# Patient Record
Sex: Male | Born: 1950 | Race: Asian | Hispanic: No | Marital: Married | Smoking: Former smoker
Health system: Southern US, Community
[De-identification: ages and names within clinical notes are randomized; demographics above are authoritative.]

## PROBLEM LIST (undated history)

## (undated) DIAGNOSIS — J40 Bronchitis, not specified as acute or chronic: Secondary | ICD-10-CM

## (undated) DIAGNOSIS — K219 Gastro-esophageal reflux disease without esophagitis: Secondary | ICD-10-CM

## (undated) DIAGNOSIS — I10 Essential (primary) hypertension: Secondary | ICD-10-CM

## (undated) DIAGNOSIS — H269 Unspecified cataract: Secondary | ICD-10-CM

## (undated) DIAGNOSIS — E785 Hyperlipidemia, unspecified: Secondary | ICD-10-CM

## (undated) DIAGNOSIS — J45909 Unspecified asthma, uncomplicated: Secondary | ICD-10-CM

## (undated) DIAGNOSIS — F419 Anxiety disorder, unspecified: Secondary | ICD-10-CM

## (undated) DIAGNOSIS — J449 Chronic obstructive pulmonary disease, unspecified: Secondary | ICD-10-CM

## (undated) DIAGNOSIS — E119 Type 2 diabetes mellitus without complications: Secondary | ICD-10-CM

## (undated) DIAGNOSIS — T7840XA Allergy, unspecified, initial encounter: Secondary | ICD-10-CM

## (undated) HISTORY — DX: Anxiety disorder, unspecified: F41.9

## (undated) HISTORY — PX: COLONOSCOPY: SHX174

## (undated) HISTORY — PX: POLYPECTOMY: SHX149

## (undated) HISTORY — DX: Unspecified cataract: H26.9

## (undated) HISTORY — PX: UPPER GASTROINTESTINAL ENDOSCOPY: SHX188

## (undated) HISTORY — DX: Bronchitis, not specified as acute or chronic: J40

## (undated) HISTORY — DX: Gastro-esophageal reflux disease without esophagitis: K21.9

## (undated) HISTORY — DX: Chronic obstructive pulmonary disease, unspecified: J44.9

## (undated) HISTORY — DX: Hyperlipidemia, unspecified: E78.5

## (undated) HISTORY — DX: Allergy, unspecified, initial encounter: T78.40XA

## (undated) HISTORY — DX: Unspecified asthma, uncomplicated: J45.909

---

## 2011-03-22 HISTORY — PX: CYST REMOVAL NECK: SHX6281

## 2012-06-11 ENCOUNTER — Ambulatory Visit
Admission: RE | Admit: 2012-06-11 | Discharge: 2012-06-11 | Disposition: A | Discharge status: Home / Self Care | Payer: No Typology Code available for payment source | Source: Ambulatory Visit | Attending: Infectious Diseases | Admitting: Infectious Diseases

## 2012-06-11 ENCOUNTER — Other Ambulatory Visit: Payer: Self-pay | Admitting: Infectious Diseases

## 2012-06-11 DIAGNOSIS — R7611 Nonspecific reaction to tuberculin skin test without active tuberculosis: Secondary | ICD-10-CM

## 2012-11-08 ENCOUNTER — Ambulatory Visit: Payer: Self-pay | Attending: Family Medicine

## 2012-11-23 ENCOUNTER — Ambulatory Visit: Payer: Self-pay

## 2012-11-23 ENCOUNTER — Ambulatory Visit: Payer: Self-pay | Attending: Internal Medicine | Admitting: Internal Medicine

## 2012-11-23 ENCOUNTER — Encounter: Payer: Self-pay | Admitting: Internal Medicine

## 2012-11-23 VITALS — BP 120/79 | HR 79 | Temp 97.7°F | Resp 15

## 2012-11-23 DIAGNOSIS — IMO0001 Reserved for inherently not codable concepts without codable children: Secondary | ICD-10-CM | POA: Insufficient documentation

## 2012-11-23 DIAGNOSIS — J449 Chronic obstructive pulmonary disease, unspecified: Secondary | ICD-10-CM

## 2012-11-23 DIAGNOSIS — M545 Low back pain: Secondary | ICD-10-CM | POA: Insufficient documentation

## 2012-11-23 DIAGNOSIS — R030 Elevated blood-pressure reading, without diagnosis of hypertension: Secondary | ICD-10-CM | POA: Insufficient documentation

## 2012-11-23 DIAGNOSIS — F17200 Nicotine dependence, unspecified, uncomplicated: Secondary | ICD-10-CM | POA: Insufficient documentation

## 2012-11-23 LAB — POCT GLYCOSYLATED HEMOGLOBIN (HGB A1C): Hemoglobin A1C: 5.8

## 2012-11-23 MED ORDER — ALBUTEROL SULFATE HFA 108 (90 BASE) MCG/ACT IN AERS: 2.0000 | INHALATION_SPRAY | Freq: Four times a day (QID) | RESPIRATORY_TRACT | Status: DC | PRN

## 2012-11-23 MED ORDER — IBUPROFEN 600 MG PO TABS: 600.0000 mg | ORAL_TABLET | Freq: Three times a day (TID) | ORAL | Status: DC | PRN

## 2012-11-23 NOTE — Progress Notes (Signed)
Patient here with interpreter Takes no medication Here to establish a doctor

## 2012-11-23 NOTE — Progress Notes (Signed)
Patient ID: David Barber, male   DOB: 09/01/1950, 62 y.o.   MRN: [id number]  CC: To establish care  HPI: Patient is a 62 years old [place] man here today to establish medical care. He has no significant past medical history. Her concern today is that of occasional shortness of breath, and low back pain. The low back pain has been there for almost 7 years, but is getting worse in the last 2 months. He remembered he for about 20 years ago when he hurt his back. No recent injury or fall, no history of numbness or weakness in the legs. Shortness of breath is gradually getting worse as well, associated with dry cough. Patient is a heavy smoker but said he is trying to reduce the quantity, he drinks alcohol occasionally. No history of heart disease, denies chest pain, no headache or blurry vision, no leg swelling, no abdominal pain no swelling.  No Known Allergies History reviewed. No pertinent past medical history. No current outpatient prescriptions on file prior to visit.   No current facility-administered medications on file prior to visit.   History reviewed. No pertinent family history. History   Social History  . Marital Status: Married    Spouse Name: N/A    Number of Children: N/A  . Years of Education: N/A   Occupational History  . Not on file.   Social History Main Topics  . Smoking status: Not on file  . Smokeless tobacco: Not on file  . Alcohol Use: Not on file  . Drug Use: Not on file  . Sexual Activity: Not on file   Other Topics Concern  . Not on file   Social History Narrative  . No narrative on file    Review of Systems: Constitutional: Negative for fever, chills, diaphoresis, activity change, appetite change and fatigue. HENT: Negative for ear pain, nosebleeds, congestion, facial swelling, rhinorrhea, neck pain, neck stiffness and ear discharge.  Eyes: Negative for pain, discharge, redness, itching and visual disturbance. Respiratory: Negative for cough,  choking, chest tightness, shortness of breath, wheezing and stridor.  Cardiovascular: Negative for chest pain, palpitations and leg swelling. Gastrointestinal: Negative for abdominal distention. Genitourinary: Negative for dysuria, urgency, frequency, hematuria, flank pain, decreased urine volume, difficulty urinating and dyspareunia.  Musculoskeletal: Negative for back pain, joint swelling, arthralgias and gait problem. Neurological: Negative for dizziness, tremors, seizures, syncope, facial asymmetry, speech difficulty, weakness, light-headedness, numbness and headaches.  Hematological: Negative for adenopathy. Does not bruise/bleed easily. Psychiatric/Behavioral: Negative for hallucinations, behavioral problems, confusion, dysphoric mood, decreased concentration and agitation.    Objective:   Filed Vitals:   11/23/12 1320  BP: 120/79  Pulse: 79  Temp: 97.7 F (36.5 C)  Resp: 15    Physical Exam: Constitutional: Patient appears well-developed and well-nourished. No distress. HENT: Normocephalic, atraumatic, External right and left ear normal. Oropharynx is clear and moist. Nicotine stained teeth Eyes: Conjunctivae and EOM are normal. PERRLA, no scleral icterus. Neck: Normal ROM. Neck supple. No JVD. No tracheal deviation. No thyromegaly. CVS: RRR, S1/S2 +, no murmurs, no gallops, no carotid bruit.  Pulmonary: Effort and breath sounds normal, no stridor, rhonchi, ++ wheezes Abdominal: Soft. BS +,  no distension, tenderness, rebound or guarding.  Musculoskeletal: Normal range of motion. No edema and no tenderness.  Lymphadenopathy: No lymphadenopathy noted, cervical, inguinal or axillary Neuro: Alert. Normal reflexes, muscle tone coordination. No cranial nerve deficit. Skin: Skin is warm and dry. No rash noted. Not diaphoretic. No erythema. No pallor. Psychiatric: Normal mood and  affect. Behavior, judgment, thought content normal.  No results found for this basename: WBC, HGB, HCT,  MCV, PLT   No results found for this basename: CREATININE, BUN, NA, K, CL, CO2    Lab Results  Component Value Date   HGBA1C 5.8 11/23/2012   Lipid Panel  No results found for this basename: chol, trig, hdl, cholhdl, vldl, ldlcalc       Assessment and plan:   Patient Active Problem List   Diagnosis Date Noted  . Low back pain 11/23/2012  . 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 Nicotine dependence 11/23/2012  . Prehypertension 11/23/2012   Labs: CBC D. CMP Lipid panel Complete urinalysis Hemoglobin A1c  X-ray lumbar spine  Tramadol 50 mg tablet by mouth every 6 hour when necessary pain Albuterol inhaler 2 puffs every 6 hour when necessary shortness of breath Patient was extensively counseled about smoking cessation Nutrition and exercise counseling done  Aretha Parrot was given clear instructions to go to ER or return to the clinic if symptoms don't improve, worsen or new problems develop.  Ileene Hutchinson [place] verbalized understanding.  Randale Carvalho [place] was told to call to get lab results if hasn't heard anything in the next week.    Interpreter was used to communicate directly with patient for the entire encounter including providing detailed patient instructions.        Jeanann Lewandowsky, MD [facility] [place], [place] [phone]   11/23/2012, 2:40 PM

## 2012-11-24 LAB — CBC WITH DIFFERENTIAL/PLATELET
Eosinophils Relative: 6 % — ABNORMAL HIGH (ref 0–5)
HCT: 46.5 % (ref 39.0–52.0)
Hemoglobin: 15.7 g/dL (ref 13.0–17.0)
Lymphocytes Relative: 28 % (ref 12–46)
MCV: 83.6 fL (ref 78.0–100.0)
Monocytes Absolute: 0.6 10*3/uL (ref 0.1–1.0)
Monocytes Relative: 7 % (ref 3–12)
Neutro Abs: 5.1 10*3/uL (ref 1.7–7.7)
RDW: 14.5 % (ref 11.5–15.5)
WBC: 8.6 10*3/uL (ref 4.0–10.5)

## 2012-11-24 LAB — URINALYSIS, COMPLETE
Bacteria, UA: NONE SEEN
Bilirubin Urine: NEGATIVE
Crystals: NONE SEEN
Hgb urine dipstick: NEGATIVE
Ketones, ur: NEGATIVE mg/dL
Nitrite: NEGATIVE
Protein, ur: NEGATIVE mg/dL
Urobilinogen, UA: 0.2 mg/dL (ref 0.0–1.0)

## 2012-11-24 LAB — CMP AND LIVER
AST: 24 U/L (ref 0–37)
Albumin: 3.9 g/dL (ref 3.5–5.2)
Alkaline Phosphatase: 96 U/L (ref 39–117)
BUN: 11 mg/dL (ref 6–23)
Glucose, Bld: 107 mg/dL — ABNORMAL HIGH (ref 70–99)
Indirect Bilirubin: 0.4 mg/dL (ref 0.0–0.9)
Potassium: 4.1 mEq/L (ref 3.5–5.3)
Sodium: 138 mEq/L (ref 135–145)
Total Bilirubin: 0.5 mg/dL (ref 0.3–1.2)
Total Protein: 7.3 g/dL (ref 6.0–8.3)

## 2012-11-24 LAB — LIPID PANEL
HDL: 35 mg/dL — ABNORMAL LOW (ref >39)
LDL Cholesterol: 129 mg/dL — ABNORMAL HIGH (ref 0–99)
Total CHOL/HDL Ratio: 5.9 Ratio
Triglycerides: 215 mg/dL — ABNORMAL HIGH (ref <150)
VLDL: 43 mg/dL — ABNORMAL HIGH (ref 0–40)

## 2012-11-26 ENCOUNTER — Ambulatory Visit (HOSPITAL_COMMUNITY)
Admission: RE | Admit: 2012-11-26 | Discharge: 2012-11-26 | Disposition: A | Discharge status: Home / Self Care | Payer: Self-pay | Source: Ambulatory Visit | Attending: Internal Medicine | Admitting: Internal Medicine

## 2012-11-26 DIAGNOSIS — IMO0001 Reserved for inherently not codable concepts without codable children: Secondary | ICD-10-CM

## 2012-11-26 DIAGNOSIS — W19XXXA Unspecified fall, initial encounter: Secondary | ICD-10-CM | POA: Insufficient documentation

## 2012-11-26 DIAGNOSIS — F17200 Nicotine dependence, unspecified, uncomplicated: Secondary | ICD-10-CM

## 2012-11-26 DIAGNOSIS — M5137 Other intervertebral disc degeneration, lumbosacral region: Secondary | ICD-10-CM | POA: Insufficient documentation

## 2012-11-26 DIAGNOSIS — R030 Elevated blood-pressure reading, without diagnosis of hypertension: Secondary | ICD-10-CM

## 2012-11-26 DIAGNOSIS — M545 Low back pain, unspecified: Secondary | ICD-10-CM | POA: Insufficient documentation

## 2012-11-26 DIAGNOSIS — M51379 Other intervertebral disc degeneration, lumbosacral region without mention of lumbar back pain or lower extremity pain: Secondary | ICD-10-CM | POA: Insufficient documentation

## 2012-11-29 ENCOUNTER — Telehealth: Payer: Self-pay

## 2012-11-29 NOTE — Telephone Encounter (Signed)
Used interpreter line Results given to daughter She will relay the message

## 2012-11-29 NOTE — Telephone Encounter (Signed)
Message copied by Lestine Mount on Thu Nov 29, 2012  1:34 PM ------      Message from: Jeanann Lewandowsky E      Created: Thu Nov 29, 2012  9:37 AM       Please call to inform patient that the x-ray of the lumbar spine shows minimal degenerative disc disease but no acute lumbar spine abnormality. ------

## 2012-12-21 ENCOUNTER — Encounter: Payer: Self-pay | Admitting: Family Medicine

## 2012-12-21 ENCOUNTER — Ambulatory Visit: Payer: Self-pay | Attending: Internal Medicine | Admitting: Family Medicine

## 2012-12-21 VITALS — BP 117/85 | HR 84 | Temp 98.3°F | Resp 16 | Ht 65.0 in | Wt 160.0 lb

## 2012-12-21 DIAGNOSIS — J449 Chronic obstructive pulmonary disease, unspecified: Secondary | ICD-10-CM

## 2012-12-21 DIAGNOSIS — E119 Type 2 diabetes mellitus without complications: Secondary | ICD-10-CM | POA: Insufficient documentation

## 2012-12-21 DIAGNOSIS — R7309 Other abnormal glucose: Secondary | ICD-10-CM

## 2012-12-21 DIAGNOSIS — M545 Low back pain, unspecified: Secondary | ICD-10-CM

## 2012-12-21 DIAGNOSIS — R7303 Prediabetes: Secondary | ICD-10-CM

## 2012-12-21 DIAGNOSIS — IMO0001 Reserved for inherently not codable concepts without codable children: Secondary | ICD-10-CM

## 2012-12-21 DIAGNOSIS — J4489 Other specified chronic obstructive pulmonary disease: Secondary | ICD-10-CM

## 2012-12-21 DIAGNOSIS — F17200 Nicotine dependence, unspecified, uncomplicated: Secondary | ICD-10-CM

## 2012-12-21 NOTE — Patient Instructions (Signed)
Diabetes, Type 2, Am I At Risk? Diabetes is a lasting (chronic) disease. In type 2 diabetes, the pancreas does not make enough insulin, and the body does not respond normally to the insulin that is made. This type of diabetes was also previously called adult onset diabetes. About 90% of all those who have diabetes have type 2. It usually occurs after the age of 34, but can occur at any age.  People develop type 2 diabetes because they do not use insulin properly. Eventually, the pancreas cannot make enough insulin for the body's needs. Over time, the amount of glucose (sugar) in the blood increases. RISK FACTORS  Overweight  the more weight you have, the more resistant your cells become to insulin.  Family history  you are more likely to get diabetes if a parent or sibling has diabetes.  Race certain races get diabetes more.  African Americans.  American Indians.  Asian Americans.  Hispanics.  Pacific Islander.  Inactive exercise helps control weight and helps your cells be more sensitive to insulin.  Gestational diabetes  some women develop diabetes while they are pregnant. This goes away when they deliver. However, they are 50-60% more likely to develop type 2 diabetes at a later time.  Having a baby over 9 pounds  a sign that you may have had gestational diabetes.  Age the risk of diabetes goes up as you get older, especially after age 18.  High blood pressure (hypertension). SYMPTOMS Many people have no signs or symptoms. Symptoms can be so mild that you might not even notice them. Some of these signs are:  Increased thirst.  Increased hunger.  Tiredness (fatigue).  Increased urination, especially at night.  Weight loss.  Blurred vision.  Sores that do not heal. WHO SHOULD BE TESTED?  Anyone 25 years or older, especially if overweight, should consider getting tested.  If you are younger than 56, overweight, and have one or more of the risk factors, you should  consider getting tested. DIAGNOSIS  Fasting blood glucose (FBS). Usually, 2 are done.  FBS 101-125 mg/dl is considered pre-diabetes.  FBS 126 mg/dl or greater is considered diabetes.  2 hour Oral Glucose Tolerance Test (OGTT). This test is preformed by first having you not eat or drink for several hours. You are then given something sweet to drink and your blood glucose is measured fasting, at one hour and 2 hours. This test tells how well you are able to handle sugars or carbohydrates.  Fasting: 60-100 mg/dl.  1 hour: less than 200 mg/dl.  2 hours: less than 140 mg/dl.  A1c A1c is a blood glucose test that gives and average of your blood glucose over 3 months. It is the accepted method to use to diagnose diabetes.  A1c 5.7-6.4% is considered pre-diabetes.  A1c 6.5% or greater is considered diabetes. WHAT DOES IT MEAN TO HAVE PRE-DIABETES? Pre-diabetes means you are at risk for getting type 2 diabetes. Your blood glucose is higher than normal, but not yet high enough to diagnose diabetes. The good news is, if you have pre-diabetes you can reduce the risk of getting diabetes and even return to normal blood glucose levels. With modest weight loss and moderate physical activity, you can delay or prevent type 2 diabetes.  PREVENTION You cannot do anything about race, age or family history, but you can lower your chances of getting diabetes. You can:   Exercise regularly and be active.  Reduce fat and calorie intake.  Make wise food  choices as much as you can.  Reduce your intake of salt and alcohol.  Maintain a reasonable weight.  Keep blood pressure in an acceptable range. Take medication if needed.  Not smoke.  Maintain an acceptable cholesterol level (HDL, LDL, Triglycerides). Take medication if needed. DOING MY PART: GETTING STARTED Making big changes in your life is hard, especially if you are faced with more than one change. You can make it easier by taking these  steps:  Make a plan to change behavior.  Decide exactly what you will do and when you will do it.  Plan what you need to get ready.  Think about what might prevent you from reaching your goals.  Find family and friends who will support and encourage you.  Decide how you will reward yourself when you do what you have planned.  Your doctor, dietitian, or counselor can help you make a plan. HERE ARE SOME OF THE AREAS YOU MAY WISH TO CHANGE TO REDUCE YOUR RISK OF DIABETES. If you are overweight or obese, choose sensible ways to get in shape. Even small amounts of weight loss, like 5-10 pounds, can help reduce the effects of insulin resistance and help blood glucose control. Diet  Avoid crash diets. Instead, eat less of the foods you usually have. Limit the amount of fat you eat.  Increase your physical activity. Aim for at least 30 minutes of exercise most days of the week.  Set a reasonable weight-loss goal, such as losing 1 pound a week. Aim for a long-term goal of losing 5-7% of your total body weight.  Make wise food choices most of the time.  What you eat has a big impact on your health. By making wise food choices, you can help control your body weight, blood pressure, and cholesterol.  Take a hard look at the serving sizes of the foods you eat. Reduce serving sizes of meat, desserts, and foods high in fat. Increase your intake of fruits and vegetables.  Limit your fat intake to about 25% of your total calories. For example, if your food choices add up to about 2,000 calories a day, try to eat no more than 56 grams of fat. Your caregiver or a dietitian can help you figure out how much fat to have. You can check food labels for fat content too.  You may also want to reduce the number of calories you have each day.  Keep a food log. Write down what you eat, how much you eat, and anything else that helps keep you on track.  When you meet your goal, reward yourself with a nonfood  item or activity. Exercise  Be physically active every day.  Keep and exercise log. Write down what exercise you did, for how long, and anything else that keeps you on track.  Regular exercise (like brisk walking) tackles several risk factors at once. It helps you lose weight, it keeps your cholesterol and blood pressure under control, and it helps your body use insulin. People who are physically active for 30 minutes a day, 5 days a week, reduced their risk of type 2 diabetes. If you are not very active, you should start slowly at first. Talk with your caregiver first about what kinds of exercise would be safe for you. Make a plan to increase your activity level with the goal of being active for at least 30 minutes a day, most days of the week.  Choose activities you enjoy. Here are some ways to  work extra activity into your daily routine:  Take the stairs rather than an elevator or escalator.  Park at the far end of the lot and walk.  Get off the bus a few stops early and walk the rest of the way.  Walk or bicycle instead of drive whenever you can. Medications Some people need medication to help control their blood pressure or cholesterol levels. If you do, take your medicines as directed. Ask your caregiver whether there are any medicines you can take to prevent type 2 diabetes. Document Released: 03/10/2003 Document Revised: 05/30/2011 Document Reviewed: 12/03/2008 [facility] Patient Information 2014 [place], [place]. Smoking Cessation Quitting smoking is important to your health and has many advantages. However, it is not always easy to quit since nicotine is a very addictive drug. Often times, people try 3 times or more before being able to quit. This document explains the best ways for you to prepare to quit smoking. Quitting takes hard work and a lot of effort, but you can do it. ADVANTAGES OF QUITTING SMOKING  You will live longer, feel better, and live better.  Your body will feel  the impact of quitting smoking almost immediately.  Within 20 minutes, blood pressure decreases. Your pulse returns to its normal level.  After 8 hours, carbon monoxide levels in the blood return to normal. Your oxygen level increases.  After 24 hours, the chance of having a heart attack starts to decrease. Your breath, hair, and body stop smelling like smoke.  After 48 hours, damaged nerve endings begin to recover. Your sense of taste and smell improve.  After 72 hours, the body is virtually free of nicotine. Your bronchial tubes relax and breathing becomes easier.  After 2 to 12 weeks, lungs can hold more air. Exercise becomes easier and circulation improves.  The risk of having a heart attack, stroke, cancer, or lung disease is greatly reduced.  After 1 year, the risk of coronary heart disease is cut in half.  After 5 years, the risk of stroke falls to the same as a nonsmoker.  After 10 years, the risk of lung cancer is cut in half and the risk of other cancers decreases significantly.  After 15 years, the risk of coronary heart disease drops, usually to the level of a nonsmoker.  If you are pregnant, quitting smoking will improve your chances of having a healthy baby.  The people you live with, especially any children, will be healthier.  You will have extra money to spend on things other than cigarettes. QUESTIONS TO THINK ABOUT BEFORE ATTEMPTING TO QUIT You may want to talk about your answers with your caregiver.  Why do you want to quit?  If you tried to quit in the past, what helped and what did not?  What will be the most difficult situations for you after you quit? How will you plan to handle them?  Who can help you through the tough times? Your family? Friends? A caregiver?  What pleasures do you get from smoking? What ways can you still get pleasure if you quit? Here are some questions to ask your caregiver:  How can you help me to be successful at  quitting?  What medicine do you think would be best for me and how should I take it?  What should I do if I need more help?  What is smoking withdrawal like? How can I get information on withdrawal? GET READY  Set a quit date.  Change your environment by getting  rid of all cigarettes, ashtrays, matches, and lighters in your home, car, or work. Do not let people smoke in your home.  Review your past attempts to quit. Think about what worked and what did not. GET SUPPORT AND ENCOURAGEMENT You have a better chance of being successful if you have help. You can get support in many ways.  Tell your family, friends, and co-workers that you are going to quit and need their support. Ask them not to smoke around you.  Get individual, group, or telephone counseling and support. Programs are available at [employer] and health centers. Call your local health department for information about programs in your area.  Spiritual beliefs and practices may help some smokers quit.  Download a "quit meter" on your computer to keep track of quit statistics, such as how long you have gone without smoking, cigarettes not smoked, and money saved.  Get a self-help book about quitting smoking and staying off of tobacco. LEARN NEW SKILLS AND BEHAVIORS  Distract yourself from urges to smoke. Talk to someone, go for a walk, or occupy your time with a task.  Change your normal routine. Take a different route to work. Drink tea instead of coffee. Eat breakfast in a different place.  Reduce your stress. Take a hot bath, exercise, or read a book.  Plan something enjoyable to do every day. Reward yourself for not smoking.  Explore interactive web-based programs that specialize in helping you quit. GET MEDICINE AND USE IT CORRECTLY Medicines can help you stop smoking and decrease the urge to smoke. Combining medicine with the above behavioral methods and support can greatly increase your chances of  successfully quitting smoking.  Nicotine replacement therapy helps deliver nicotine to your body without the negative effects and risks of smoking. Nicotine replacement therapy includes nicotine gum, lozenges, inhalers, nasal sprays, and skin patches. Some may be available over-the-counter and others require a prescription.  Antidepressant medicine helps people abstain from smoking, but how this works is unknown. This medicine is available by prescription.  Nicotinic receptor partial agonist medicine simulates the effect of nicotine in your brain. This medicine is available by prescription. Ask your caregiver for advice about which medicines to use and how to use them based on your health history. Your caregiver will tell you what side effects to look out for if you choose to be on a medicine or therapy. Carefully read the information on the package. Do not use any other product containing nicotine while using a nicotine replacement product.  RELAPSE OR DIFFICULT SITUATIONS Most relapses occur within the first 3 months after quitting. Do not be discouraged if you start smoking again. Remember, most people try several times before finally quitting. You may have symptoms of withdrawal because your body is used to nicotine. You may crave cigarettes, be irritable, feel very hungry, cough often, get headaches, or have difficulty concentrating. The withdrawal symptoms are only temporary. They are strongest when you first quit, but they will go away within 10 14 days. To reduce the chances of relapse, try to:  Avoid drinking alcohol. Drinking lowers your chances of successfully quitting.  Reduce the amount of caffeine you consume. Once you quit smoking, the amount of caffeine in your body increases and can give you symptoms, such as a rapid heartbeat, sweating, and anxiety.  Avoid smokers because they can make you want to smoke.  Do not let weight gain distract you. Many smokers will gain weight when they  quit, usually less than  10 pounds. Eat a healthy diet and stay active. You can always lose the weight gained after you quit.  Find ways to improve your mood other than smoking. FOR MORE INFORMATION  www.smokefree.gov  Document Released: 03/01/2001 Document Revised: 09/06/2011 Document Reviewed: 06/16/2011 [facility] Patient Information 2014 [place], [place]. Smoking Cessation, Tips for Success YOU CAN QUIT SMOKING If you are ready to quit smoking, congratulations! You have chosen to help yourself be healthier. Cigarettes bring nicotine, tar, carbon monoxide, and other irritants into your body. Your lungs, heart, and blood vessels will be able to work better without these poisons. There are many different ways to quit smoking. Nicotine gum, nicotine patches, a nicotine inhaler, or nicotine nasal spray can help with physical craving. Hypnosis, support groups, and medicines help break the habit of smoking. Here are some tips to help you quit for good.  Throw away all cigarettes.  Clean and remove all ashtrays from your home, work, and car.  On a card, write down your reasons for quitting. Carry the card with you and read it when you get the urge to smoke.  Cleanse your body of nicotine. Drink enough water and fluids to keep your urine clear or pale yellow. Do this after quitting to flush the nicotine from your body.  Learn to predict your moods. Do not let a bad situation be your excuse to have a cigarette. Some situations in your life might tempt you into wanting a cigarette.  Never have "just one" cigarette. It leads to wanting another and another. Remind yourself of your decision to quit.  Change habits associated with smoking. If you smoked while driving or when feeling stressed, try other activities to replace smoking. Stand up when drinking your coffee. Brush your teeth after eating. Sit in a different chair when you read the paper. Avoid alcohol while trying to quit, and try to drink fewer  caffeinated beverages. Alcohol and caffeine may urge you to smoke.  Avoid foods and drinks that can trigger a desire to smoke, such as sugary or spicy foods and alcohol.  Ask people who smoke not to smoke around you.  Have something planned to do right after eating or having a cup of coffee. Take a walk or exercise to perk you up. This will help to keep you from overeating.  Try a relaxation exercise to calm you down and decrease your stress. Remember, you may be tense and nervous for the first 2 weeks after you quit, but this will pass.  Find new activities to keep your hands busy. Play with a pen, coin, or rubber band. Doodle or draw things on paper.  Brush your teeth right after eating. This will help cut down on the craving for the taste of tobacco after meals. You can try mouthwash, too.  Use oral substitutes, such as lemon drops, carrots, a cinnamon stick, or chewing gum, in place of cigarettes. Keep them handy so they are available when you have the urge to smoke.  When you have the urge to smoke, try deep breathing.  Designate your home as a nonsmoking area.  If you are a heavy smoker, ask your caregiver about a prescription for nicotine chewing gum. It can ease your withdrawal from nicotine.  Reward yourself. Set aside the cigarette money you save and buy yourself something nice.  Look for support from others. Join a support group or smoking cessation program. Ask someone at home or at work to help you with your plan to quit smoking.  Always ask yourself, "Do I need this cigarette or is this just a reflex?" Tell yourself, "Today, I choose not to smoke," or "I do not want to smoke." You are reminding yourself of your decision to quit, even if you do smoke a cigarette. HOW WILL I FEEL WHEN I QUIT SMOKING?  The benefits of not smoking start within days of quitting.  You may have symptoms of withdrawal because your body is used to nicotine (the addictive substance in cigarettes).  You may crave cigarettes, be irritable, feel very hungry, cough often, get headaches, or have difficulty concentrating.  The withdrawal symptoms are only temporary. They are strongest when you first quit but will go away within 10 to 14 days.  When withdrawal symptoms occur, stay in control. Think about your reasons for quitting. Remind yourself that these are signs that your body is healing and getting used to being without cigarettes.  Remember that withdrawal symptoms are easier to treat than the major diseases that smoking can cause.  Even after the withdrawal is over, expect periodic urges to smoke. However, these cravings are generally short-lived and will go away whether you smoke or not. Do not smoke!  If you relapse and smoke again, do not lose hope. Most smokers quit 3 times before they are successful.  If you relapse, do not give up! Plan ahead and think about what you will do the next time you get the urge to smoke. LIFE AS A NONSMOKER: MAKE IT FOR A MONTH, MAKE IT FOR LIFE Day 1: Hang this page where you will see it every day. Day 2: Get rid of all ashtrays, matches, and lighters. Day 3: Drink water. Breathe deeply between sips. Day 4: Avoid places with smoke-filled air, such as bars, clubs, or the smoking section of restaurants. Day 5: Keep track of how much money you save by not smoking. Day 6: Avoid boredom. Keep a good book with you or go to the movies. Day 7: Reward yourself! One week without smoking! Day 8: Make a dental appointment to get your teeth cleaned. Day 9: Decide how you will turn down a cigarette before it is offered to you. Day 10: Review your reasons for quitting. Day 11: Distract yourself. Stay active to keep your mind off smoking and to relieve tension. Take a walk, exercise, read a book, do a crossword puzzle, or try a new hobby. Day 12: Exercise. Get off the bus before your stop or use stairs instead of escalators. Day 13: Call on friends for support and  encouragement. Day 14: Reward yourself! Two weeks without smoking! Day 15: Practice deep breathing exercises. Day 16: Bet a friend that you can stay a nonsmoker. Day 17: Ask to sit in nonsmoking sections of restaurants. Day 18: Hang up "No Smoking" signs. Day 19: Think of yourself as a nonsmoker. Day 20: Each morning, tell yourself you will not smoke. Day 21: Reward yourself! Three weeks without smoking! Day 22: Think of smoking in negative ways. Remember how it stains your teeth, gives you bad breath, and leaves you short of breath. Day 23: Eat a nutritious breakfast. Day 24:Do not relive your days as a smoker. Day 25: Hold a pencil in your hand when talking on the telephone. Day 26: Tell all your friends you do not smoke. Day 27: Think about how much better food tastes. Day 28: Remember, one cigarette is one too many. Day 29: Take up a hobby that will keep your hands busy. Day 30: Congratulations!  One month without smoking! Give yourself a big reward. Your caregiver can direct you to community resources or hospitals for support, which may include:  Group support.  Education.  Hypnosis.  Subliminal therapy. Document Released: 12/04/2003 Document Revised: 05/30/2011 Document Reviewed: 12/22/2008 [facility] Patient Information 2014 [place], [place].

## 2012-12-21 NOTE — Progress Notes (Signed)
PT HERE POST F/U COPD,BACK PAIN PT NOW C/O LEFT NECK DISCOMFORT,NONRADIATING X 1 WEEK DENIES NUMBNESS SPEAKS ONLY NEPALI/TRANSLATOR PRESENT

## 2012-12-21 NOTE — Progress Notes (Signed)
Patient ID: David Barber, male   DOB: 03-Jun-1950, 62 y.o.   MRN: [id number]  CC: followup  Interpreter used to communicate directly with patient for entire encounter including providing detailed patient instructions  HPI: Pt says that his low back pain is better.  He has not stopped smoking.  He says that he is trying really hard to stop smoking.  He is also reporting that he is unsure if he had a flu vaccine this season.  No SOB or chest pain reported.   No Known Allergies History reviewed. No pertinent past medical history. Current Outpatient Prescriptions on File Prior to Visit  Medication Sig Dispense Refill  . albuterol (PROVENTIL HFA;VENTOLIN HFA) 108 (90 BASE) MCG/ACT inhaler Inhale 2 puffs into the lungs every 6 (six) hours as needed for wheezing.  1 Inhaler  2  . ibuprofen (ADVIL,MOTRIN) 600 MG tablet Take 1 tablet (600 mg total) by mouth every 8 (eight) hours as needed for pain.  30 tablet  0   No current facility-administered medications on file prior to visit.   History reviewed. No pertinent family history. History   Social History  . Marital Status: Married    Spouse Name: N/A    Number of Children: N/A  . Years of Education: N/A   Occupational History  . Not on file.   Social History Main Topics  . Smoking status: Current Every Day Smoker  . Smokeless tobacco: Not on file  . Alcohol Use: Not on file  . Drug Use: Not on file  . Sexual Activity: Not on file   Other Topics Concern  . Not on file   Social History Narrative  . No narrative on file    Review of Systems  Constitutional: Negative for fever, chills, diaphoresis, activity change, appetite change and fatigue.  HENT: Negative for ear pain, nosebleeds, congestion, facial swelling, rhinorrhea, neck pain, neck stiffness and ear discharge.   Eyes: Negative for pain, discharge, redness, itching and visual disturbance.  Respiratory: Negative for cough, choking, chest tightness, shortness of breath,  wheezing and stridor.   Cardiovascular: Negative for chest pain, palpitations and leg swelling.  Gastrointestinal: Negative for abdominal distention.  Genitourinary: Negative for dysuria, urgency, frequency, hematuria, flank pain, decreased urine volume, difficulty urinating and dyspareunia.  Musculoskeletal: Negative for back pain, joint swelling, arthralgias and gait problem.  Neurological: Negative for dizziness, tremors, seizures, syncope, facial asymmetry, speech difficulty, weakness, light-headedness, numbness and headaches.  Hematological: Negative for adenopathy. Does not bruise/bleed easily.  Psychiatric/Behavioral: Negative for hallucinations, behavioral problems, confusion, dysphoric mood, decreased concentration and agitation.    Objective:   Filed Vitals:   12/21/12 1015  BP: 117/85  Pulse: 84  Temp: 98.3 F (36.8 C)  Resp: 16    Physical Exam  Constitutional: Appears well-developed and well-nourished. No distress.  HENT: Normocephalic. External right and left ear normal. Oropharynx is clear and moist.  Eyes: Conjunctivae and EOM are normal. PERRLA, no scleral icterus.  Neck: Normal ROM. Neck supple. No JVD. No tracheal deviation. No thyromegaly.  CVS: RRR, S1/S2 +, no murmurs, no gallops, no carotid bruit.  Pulmonary: Effort and breath sounds normal, no stridor, rhonchi, wheezes, rales.  Abdominal: Soft. BS +,  no distension, tenderness, rebound or guarding.  Musculoskeletal: Normal range of motion. No edema and no tenderness.  Lymphadenopathy: No lymphadenopathy noted, cervical, inguinal. Neuro: Alert. Normal reflexes, muscle tone coordination. No cranial nerve deficit. Skin: Skin is warm and dry. No rash noted. Not diaphoretic. No erythema. No pallor.  Psychiatric: Normal mood and affect. Behavior, judgment, thought content normal.   Lab Results  Component Value Date   WBC 8.6 11/23/2012   HGB 15.7 11/23/2012   HCT 46.5 11/23/2012   MCV 83.6 11/23/2012   PLT 234  11/23/2012   Lab Results  Component Value Date   CREATININE 0.70 11/23/2012   BUN 11 11/23/2012   NA 138 11/23/2012   K 4.1 11/23/2012   CL 103 11/23/2012   CO2 27 11/23/2012    Lab Results  Component Value Date   HGBA1C 5.8 11/23/2012   Lipid Panel     Component Value Date/Time   CHOL 207* 11/23/2012 1355   TRIG 215* 11/23/2012 1355   HDL 35* 11/23/2012 1355   CHOLHDL 5.9 11/23/2012 1355   VLDL 43* 11/23/2012 1355   LDLCALC 129* 11/23/2012 1355     Assessment and plan:   Patient Active Problem List   Diagnosis Date Noted  . Low back pain 11/23/2012  . 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 Nicotine dependence 11/23/2012  . Prehypertension 11/23/2012   The patient was counseled on the dangers of tobacco use, and was advised to quit.  Reviewed strategies to maximize success, including removing cigarettes and smoking materials from environment, stress management and substitution of other forms of reinforcement. Counseled on prediabetes diet and exercise Pt declined flu vaccine today  RTC in 4 months  The patient was given clear instructions to go to ER or return to medical center if symptoms don't improve, worsen or new problems develop.  The patient verbalized understanding.  The patient was told to call to get any lab results if not heard anything in the next week.    Rodney Langton, MD, CDE, FAAFP [facility] [place], [place]

## 2013-03-26 ENCOUNTER — Ambulatory Visit: Payer: Self-pay | Admitting: Internal Medicine

## 2013-04-15 ENCOUNTER — Ambulatory Visit: Payer: No Typology Code available for payment source | Attending: Internal Medicine

## 2013-05-30 ENCOUNTER — Ambulatory Visit: Payer: No Typology Code available for payment source | Attending: Internal Medicine | Admitting: Family Medicine

## 2013-05-30 ENCOUNTER — Encounter: Payer: Self-pay | Admitting: Family Medicine

## 2013-05-30 VITALS — BP 107/72 | HR 81 | Temp 98.3°F | Resp 14 | Ht 65.0 in | Wt 162.0 lb

## 2013-05-30 DIAGNOSIS — M25519 Pain in unspecified shoulder: Secondary | ICD-10-CM | POA: Insufficient documentation

## 2013-05-30 DIAGNOSIS — M25511 Pain in right shoulder: Secondary | ICD-10-CM

## 2013-05-30 MED ORDER — MELOXICAM 7.5 MG PO TABS: ORAL_TABLET | ORAL | Status: DC

## 2013-05-30 NOTE — Patient Instructions (Signed)
Shoulder Exercises - all twice daily, 5- 10 reps as tolerated EXERCISES  RANGE OF MOTION (ROM) AND STRETCHING EXERCISES These exercises may help you when beginning to rehabilitate your injury. Your symptoms may resolve with or without further involvement from your physician, physical therapist or athletic trainer. While completing these exercises, remember:   Restoring tissue flexibility helps normal motion to return to the joints. This allows healthier, less painful movement and activity.  An effective stretch should be held for at least 30 seconds.  A stretch should never be painful. You should only feel a gentle lengthening or release in the stretched tissue. ROM - Pendulum  Bend at the waist so that your right / left arm falls away from your body. Support yourself with your opposite hand on a solid surface, such as a table or a countertop.  Your right / left arm should be perpendicular to the ground. If it is not perpendicular, you need to lean over farther. Relax the muscles in your right / left arm and shoulder as much as possible.  Gently sway your hips and trunk so they move your right / left arm without any use of your right / left shoulder muscles.  Progress your movements so that your right / left arm moves side to side, then forward and backward, and finally, both clockwise and counterclockwise.  Complete __________ repetitions in each direction. Many people use this exercise to relieve discomfort in their shoulder as well as to gain range of motion. Repeat __________ times. Complete this exercise __________ times per day. STRETCH  Flexion, Standing  Stand with good posture. With an underhand grip on your right / left hand and an overhand grip on the opposite hand, grasp a broomstick or cane so that your hands are a little more than shoulder-width apart.  Keeping your right / left elbow straight and shoulder muscles relaxed, push the stick with your opposite hand to raise your  right / left arm in front of your body and then overhead. Raise your arm until you feel a stretch in your right / left shoulder, but before you have increased shoulder pain.  Try to avoid shrugging your right / left shoulder as your arm rises by keeping your shoulder blade tucked down and toward your mid-back spine. Hold __________ seconds.  Slowly return to the starting position. Repeat __________ times. Complete this exercise __________ times per day. STRETCH - Internal Rotation  Place your right / left hand behind your back, palm-up.  Throw a towel or belt over your opposite shoulder. Grasp the towel/belt with your right / left hand.  While keeping an upright posture, gently pull up on the towel/belt until you feel a stretch in the front of your right / left shoulder.  Avoid shrugging your right / left shoulder as your arm rises by keeping your shoulder blade tucked down and toward your mid-back spine.  Hold __________. Release the stretch by lowering your opposite hand. Repeat __________ times. Complete this exercise __________ times per day. STRETCH - External Rotation and Abduction  Stagger your stance through a doorframe. It does not matter which foot is forward.  As instructed by your physician, physical therapist or athletic trainer, place your hands:  And forearms above your head and on the door frame.  And forearms at head-height and on the door frame.  At elbow-height and on the door frame.  Keeping your head and chest upright and your stomach muscles tight to prevent over-extending your low-back, slowly shift  your weight onto your front foot until you feel a stretch across your chest and/or in the front of your shoulders.  Hold __________ seconds. Shift your weight to your back foot to release the stretch. Repeat __________ times. Complete this stretch __________ times per day.  STRENGTHENING EXERCISES  These exercises may help you when beginning to rehabilitate your  injury. They may resolve your symptoms with or without further involvement from your physician, physical therapist or athletic trainer. While completing these exercises, remember:   Muscles can gain both the endurance and the strength needed for everyday activities through controlled exercises.  Complete these exercises as instructed by your physician, physical therapist or athletic trainer. Progress the resistance and repetitions only as guided.  You may experience muscle soreness or fatigue, but the pain or discomfort you are trying to eliminate should never worsen during these exercises. If this pain does worsen, stop and make certain you are following the directions exactly. If the pain is still present after adjustments, discontinue the exercise until you can discuss the trouble with your clinician.  If advised by your physician, during your recovery, avoid activity or exercises which involve actions that place your right / left hand or elbow above your head or behind your back or head. These positions stress the tissues which are trying to heal. STRENGTH - Scapular Depression and Adduction  With good posture, sit on a firm chair. Supported your arms in front of you with pillows, arm rests or a table top. Have your elbows in line with the sides of your body.  Gently draw your shoulder blades down and toward your mid-back spine. Gradually increase the tension without tensing the muscles along the top of your shoulders and the back of your neck.  Hold for __________ seconds. Slowly release the tension and relax your muscles completely before completing the next repetition.  After you have practiced this exercise, remove the arm support and complete it in standing as well as sitting. Repeat __________ times. Complete this exercise __________ times per day.  STRENGTH - External Rotators  Secure a rubber exercise band/tubing to a fixed object so that it is at the same height as your right / left  elbow when you are standing or sitting on a firm surface.  Stand or sit so that the secured exercise band/tubing is at your side that is not injured.  Bend your elbow 90 degrees. Place a folded towel or small pillow under your right / left arm so that your elbow is a few inches away from your side.  Keeping the tension on the exercise band/tubing, pull it away from your body, as if pivoting on your elbow. Be sure to keep your body steady so that the movement is only coming from your shoulder rotating.  Hold __________ seconds. Release the tension in a controlled manner as you return to the starting position. Repeat __________ times. Complete this exercise __________ times per day.  STRENGTH - Supraspinatus  Stand or sit with good posture. Grasp a __________ weight or an exercise band/tubing so that your hand is "thumbs-up," like when you shake hands.  Slowly lift your right / left hand from your thigh into the air, traveling about 30 degrees from straight out at your side. Lift your hand to shoulder height or as far as you can without increasing any shoulder pain. Initially, many people do not lift their hands above shoulder height.  Avoid shrugging your right / left shoulder as your arm rises by  keeping your shoulder blade tucked down and toward your mid-back spine.  Hold for __________ seconds. Control the descent of your hand as you slowly return to your starting position. Repeat __________ times. Complete this exercise __________ times per day.  STRENGTH - Shoulder Extensors  Secure a rubber exercise band/tubing so that it is at the height of your shoulders when you are either standing or sitting on a firm arm-less chair.  With a thumbs-up grip, grasp an end of the band/tubing in each hand. Straighten your elbows and lift your hands straight in front of you at shoulder height. Step back away from the secured end of band/tubing until it becomes tense.  Squeezing your shoulder blades  together, pull your hands down to the sides of your thighs. Do not allow your hands to go behind you.  Hold for __________ seconds. Slowly ease the tension on the band/tubing as you reverse the directions and return to the starting position. Repeat __________ times. Complete this exercise __________ times per day.  STRENGTH - Scapular Retractors  Secure a rubber exercise band/tubing so that it is at the height of your shoulders when you are either standing or sitting on a firm arm-less chair.  With a palm-down grip, grasp an end of the band/tubing in each hand. Straighten your elbows and lift your hands straight in front of you at shoulder height. Step back away from the secured end of band/tubing until it becomes tense.  Squeezing your shoulder blades together, draw your elbows back as you bend them. Keep your upper arm lifted away from your body throughout the exercise.  Hold __________ seconds. Slowly ease the tension on the band/tubing as you reverse the directions and return to the starting position. Repeat __________ times. Complete this exercise __________ times per day. STRENGTH  Scapular Depressors  Find a sturdy chair without wheels, such as a from a dining room table.  Keeping your feet on the floor, lift your bottom from the seat and lock your elbows.  Keeping your elbows straight, allow gravity to pull your body weight down. Your shoulders will rise toward your ears.  Raise your body against gravity by drawing your shoulder blades down your back, shortening the distance between your shoulders and ears. Although your feet should always maintain contact with the floor, your feet should progressively support less body weight as you get stronger.  Hold __________ seconds. In a controlled and slow manner, lower your body weight to begin the next repetition. Repeat __________ times. Complete this exercise __________ times per day.  Document Released: 01/19/2005 Document Revised:  05/30/2011 Document Reviewed: 06/19/2008 [facility] Patient Information 2014 [place], [place].

## 2013-05-30 NOTE — Progress Notes (Signed)
   Subjective:    Patient ID: David Barber, male    DOB: 1951/01/05, 63 y.o.   MRN: [id number]  HPI Pt here with right shoulder pain that has been getting worse over the past few mos. He has been using his arm less and feels he has decreased rom and decreased strength due to not using. There was no trauma or inciting event. He has had other issues with joints in the past.    Review of Systems A 12 point review of systems is negative except as per hpi.       Objective:   Physical Exam  Musculoskeletal:       Right shoulder: He exhibits decreased range of motion, tenderness, crepitus and pain. He exhibits no bony tenderness, no swelling, no effusion, no deformity, no laceration, no spasm, normal pulse and normal strength.          Assessment & Plan:  David Barber was seen today for follow-up.  Diagnoses and associated orders for this visit:  Right shoulder pain - DG Shoulder Right; Future - meloxicam (MOBIC) 7.5 MG tablet; 1-2 tabs by mouth daily Suspect OA

## 2013-05-30 NOTE — Progress Notes (Signed)
For three months pt has been having right shoulder pain that is extreme at night time.

## 2013-06-03 ENCOUNTER — Ambulatory Visit (HOSPITAL_COMMUNITY)
Admission: RE | Admit: 2013-06-03 | Discharge: 2013-06-03 | Disposition: A | Discharge status: Home / Self Care | Payer: No Typology Code available for payment source | Source: Ambulatory Visit | Attending: Family Medicine | Admitting: Family Medicine

## 2013-06-03 DIAGNOSIS — M25519 Pain in unspecified shoulder: Secondary | ICD-10-CM | POA: Insufficient documentation

## 2013-06-03 DIAGNOSIS — M25511 Pain in right shoulder: Secondary | ICD-10-CM

## 2013-06-06 ENCOUNTER — Telehealth: Payer: Self-pay | Admitting: Emergency Medicine

## 2013-06-06 NOTE — Telephone Encounter (Signed)
Message copied by Ricci Barker on Thu Jun 06, 2013  3:01 PM ------      Message from: Doran Heater      Created: Wed Jun 05, 2013  2:42 PM       Please let pt know xr wnl thanks AW ------

## 2013-06-06 NOTE — Telephone Encounter (Signed)
Left message via pacific interpretor for Nepali language

## 2013-07-04 ENCOUNTER — Ambulatory Visit: Payer: No Typology Code available for payment source | Attending: Internal Medicine | Admitting: Internal Medicine

## 2013-07-04 ENCOUNTER — Encounter: Payer: Self-pay | Admitting: Internal Medicine

## 2013-07-04 VITALS — BP 118/83 | HR 84 | Temp 98.1°F | Resp 18 | Ht 65.0 in | Wt 169.0 lb

## 2013-07-04 DIAGNOSIS — M25519 Pain in unspecified shoulder: Secondary | ICD-10-CM | POA: Insufficient documentation

## 2013-07-04 DIAGNOSIS — Z79899 Other long term (current) drug therapy: Secondary | ICD-10-CM | POA: Insufficient documentation

## 2013-07-04 DIAGNOSIS — Z791 Long term (current) use of non-steroidal anti-inflammatories (NSAID): Secondary | ICD-10-CM | POA: Insufficient documentation

## 2013-07-04 DIAGNOSIS — M25511 Pain in right shoulder: Secondary | ICD-10-CM

## 2013-07-04 DIAGNOSIS — E785 Hyperlipidemia, unspecified: Secondary | ICD-10-CM | POA: Insufficient documentation

## 2013-07-04 MED ORDER — DICLOFENAC SODIUM 75 MG PO TBEC: 75.0000 mg | DELAYED_RELEASE_TABLET | Freq: Two times a day (BID) | ORAL | Status: DC

## 2013-07-04 MED ORDER — CYCLOBENZAPRINE HCL 10 MG PO TABS: 10.0000 mg | ORAL_TABLET | Freq: Every day | ORAL | Status: DC

## 2013-07-04 NOTE — Patient Instructions (Signed)
Fat and Cholesterol Control Diet  Fat and cholesterol levels in your blood and organs are influenced by your diet. High levels of fat and cholesterol may lead to diseases of the heart, small and large blood vessels, gallbladder, liver, and pancreas.  CONTROLLING FAT AND CHOLESTEROL WITH DIET  Although exercise and lifestyle factors are important, your diet is key. That is because certain foods are known to raise cholesterol and others to lower it. The goal is to balance foods for their effect on cholesterol and more importantly, to replace saturated and trans fat with other types of fat, such as monounsaturated fat, polyunsaturated fat, and omega-3 fatty acids.  On average, a person should consume no more than 15 to 17 g of saturated fat daily. Saturated and trans fats are considered "bad" fats, and they will raise LDL cholesterol. Saturated fats are primarily found in animal products such as meats, butter, and cream. However, that does not mean you need to give up all your favorite foods. Today, there are good tasting, low-fat, low-cholesterol substitutes for most of the things you like to eat. Choose low-fat or nonfat alternatives. Choose round or loin cuts of red meat. These types of cuts are lowest in fat and cholesterol. Chicken (without the skin), fish, veal, and ground turkey breast are great choices. Eliminate fatty meats, such as hot dogs and salami. Even shellfish have little or no saturated fat. Have a 3 oz (85 g) portion when you eat lean meat, poultry, or fish.  Trans fats are also called "partially hydrogenated oils." They are oils that have been scientifically manipulated so that they are solid at room temperature resulting in a longer shelf life and improved taste and texture of foods in which they are added. Trans fats are found in stick margarine, some tub margarines, cookies, crackers, and baked goods.   When baking and cooking, oils are a great substitute for butter. The monounsaturated oils are  especially beneficial since it is believed they lower LDL and raise HDL. The oils you should avoid entirely are saturated tropical oils, such as coconut and palm.   Remember to eat a lot from food groups that are naturally free of saturated and trans fat, including fish, fruit, vegetables, beans, grains (barley, rice, couscous, bulgur wheat), and pasta (without cream sauces).   IDENTIFYING FOODS THAT LOWER FAT AND CHOLESTEROL   Soluble fiber may lower your cholesterol. This type of fiber is found in fruits such as apples, vegetables such as broccoli, potatoes, and carrots, legumes such as beans, peas, and lentils, and grains such as barley. Foods fortified with plant sterols (phytosterol) may also lower cholesterol. You should eat at least 2 g per day of these foods for a cholesterol lowering effect.   Read package labels to identify low-saturated fats, trans fat free, and low-fat foods at the supermarket. Select cheeses that have only 2 to 3 g saturated fat per ounce. Use a heart-healthy tub margarine that is free of trans fats or partially hydrogenated oil. When buying baked goods (cookies, crackers), avoid partially hydrogenated oils. Breads and muffins should be made from whole grains (whole-wheat or whole oat flour, instead of "flour" or "enriched flour"). Buy non-creamy canned soups with reduced salt and no added fats.   FOOD PREPARATION TECHNIQUES   Never deep-fry. If you must fry, either stir-fry, which uses very little fat, or use non-stick cooking sprays. When possible, broil, bake, or roast meats, and steam vegetables. Instead of putting butter or margarine on vegetables, use lemon   and herbs, applesauce, and cinnamon (for squash and sweet potatoes). Use nonfat yogurt, salsa, and low-fat dressings for salads.   LOW-SATURATED FAT / LOW-FAT FOOD SUBSTITUTES  Meats / Saturated Fat (g)  · Avoid: Steak, marbled (3 oz/85 g) / 11 g  · Choose: Steak, lean (3 oz/85 g) / 4 g  · Avoid: Hamburger (3 oz/85 g) / 7  g  · Choose: Hamburger, lean (3 oz/85 g) / 5 g  · Avoid: Ham (3 oz/85 g) / 6 g  · Choose: Ham, lean cut (3 oz/85 g) / 2.4 g  · Avoid: Chicken, with skin, dark meat (3 oz/85 g) / 4 g  · Choose: Chicken, skin removed, dark meat (3 oz/85 g) / 2 g  · Avoid: Chicken, with skin, light meat (3 oz/85 g) / 2.5 g  · Choose: Chicken, skin removed, light meat (3 oz/85 g) / 1 g  Dairy / Saturated Fat (g)  · Avoid: Whole milk (1 cup) / 5 g  · Choose: Low-fat milk, 2% (1 cup) / 3 g  · Choose: Low-fat milk, 1% (1 cup) / 1.5 g  · Choose: Skim milk (1 cup) / 0.3 g  · Avoid: Hard cheese (1 oz/28 g) / 6 g  · Choose: Skim milk cheese (1 oz/28 g) / 2 to 3 g  · Avoid: Cottage cheese, 4% fat (1 cup) / 6.5 g  · Choose: Low-fat cottage cheese, 1% fat (1 cup) / 1.5 g  · Avoid: Ice cream (1 cup) / 9 g  · Choose: Sherbet (1 cup) / 2.5 g  · Choose: Nonfat frozen yogurt (1 cup) / 0.3 g  · Choose: Frozen fruit bar / trace  · Avoid: Whipped cream (1 tbs) / 3.5 g  · Choose: Nondairy whipped topping (1 tbs) / 1 g  Condiments / Saturated Fat (g)  · Avoid: Mayonnaise (1 tbs) / 2 g  · Choose: Low-fat mayonnaise (1 tbs) / 1 g  · Avoid: Butter (1 tbs) / 7 g  · Choose: Extra light margarine (1 tbs) / 1 g  · Avoid: Coconut oil (1 tbs) / 11.8 g  · Choose: Olive oil (1 tbs) / 1.8 g  · Choose: Corn oil (1 tbs) / 1.7 g  · Choose: Safflower oil (1 tbs) / 1.2 g  · Choose: Sunflower oil (1 tbs) / 1.4 g  · Choose: Soybean oil (1 tbs) / 2.4 g  · Choose: Canola oil (1 tbs) / 1 g  Document Released: 03/07/2005 Document Revised: 07/02/2012 Document Reviewed: 08/26/2010  [employer]® Patient Information ©2014 [employer].

## 2013-07-04 NOTE — Progress Notes (Signed)
MRN: [id number] Name: David Barber  Sex: male Age: 64 y.o. DOB: 06-21-1950  Allergies: Review of patient's allergies indicates no known allergies.  Chief Complaint  Patient presents with  . Follow-up  . Shoulder Pain    HPI: Patient is 63 y.o. male who comes today for followup her, last month he was seen by Dr. Sherral Hammers did complain of right shoulder pain denies any fall or trauma, the pain is 6/10 more worse at night especially when he sleeps on the right side of her, currently the pain is 6/10 denies any numbness weakness denies any chest and shortness of breath, patient was prescribed meloxicam as per patient he did not help him with the symptoms., previous blood work reviewed noticed hyperlipidemia.  Past Medical History  Diagnosis Date  . Bronchitis     History reviewed. No pertinent past surgical history.    Medication List       This list is accurate as of: 07/04/13 10:40 AM.  Always use your most recent med list.               albuterol 108 (90 BASE) MCG/ACT inhaler  Commonly known as:  PROVENTIL HFA;VENTOLIN HFA  Inhale 2 puffs into the lungs every 6 (six) hours as needed for wheezing.     cyclobenzaprine 10 MG tablet  Commonly known as:  FLEXERIL  Take 1 tablet (10 mg total) by mouth at bedtime.     diclofenac 75 MG EC tablet  Commonly known as:  VOLTAREN  Take 1 tablet (75 mg total) by mouth 2 (two) times daily.     ibuprofen 600 MG tablet  Commonly known as:  ADVIL,MOTRIN  Take 1 tablet (600 mg total) by mouth every 8 (eight) hours as needed for pain.        Meds ordered this encounter  Medications  . diclofenac (VOLTAREN) 75 MG EC tablet    Sig: Take 1 tablet (75 mg total) by mouth 2 (two) times daily.    Dispense:  60 tablet    Refill:  3  . cyclobenzaprine (FLEXERIL) 10 MG tablet    Sig: Take 1 tablet (10 mg total) by mouth at bedtime.    Dispense:  30 tablet    Refill:  3     There is no immunization history on file for this  patient.  History reviewed. No pertinent family history.  History  Substance Use Topics  . Smoking status: Current Every Day Smoker  . Smokeless tobacco: Not on file  . Alcohol Use: Not on file    Review of Systems   As noted in HPI  Filed Vitals:   07/04/13 1010  BP: 118/83  Pulse: 84  Temp: 98.1 F (36.7 C)  Resp: 18    Physical Exam  Physical Exam  Constitutional: No distress.  Eyes: EOM are normal. Pupils are equal, round, and reactive to light.  Cardiovascular: Normal rate and regular rhythm.   Pulmonary/Chest: Breath sounds normal. No respiratory distress. He has no wheezes. He has no rales.  Musculoskeletal:  Right shoulder minimal tenderness anteriorly , Good ROM 2+ radial pulse, hand grip OK DTR 2+    CBC    Component Value Date/Time   WBC 8.6 11/23/2012 1355   RBC 5.56 11/23/2012 1355   HGB 15.7 11/23/2012 1355   HCT 46.5 11/23/2012 1355   PLT 234 11/23/2012 1355   MCV 83.6 11/23/2012 1355   LYMPHSABS 2.4 11/23/2012 1355   MONOABS 0.6 11/23/2012 1355  EOSABS 0.5 11/23/2012 1355   BASOSABS 0.1 11/23/2012 1355    CMP     Component Value Date/Time   NA 138 11/23/2012 1355   K 4.1 11/23/2012 1355   CL 103 11/23/2012 1355   CO2 27 11/23/2012 1355   GLUCOSE 107* 11/23/2012 1355   BUN 11 11/23/2012 1355   CREATININE 0.70 11/23/2012 1355   CALCIUM 9.3 11/23/2012 1355   PROT 7.3 11/23/2012 1355   ALBUMIN 3.9 11/23/2012 1355   AST 24 11/23/2012 1355   ALT 22 11/23/2012 1355   ALKPHOS 96 11/23/2012 1355   BILITOT 0.5 11/23/2012 1355    Lab Results  Component Value Date/Time   CHOL 207* 11/23/2012  1:55 PM    No components found with this basename: hga1c    Lab Results  Component Value Date/Time   AST 24 11/23/2012  1:55 PM    Assessment and Plan  Right shoulder pain  Shoulder x-rays negative, advise patient to apply warm compress prescribed diclofenac as well as Flexeril each bedtime  Other and unspecified hyperlipidemia Patient for low fat diet.  Return for shoulder pain,  hyperipidemia.  Lorayne Marek, MD

## 2013-07-04 NOTE — Progress Notes (Signed)
Pt here to f/u right shoulder pain since last visit 1 mnth ago States he didn't receive xray results, which are completely negative Meloxicam prescribed causing constipation Spanish interpretor present

## 2013-10-07 ENCOUNTER — Ambulatory Visit: Payer: No Typology Code available for payment source | Attending: Internal Medicine

## 2013-12-04 ENCOUNTER — Ambulatory Visit: Payer: Self-pay

## 2014-06-16 ENCOUNTER — Other Ambulatory Visit: Payer: Self-pay

## 2014-06-16 ENCOUNTER — Ambulatory Visit (HOSPITAL_BASED_OUTPATIENT_CLINIC_OR_DEPARTMENT_OTHER): Payer: Medicaid Other | Admitting: Internal Medicine

## 2014-06-16 ENCOUNTER — Encounter: Payer: Self-pay | Admitting: Internal Medicine

## 2014-06-16 ENCOUNTER — Ambulatory Visit
Admission: RE | Admit: 2014-06-16 | Discharge: 2014-06-16 | Disposition: A | Discharge status: Home / Self Care | Payer: Medicaid Other | Source: Ambulatory Visit | Attending: Cardiology | Admitting: Cardiology

## 2014-06-16 VITALS — BP 138/91 | HR 111 | Temp 98.0°F | Resp 15 | Wt 174.4 lb

## 2014-06-16 DIAGNOSIS — R079 Chest pain, unspecified: Secondary | ICD-10-CM | POA: Diagnosis present

## 2014-06-16 DIAGNOSIS — M79641 Pain in right hand: Secondary | ICD-10-CM | POA: Diagnosis not present

## 2014-06-16 DIAGNOSIS — R000 Tachycardia, unspecified: Secondary | ICD-10-CM | POA: Insufficient documentation

## 2014-06-16 DIAGNOSIS — J449 Chronic obstructive pulmonary disease, unspecified: Secondary | ICD-10-CM | POA: Diagnosis not present

## 2014-06-16 DIAGNOSIS — Z139 Encounter for screening, unspecified: Secondary | ICD-10-CM

## 2014-06-16 DIAGNOSIS — Z8639 Personal history of other endocrine, nutritional and metabolic disease: Secondary | ICD-10-CM

## 2014-06-16 DIAGNOSIS — J45909 Unspecified asthma, uncomplicated: Secondary | ICD-10-CM | POA: Diagnosis not present

## 2014-06-16 DIAGNOSIS — Z8709 Personal history of other diseases of the respiratory system: Secondary | ICD-10-CM | POA: Diagnosis not present

## 2014-06-16 DIAGNOSIS — R9431 Abnormal electrocardiogram [ECG] [EKG]: Secondary | ICD-10-CM

## 2014-06-16 DIAGNOSIS — F17200 Nicotine dependence, unspecified, uncomplicated: Secondary | ICD-10-CM | POA: Insufficient documentation

## 2014-06-16 DIAGNOSIS — R0609 Other forms of dyspnea: Secondary | ICD-10-CM

## 2014-06-16 LAB — CBC WITH DIFFERENTIAL/PLATELET
BASOS ABS: 0.1 10*3/uL (ref 0.0–0.1)
Basophils Relative: 1 % (ref 0–1)
Eosinophils Absolute: 0.6 10*3/uL (ref 0.0–0.7)
Eosinophils Relative: 7 % — ABNORMAL HIGH (ref 0–5)
HCT: 47.2 % (ref 39.0–52.0)
Hemoglobin: 15.7 g/dL (ref 13.0–17.0)
LYMPHS PCT: 27 % (ref 12–46)
Lymphs Abs: 2.3 10*3/uL (ref 0.7–4.0)
MCH: 28.1 pg (ref 26.0–34.0)
MCHC: 33.3 g/dL (ref 30.0–36.0)
MCV: 84.6 fL (ref 78.0–100.0)
MPV: 9.9 fL (ref 8.6–12.4)
Monocytes Absolute: 0.6 10*3/uL (ref 0.1–1.0)
Monocytes Relative: 7 % (ref 3–12)
NEUTROS ABS: 5 10*3/uL (ref 1.7–7.7)
Neutrophils Relative %: 58 % (ref 43–77)
PLATELETS: 266 10*3/uL (ref 150–400)
RBC: 5.58 MIL/uL (ref 4.22–5.81)
RDW: 14.1 % (ref 11.5–15.5)
WBC: 8.6 10*3/uL (ref 4.0–10.5)

## 2014-06-16 LAB — HEMOGLOBIN A1C
Hgb A1c MFr Bld: 6.3 % — ABNORMAL HIGH (ref <5.7)
MEAN PLASMA GLUCOSE: 134 mg/dL — AB (ref <117)

## 2014-06-16 MED ORDER — ALBUTEROL SULFATE HFA 108 (90 BASE) MCG/ACT IN AERS: 2.0000 | INHALATION_SPRAY | Freq: Four times a day (QID) | RESPIRATORY_TRACT | Status: DC | PRN

## 2014-06-16 NOTE — Progress Notes (Signed)
MRN: [id number] Name: David Barber  Sex: male Age: 64 y.o. DOB: 08/26/1950  Allergies: Review of patient's allergies indicates no known allergies.  Chief Complaint  Patient presents with  . Hand Pain    HPI: Patient is 64 y.o. male who history of asthma/COPD, hyperlipidemia comes today reported to have on and off chest pain which she describes as burning at lower part of sternum which is nonexertional, nonradiating, not associated with headache or dizziness or palpitation, currently the pain is 1/10 patient has not taken any medication to help him with the symptoms, he does report exertional shortness of breath though he has history of COPD and has ran out of his albuterol inhaler currently denies any shortness of breath, today he is tachycardic, he does report the  history of thyroid disease, today's EKG shows sinus tachycardia and some nonspecific ST-T changes in leads 2 and aVF, no previous EKG to compare. He also reported some right-sided hand pain which is now improved. Patient denies any family history of heart disease, denies any orthopnea PND or leg swelling.   Past Medical History  Diagnosis Date  . Bronchitis     History reviewed. No pertinent past surgical history.    Medication List       This list is accurate as of: 06/16/14  1:09 PM.  Always use your most recent med list.               albuterol 108 (90 BASE) MCG/ACT inhaler  Commonly known as:  PROVENTIL HFA;VENTOLIN HFA  Inhale 2 puffs into the lungs every 6 (six) hours as needed for wheezing or shortness of breath.     cyclobenzaprine 10 MG tablet  Commonly known as:  FLEXERIL  Take 1 tablet (10 mg total) by mouth at bedtime.     diclofenac 75 MG EC tablet  Commonly known as:  VOLTAREN  Take 1 tablet (75 mg total) by mouth 2 (two) times daily.     ibuprofen 600 MG tablet  Commonly known as:  ADVIL,MOTRIN  Take 1 tablet (600 mg total) by mouth every 8 (eight) hours as needed for pain.         Meds ordered this encounter  Medications  . albuterol (PROVENTIL HFA;VENTOLIN HFA) 108 (90 BASE) MCG/ACT inhaler    Sig: Inhale 2 puffs into the lungs every 6 (six) hours as needed for wheezing or shortness of breath.    Dispense:  1 Inhaler    Refill:  2     There is no immunization history on file for this patient.  History reviewed. No pertinent family history.  History  Substance Use Topics  . Smoking status: Current Every Day Smoker  . Smokeless tobacco: Not on file     Comment: chewing tobacco  . Alcohol Use: No    Review of Systems   As noted in HPI  Filed Vitals:   06/16/14 1130  BP: 138/91  Pulse: 111  Temp: 98 F (36.7 C)  Resp: 15    Physical Exam  Physical Exam  Constitutional: No distress.  Eyes: EOM are normal. Pupils are equal, round, and reactive to light.  Cardiovascular: Regular rhythm.  Exam reveals no friction rub.   No murmur heard. tachycardic  Pulmonary/Chest: Breath sounds normal. No respiratory distress. He has no wheezes. He has no rales.  Abdominal: Soft. There is no tenderness. There is no rebound.  Musculoskeletal: He exhibits no edema.    CBC    Component Value Date/Time  WBC 8.6 11/23/2012 1355   RBC 5.56 11/23/2012 1355   HGB 15.7 11/23/2012 1355   HCT 46.5 11/23/2012 1355   PLT 234 11/23/2012 1355   MCV 83.6 11/23/2012 1355   LYMPHSABS 2.4 11/23/2012 1355   MONOABS 0.6 11/23/2012 1355   EOSABS 0.5 11/23/2012 1355   BASOSABS 0.1 11/23/2012 1355    CMP     Component Value Date/Time   NA 138 11/23/2012 1355   K 4.1 11/23/2012 1355   CL 103 11/23/2012 1355   CO2 27 11/23/2012 1355   GLUCOSE 107* 11/23/2012 1355   BUN 11 11/23/2012 1355   CREATININE 0.70 11/23/2012 1355   CALCIUM 9.3 11/23/2012 1355   PROT 7.3 11/23/2012 1355   ALBUMIN 3.9 11/23/2012 1355   AST 24 11/23/2012 1355   ALT 22 11/23/2012 1355   ALKPHOS 96 11/23/2012 1355   BILITOT 0.5 11/23/2012 1355    Lab Results  Component Value  Date/Time   CHOL 207* 11/23/2012 01:55 PM    No components found for: HGA1C  Lab Results  Component Value Date/Time   AST 24 11/23/2012 01:55 PM    Assessment and Plan  Chest pain, unspecified chest pain type - Plan: on and off chest pain for the last 4 months,EKG 12-Lead shows nonspecific ST-T changes in lead 2 and aVF,no previous EKG to compare, I have ordered Troponin I  History of COPD - Plan: albuterol (PROVENTIL HFA;VENTOLIN HFA) 108 (90 BASE) MCG/ACT inhaler  Tachycardia - Plan: CBC with Differential/Platelet, TSH  History of thyroid disease - Plan: TSH, T4, free, Thyroid stimulating immunoglobulin, T3, free   Screening - Plan:ordered baseline blood work. CBC with Differential/Platelet, COMPLETE METABOLIC PANEL WITH GFR, Vit D  25 hydroxy (rtn osteoporosis monitoring), Hemoglobin A1c  Nonspecific abnormal electrocardiogram (ECG) (EKG)/DOE (dyspnea on exertion) Patient will be scheduled to see the cardiologist.  The patient was given clear instructions to go to ER or return to medical center if symptoms don't improve, worsen or new problems develop. The patient verbalized understanding.    Return in about 6 weeks (around 07/28/2014), or also schedule apt with cardiology.   This note has been created with Surveyor, quantity. Any transcriptional errors are unintentional.    Lorayne Marek, MD

## 2014-06-16 NOTE — Progress Notes (Signed)
Patient here with interpreter Complains of chest pain for the past three to four months Having some SOB Complains of pain to his right hand

## 2014-06-17 LAB — COMPLETE METABOLIC PANEL WITH GFR
ALK PHOS: 126 U/L — AB (ref 39–117)
ALT: 32 U/L (ref 0–53)
AST: 37 U/L (ref 0–37)
Albumin: 4.2 g/dL (ref 3.5–5.2)
BILIRUBIN TOTAL: 0.4 mg/dL (ref 0.2–1.2)
BUN: 10 mg/dL (ref 6–23)
CO2: 22 mEq/L (ref 19–32)
Calcium: 9.5 mg/dL (ref 8.4–10.5)
Chloride: 101 mEq/L (ref 96–112)
Creat: 0.84 mg/dL (ref 0.50–1.35)
GFR, Est African American: >89 mL/min
GLUCOSE: 102 mg/dL — AB (ref 70–99)
Potassium: 4.4 mEq/L (ref 3.5–5.3)
Sodium: 142 mEq/L (ref 135–145)
TOTAL PROTEIN: 7.8 g/dL (ref 6.0–8.3)

## 2014-06-17 LAB — VITAMIN D 25 HYDROXY (VIT D DEFICIENCY, FRACTURES): VIT D 25 HYDROXY: 13 ng/mL — AB (ref 30–100)

## 2014-06-17 LAB — TSH: TSH: 0.579 u[IU]/mL (ref 0.350–4.500)

## 2014-06-17 LAB — TROPONIN I: Troponin I: <0.01 ng/mL (ref <0.06)

## 2014-06-17 LAB — T3, FREE: T3, Free: 3.2 pg/mL (ref 2.3–4.2)

## 2014-06-17 LAB — T4, FREE: FREE T4: 1.05 ng/dL (ref 0.80–1.80)

## 2014-06-18 ENCOUNTER — Encounter: Payer: Self-pay | Admitting: Cardiology

## 2014-06-18 ENCOUNTER — Ambulatory Visit: Payer: Medicaid Other | Attending: Cardiology | Admitting: Cardiology

## 2014-06-18 VITALS — BP 134/95 | HR 93 | Temp 98.3°F | Resp 18 | Ht 65.0 in | Wt 173.0 lb

## 2014-06-18 DIAGNOSIS — R079 Chest pain, unspecified: Secondary | ICD-10-CM | POA: Insufficient documentation

## 2014-06-18 DIAGNOSIS — F1099 Alcohol use, unspecified with unspecified alcohol-induced disorder: Secondary | ICD-10-CM | POA: Diagnosis not present

## 2014-06-18 DIAGNOSIS — K210 Gastro-esophageal reflux disease with esophagitis: Secondary | ICD-10-CM | POA: Diagnosis not present

## 2014-06-18 DIAGNOSIS — K219 Gastro-esophageal reflux disease without esophagitis: Secondary | ICD-10-CM

## 2014-06-18 DIAGNOSIS — Z720 Tobacco use: Secondary | ICD-10-CM | POA: Diagnosis not present

## 2014-06-18 MED ORDER — PANTOPRAZOLE SODIUM 40 MG PO TBEC: 40.0000 mg | DELAYED_RELEASE_TABLET | Freq: Every day | ORAL | Status: DC

## 2014-06-18 NOTE — Progress Notes (Signed)
Patient complaining of chest pain x 5-7 months. Pain is sternal, burning in nature.  Pain 3/10. Patient also complains of shortness of breath with ambulation Interpretor present.

## 2014-06-18 NOTE — Assessment & Plan Note (Signed)
This is clearly esophagitis and reflux. Exacerbated by chewing tobacco and alcohol. I've prescribed PPI. Made it clear would not help for about a week to 10 days. He will continue on a daily thereafter as well. Encouraged to stop chewing tobacco and overuse of alcohol. No cardiac workup necessary.

## 2014-06-18 NOTE — Progress Notes (Signed)
Mr Fini is a 64 year old [place] male referred today for chest pain by Dr.Advani.   His present much constant in the center of his chest. It is described as a burning and 8. Is worse after he eats. He does consume alcohol and he chews tobacco.  He denies any problems swallowing. Denies any melena or blood in his stool.  History obtained by interpreter.  Exam no acute distress. Well-developed well-nourished. Dentition satisfactory. Neck shows no JVD carotid strokes are equal bilaterally without bruits. There is no thyroid enlargement. Lungs are clear to auscultation. Heart exam reveals a nondisplaced PMI normal S1-S2 no gallop. Abdominal exam is soft good bowel sounds. Extremities reveal no edema. Pulses are present.  EKG reviewed and shows normal sinus rhythm with minimal nonspecific ST segment changes. Troponin was negative in the clinic last visit.

## 2014-06-19 LAB — THYROID STIMULATING IMMUNOGLOBULIN: TSI: 32 %{baseline} (ref <140)

## 2014-09-08 ENCOUNTER — Encounter: Payer: Self-pay | Admitting: Internal Medicine

## 2014-09-08 ENCOUNTER — Ambulatory Visit: Payer: Medicaid Other | Attending: Internal Medicine | Admitting: Internal Medicine

## 2014-09-08 VITALS — BP 135/87 | HR 99 | Temp 97.9°F | Resp 16 | Wt 173.0 lb

## 2014-09-08 DIAGNOSIS — Z833 Family history of diabetes mellitus: Secondary | ICD-10-CM | POA: Insufficient documentation

## 2014-09-08 DIAGNOSIS — E559 Vitamin D deficiency, unspecified: Secondary | ICD-10-CM | POA: Insufficient documentation

## 2014-09-08 DIAGNOSIS — G8929 Other chronic pain: Secondary | ICD-10-CM | POA: Diagnosis not present

## 2014-09-08 DIAGNOSIS — Z791 Long term (current) use of non-steroidal anti-inflammatories (NSAID): Secondary | ICD-10-CM | POA: Diagnosis not present

## 2014-09-08 DIAGNOSIS — R21 Rash and other nonspecific skin eruption: Secondary | ICD-10-CM | POA: Insufficient documentation

## 2014-09-08 DIAGNOSIS — R7303 Prediabetes: Secondary | ICD-10-CM

## 2014-09-08 DIAGNOSIS — M545 Low back pain, unspecified: Secondary | ICD-10-CM

## 2014-09-08 DIAGNOSIS — Z79899 Other long term (current) drug therapy: Secondary | ICD-10-CM | POA: Insufficient documentation

## 2014-09-08 DIAGNOSIS — F17200 Nicotine dependence, unspecified, uncomplicated: Secondary | ICD-10-CM | POA: Insufficient documentation

## 2014-09-08 DIAGNOSIS — R7309 Other abnormal glucose: Secondary | ICD-10-CM | POA: Insufficient documentation

## 2014-09-08 MED ORDER — METHYLPREDNISOLONE 4 MG PO TBPK: ORAL_TABLET | ORAL | Status: DC

## 2014-09-08 MED ORDER — NAPROXEN 500 MG PO TABS: 500.0000 mg | ORAL_TABLET | Freq: Two times a day (BID) | ORAL | Status: DC

## 2014-09-08 MED ORDER — VITAMIN D (ERGOCALCIFEROL) 1.25 MG (50000 UNIT) PO CAPS: 50000.0000 [IU] | ORAL_CAPSULE | ORAL | Status: DC

## 2014-09-08 NOTE — Progress Notes (Signed)
MRN: [id number] Name: David Barber  Sex: male Age: 64 y.o. DOB: August 24, 1950  Allergies: Review of patient's allergies indicates no known allergies.  Chief Complaint  Patient presents with  . Back Pain    HPI: Patient is 64 y.o. male who has history of lower back pain, comes today for followup complaining of the pain which is 3/10 nonradiating, has been bothering her for the last 2 weeks, denies any incontinence denies any change in bowel habits, patient has not tried any over-the-counter medication, is also complaining of a rash on his lower abdomen which is itchy, he denies any change in soap, detergent, he has not tried any new food or medications, denies any fever chills chest pain shortness of breath, previous blood work reviewed with the patient noticed hemoglobin A1c of 6.3%, patient has prediabetes,patient does report family history of diabetes as well, also noticed vitamin D deficiency.  Past Medical History  Diagnosis Date  . Bronchitis     History reviewed. No pertinent past surgical history.    Medication List       This list is accurate as of: 09/08/14 12:05 PM.  Always use your most recent med list.               albuterol 108 (90 BASE) MCG/ACT inhaler  Commonly known as:  PROVENTIL HFA;VENTOLIN HFA  Inhale 2 puffs into the lungs every 6 (six) hours as needed for wheezing or shortness of breath.     methylPREDNISolone 4 MG Tbpk tablet  Commonly known as:  MEDROL DOSEPAK  follow package directions     naproxen 500 MG tablet  Commonly known as:  NAPROSYN  Take 1 tablet (500 mg total) by mouth 2 (two) times daily with a meal.     pantoprazole 40 MG tablet  Commonly known as:  PROTONIX  Take 1 tablet (40 mg total) by mouth daily.     Vitamin D (Ergocalciferol) 50000 UNITS Caps capsule  Commonly known as:  DRISDOL  Take 1 capsule (50,000 Units total) by mouth every 7 (seven) days.        Meds ordered this encounter  Medications  .  methylPREDNISolone (MEDROL DOSEPAK) 4 MG TBPK tablet    Sig: follow package directions    Dispense:  21 tablet    Refill:  0  . Vitamin D, Ergocalciferol, (DRISDOL) 50000 UNITS CAPS capsule    Sig: Take 1 capsule (50,000 Units total) by mouth every 7 (seven) days.    Dispense:  12 capsule    Refill:  0  . naproxen (NAPROSYN) 500 MG tablet    Sig: Take 1 tablet (500 mg total) by mouth 2 (two) times daily with a meal.    Dispense:  30 tablet    Refill:  1     There is no immunization history on file for this patient.  History reviewed. No pertinent family history.  History  Substance Use Topics  . Smoking status: Current Every Day Smoker  . Smokeless tobacco: Not on file     Comment: chewing tobacco  . Alcohol Use: No    Review of Systems   As noted in HPI  Filed Vitals:   09/08/14 1143  BP: 135/87  Pulse: 99  Temp: 97.9 F (36.6 C)  Resp: 16    Physical Exam  Physical Exam  Constitutional: No distress.  Eyes: EOM are normal. Pupils are equal, round, and reactive to light.  Cardiovascular: Normal rate and regular rhythm.   Pulmonary/Chest:  Breath sounds normal. No respiratory distress. He has no wheezes. He has no rales.  Abdominal: Soft. There is no tenderness. There is no rebound.  Musculoskeletal:  Minimal lower lumbar spinal tenderness, SLR negative, equal strength bilateral lower extremities, DTR 2+  Skin:  Macular rash noted on left side of abdomen    CBC    Component Value Date/Time   WBC 8.6 06/16/2014 1214   RBC 5.58 06/16/2014 1214   HGB 15.7 06/16/2014 1214   HCT 47.2 06/16/2014 1214   PLT 266 06/16/2014 1214   MCV 84.6 06/16/2014 1214   LYMPHSABS 2.3 06/16/2014 1214   MONOABS 0.6 06/16/2014 1214   EOSABS 0.6 06/16/2014 1214   BASOSABS 0.1 06/16/2014 1214    CMP     Component Value Date/Time   NA 142 06/16/2014 1214   K 4.4 06/16/2014 1214   CL 101 06/16/2014 1214   CO2 22 06/16/2014 1214   GLUCOSE 102* 06/16/2014 1214   BUN 10  06/16/2014 1214   CREATININE 0.84 06/16/2014 1214   CALCIUM 9.5 06/16/2014 1214   PROT 7.8 06/16/2014 1214   ALBUMIN 4.2 06/16/2014 1214   AST 37 06/16/2014 1214   ALT 32 06/16/2014 1214   ALKPHOS 126* 06/16/2014 1214   BILITOT 0.4 06/16/2014 1214   GFRNONAA >89 06/16/2014 1214   GFRAA >89 06/16/2014 1214    Lab Results  Component Value Date/Time   CHOL 207* 11/23/2012 01:55 PM    Lab Results  Component Value Date/Time   HGBA1C 6.3* 06/16/2014 12:14 PM   HGBA1C 5.8 11/23/2012 02:18 PM    Lab Results  Component Value Date/Time   AST 37 06/16/2014 12:14 PM    Assessment and Plan  Chronic lower back pain - Plan: methylPREDNISolone (MEDROL DOSEPAK) 4 MG TBPK tablet, naproxen (NAPROSYN) 500 MG tablet, as needed  Prediabetes I have advised patient for diabetes meal planning, repeat A1c in 3 months  Vitamin D deficiency - Plan: started patient on Vitamin D, Ergocalciferol, (DRISDOL) 50000 UNITS CAPS capsule  Rash and nonspecific skin eruption - Plan: methylPREDNISolone (MEDROL DOSEPAK) 4 MG TBPK tablet   Return in about 3 months (around 12/09/2014), or if symptoms worsen or fail to improve, for prediabetes.   This note has been created with Surveyor, quantity. Any transcriptional errors are unintentional.    Lorayne Marek, MD

## 2014-09-08 NOTE — Progress Notes (Signed)
Patient complains of back pain that started two weeks ago Patient denies any injury Patient also complains of a rash on his stomach that also started about two  Weeks ago

## 2014-11-17 ENCOUNTER — Other Ambulatory Visit: Payer: Self-pay | Admitting: Family Medicine

## 2014-11-17 ENCOUNTER — Encounter: Payer: Self-pay | Admitting: Family Medicine

## 2014-11-17 ENCOUNTER — Ambulatory Visit: Payer: Medicaid Other | Attending: Family Medicine | Admitting: Family Medicine

## 2014-11-17 VITALS — BP 117/81 | HR 109 | Temp 97.7°F | Resp 16 | Ht 65.0 in | Wt 174.0 lb

## 2014-11-17 DIAGNOSIS — Z720 Tobacco use: Secondary | ICD-10-CM | POA: Diagnosis not present

## 2014-11-17 DIAGNOSIS — Z8709 Personal history of other diseases of the respiratory system: Secondary | ICD-10-CM | POA: Diagnosis not present

## 2014-11-17 DIAGNOSIS — K219 Gastro-esophageal reflux disease without esophagitis: Secondary | ICD-10-CM

## 2014-11-17 DIAGNOSIS — J449 Chronic obstructive pulmonary disease, unspecified: Secondary | ICD-10-CM | POA: Diagnosis not present

## 2014-11-17 MED ORDER — ALBUTEROL SULFATE HFA 108 (90 BASE) MCG/ACT IN AERS: 2.0000 | INHALATION_SPRAY | Freq: Four times a day (QID) | RESPIRATORY_TRACT | Status: DC | PRN

## 2014-11-17 MED ORDER — OMEPRAZOLE 20 MG PO CPDR: 20.0000 mg | DELAYED_RELEASE_CAPSULE | Freq: Every day | ORAL | Status: DC

## 2014-11-17 NOTE — Assessment & Plan Note (Signed)
COPD: Albuterol ordered before exercise  Good job with quitting smoking

## 2014-11-17 NOTE — Progress Notes (Signed)
Establish Care with PCP C/C forgetfulness, loss of hearing. Chest pain- burning pain x3 month ago  Pain scale #6 Tobacco user- Chew tobacco     Used Education administrator

## 2014-11-17 NOTE — Progress Notes (Signed)
   Subjective:    Patient ID: David Barber, male    DOB: 1950/05/22, 64 y.o.   MRN: [id number] CC: Chest pain  HPI 64 yo [place] male presents for f/u   1. Chest pain:  X 1 year. Exacerbated for ETOH. No food triggers. Burning, substernal, non-radiating CP. No stomach pain. No nausea, emesis or weight loss. Patient does have bitter taste in mouth in AM.   2. SOB: SOB with exercise. Current nicotine chewer. Former smoker x 50 years. Quit last year. intermittent cough.    Social History  Substance Use Topics  . Smoking status: Former Smoker    Types: Cigarettes    Quit date: 10/19/2013  . Smokeless tobacco: Current User     Comment: chewing tobacco  . Alcohol Use: No    Review of Systems  Constitutional: Negative for fever, chills, fatigue and unexpected weight change.  Eyes: Negative for visual disturbance.  Respiratory: Positive for cough and shortness of breath.   Cardiovascular: Positive for chest pain. Negative for palpitations and leg swelling.  Gastrointestinal: Negative for nausea, vomiting, abdominal pain, diarrhea, constipation and blood in stool.  Endocrine: Negative for polydipsia, polyphagia and polyuria.  Musculoskeletal: Negative for myalgias, back pain, arthralgias, gait problem and neck pain.  Skin: Negative for rash.  Allergic/Immunologic: Negative for immunocompromised state.  Hematological: Negative for adenopathy. Does not bruise/bleed easily.  Psychiatric/Behavioral: Negative for suicidal ideas, sleep disturbance and dysphoric mood. The patient is not nervous/anxious.        Objective:   Physical Exam  Constitutional: He appears well-developed and well-nourished. No distress.  HENT:  Mouth/Throat: Abnormal dentition. No oropharyngeal exudate.  Neck: Normal range of motion. Neck supple.  Cardiovascular: Normal rate, regular rhythm, normal heart sounds and intact distal pulses.   Pulmonary/Chest: Effort normal and breath sounds normal.  Abdominal:  Soft. There is no tenderness.  Musculoskeletal: He exhibits no edema.  Neurological: He is alert.  Skin: Skin is warm and dry. No rash noted. No erythema.  Psychiatric: He has a normal mood and affect.  BP 117/81 mmHg  Pulse 109  Temp(Src) 97.7 F (36.5 C)  Resp 16  Ht 5\' 5"  (1.651 m)  Wt 174 lb (78.926 kg)  BMI 28.96 kg/m2  SpO2 93% Wt Readings from Last 3 Encounters:  11/17/14 174 lb (78.926 kg)  09/08/14 173 lb (78.472 kg)  06/18/14 173 lb (78.472 kg)           Assessment & Plan:

## 2014-11-17 NOTE — Assessment & Plan Note (Signed)
Chewing tobacco Work on quitting Transition for chewing tobacco to nicotine gum

## 2014-11-17 NOTE — Assessment & Plan Note (Signed)
GERD: Taking omeprazole 20 mg nightly before supper

## 2014-11-17 NOTE — Patient Instructions (Addendum)
Mr. Bunte,  Thank you for coming in today. It was a pleasure meeting you. I look forward to being your primary doctor.   1. COPD: Albuterol ordered before exercise  Good job with quitting smoking  2. GERD: Taking omeprazole 20 mg nightly before supper  3. Chewing tobacco Work on quitting Transition for chewing tobacco to nicotine gum   F/u September for flu clinic F/u with me in 3 months for GERD

## 2015-02-11 ENCOUNTER — Encounter: Payer: Self-pay | Admitting: Family Medicine

## 2015-02-11 ENCOUNTER — Ambulatory Visit: Payer: Medicaid Other | Attending: Family Medicine | Admitting: Family Medicine

## 2015-02-11 VITALS — BP 129/76 | HR 95 | Temp 97.8°F | Resp 16 | Ht 65.0 in | Wt 175.0 lb

## 2015-02-11 DIAGNOSIS — Z87891 Personal history of nicotine dependence: Secondary | ICD-10-CM | POA: Insufficient documentation

## 2015-02-11 DIAGNOSIS — J449 Chronic obstructive pulmonary disease, unspecified: Secondary | ICD-10-CM | POA: Diagnosis not present

## 2015-02-11 DIAGNOSIS — R7303 Prediabetes: Secondary | ICD-10-CM | POA: Insufficient documentation

## 2015-02-11 DIAGNOSIS — M545 Low back pain: Secondary | ICD-10-CM

## 2015-02-11 DIAGNOSIS — E559 Vitamin D deficiency, unspecified: Secondary | ICD-10-CM

## 2015-02-11 DIAGNOSIS — Z79899 Other long term (current) drug therapy: Secondary | ICD-10-CM | POA: Diagnosis not present

## 2015-02-11 DIAGNOSIS — R42 Dizziness and giddiness: Secondary | ICD-10-CM | POA: Insufficient documentation

## 2015-02-11 DIAGNOSIS — H9209 Otalgia, unspecified ear: Secondary | ICD-10-CM | POA: Insufficient documentation

## 2015-02-11 DIAGNOSIS — H9193 Unspecified hearing loss, bilateral: Secondary | ICD-10-CM

## 2015-02-11 DIAGNOSIS — G8929 Other chronic pain: Secondary | ICD-10-CM | POA: Insufficient documentation

## 2015-02-11 DIAGNOSIS — Z0000 Encounter for general adult medical examination without abnormal findings: Secondary | ICD-10-CM

## 2015-02-11 DIAGNOSIS — Z720 Tobacco use: Secondary | ICD-10-CM

## 2015-02-11 LAB — CBC
HCT: 49.4 % (ref 39.0–52.0)
HEMOGLOBIN: 16.3 g/dL (ref 13.0–17.0)
MCH: 28.2 pg (ref 26.0–34.0)
MCHC: 33 g/dL (ref 30.0–36.0)
MCV: 85.6 fL (ref 78.0–100.0)
MPV: 9.8 fL (ref 8.6–12.4)
Platelets: 262 10*3/uL (ref 150–400)
RBC: 5.77 MIL/uL (ref 4.22–5.81)
RDW: 14.6 % (ref 11.5–15.5)
WBC: 8.8 10*3/uL (ref 4.0–10.5)

## 2015-02-11 LAB — POCT GLYCOSYLATED HEMOGLOBIN (HGB A1C): Hemoglobin A1C: 6.4

## 2015-02-11 MED ORDER — ALBUTEROL SULFATE HFA 108 (90 BASE) MCG/ACT IN AERS: 2.0000 | INHALATION_SPRAY | Freq: Four times a day (QID) | RESPIRATORY_TRACT | Status: DC | PRN

## 2015-02-11 MED ORDER — TIOTROPIUM BROMIDE MONOHYDRATE 18 MCG IN CAPS: 18.0000 ug | ORAL_CAPSULE | Freq: Every day | RESPIRATORY_TRACT | Status: DC

## 2015-02-11 NOTE — Patient Instructions (Addendum)
David Barber was seen today for ear pain.  Diagnoses and all orders for this visit:  COPD, mild ([place]) -     albuterol (PROVENTIL HFA;VENTOLIN HFA) 108 (90 BASE) MCG/ACT inhaler; Inhale 2 puffs into the lungs every 6 (six) hours as needed for wheezing or shortness of breath. -     tiotropium (SPIRIVA HANDIHALER) 18 MCG inhalation capsule; Place 1 capsule (18 mcg total) into inhaler and inhale daily.  Decreased hearing of both ears -     Ambulatory referral to Audiology  Dizzy -     CBC -     Vitamin D, 25-hydroxy -     HgB A1c  Chewing tobacco use

## 2015-02-11 NOTE — Assessment & Plan Note (Signed)
Referral to audiology.

## 2015-02-11 NOTE — Assessment & Plan Note (Signed)
Cessation addressed with patient He plans to quit

## 2015-02-11 NOTE — Progress Notes (Signed)
Dizziness HA Ear pain x 1 year  Referral for ENT Pain scale # 3 Tobacco user -chew No suicide thought in the past two week    Used Video  Interpreter [place] [phone]

## 2015-02-11 NOTE — Progress Notes (Signed)
Patient ID: Jonathin Boulton, male   DOB: 1950-05-13, 64 y.o.   MRN: [id number]   Subjective:  Patient ID: [name], male    DOB: 07-16-1950  Age: 64 y.o. MRN: [id number] [place] interpreter used  CC: Ear Pain   HPI Refugio Manns [place] presents for   1. Ear pain: x one year. With headache.   Social History  Substance Use Topics  . Smoking status: Former Smoker    Types: Cigarettes    Quit date: 10/19/2013  . Smokeless tobacco: Current User     Comment: chewing tobacco  . Alcohol Use: No    Outpatient Prescriptions Prior to Visit  Medication Sig Dispense Refill  . albuterol (PROVENTIL HFA;VENTOLIN HFA) 108 (90 BASE) MCG/ACT inhaler Inhale 2 puffs into the lungs every 6 (six) hours as needed for wheezing or shortness of breath. (Patient not taking: Reported on 02/11/2015) 1 Inhaler 2  . omeprazole (PRILOSEC) 20 MG capsule Take 1 capsule (20 mg total) by mouth daily before supper. (Patient not taking: Reported on 02/11/2015) 30 capsule 2   No facility-administered medications prior to visit.    ROS Review of Systems  Constitutional: Negative for fever, chills, fatigue and unexpected weight change.  HENT: Positive for ear pain.   Eyes: Negative for visual disturbance.  Respiratory: Positive for shortness of breath. Negative for cough.   Cardiovascular: Negative for chest pain, palpitations and leg swelling.  Gastrointestinal: Negative for nausea, vomiting, abdominal pain, diarrhea, constipation and blood in stool.  Endocrine: Negative for polydipsia, polyphagia and polyuria.  Musculoskeletal: Negative for myalgias, back pain, arthralgias, gait problem and neck pain.  Skin: Negative for rash.  Allergic/Immunologic: Negative for immunocompromised state.  Neurological: Positive for dizziness and headaches.  Hematological: Negative for adenopathy. Does not bruise/bleed easily.  Psychiatric/Behavioral: Negative for suicidal ideas, sleep disturbance and dysphoric mood. The  patient is not nervous/anxious.     Objective:  BP 129/76 mmHg  Pulse 95  Temp(Src) 97.8 F (36.6 C) (Oral)  Resp 16  Ht 5\' 5"  (1.651 m)  Wt 175 lb (79.379 kg)  BMI 29.12 kg/m2  SpO2 96% Orthostatic VS for the past 24 hrs:  BP- Lying Pulse- Lying BP- Sitting Pulse- Sitting BP- Standing at 0 minutes Pulse- Standing at 0 minutes  02/11/15 1215 123/82 mmHg 81 123/85 mmHg 83 118/85 mmHg 93    BP/Weight 02/11/2015 11/17/2014 [id number]  Systolic BP Q000111Q [id number] [id number]  Diastolic BP 76 81 87  Wt. (Lbs) 175 174 173  BMI 29.12 28.96 28.79   Physical Exam  Constitutional: He appears well-developed and well-nourished. No distress.  HENT:  Head: Normocephalic and atraumatic.  Neck: Normal range of motion. Neck supple. Carotid bruit is not present.  Cardiovascular: Normal rate, regular rhythm, normal heart sounds and intact distal pulses.   Pulmonary/Chest: Effort normal. He has wheezes.  Musculoskeletal: He exhibits no edema.  Neurological: He is alert.  Skin: Skin is warm and dry. No rash noted. No erythema.  Psychiatric: He has a normal mood and affect.   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 Wheezing on exam. Endorses SOB at times  P: Albuterol for prn use spiriva for daily use       Relevant Medications   albuterol (PROVENTIL HFA;VENTOLIN HFA) 108 (  90 BASE) MCG/ACT inhaler   tiotropium (SPIRIVA HANDIHALER) 18 MCG inhalation capsule   Decreased hearing of both ears (Chronic)    Referral to audiology       Relevant Orders   Ambulatory referral to Audiology   Dizzy    A: dizzy with standing for 6 months. Exam reassuring. Orthostatic VS negative. P: CBC Vit D  A1c as patient is pre-diabetic       Relevant Orders   CBC   Vitamin D, 25-hydroxy   HgB A1c    Other Visit Diagnoses     Healthcare maintenance        Relevant Orders    Ambulatory referral to Gastroenterology       No orders of the defined types were placed in this encounter.    Follow-up: No Follow-up on file.   Boykin Nearing MD

## 2015-02-11 NOTE — Assessment & Plan Note (Signed)
A: dizzy with standing for 6 months. Exam reassuring. Orthostatic VS negative. P: CBC Vit D  A1c as patient is pre-diabetic

## 2015-02-11 NOTE — Assessment & Plan Note (Signed)
A: he has quit smoking. Wheezing on exam. Endorses SOB at times  P: Albuterol for prn use spiriva for daily use

## 2015-02-12 LAB — VITAMIN D 25 HYDROXY (VIT D DEFICIENCY, FRACTURES): VIT D 25 HYDROXY: 19 ng/mL — AB (ref 30–100)

## 2015-02-16 MED ORDER — VITAMIN D (ERGOCALCIFEROL) 1.25 MG (50000 UNIT) PO CAPS: 50000.0000 [IU] | ORAL_CAPSULE | ORAL | Status: DC

## 2015-02-16 NOTE — Addendum Note (Signed)
Addended by: Boykin Nearing on: 02/16/2015 09:23 AM   Modules accepted: Orders

## 2015-02-17 ENCOUNTER — Encounter: Payer: Self-pay | Admitting: Internal Medicine

## 2015-02-18 ENCOUNTER — Telehealth: Payer: Self-pay | Admitting: *Deleted

## 2015-02-18 NOTE — Telephone Encounter (Signed)
-----   Message from Boykin Nearing, MD sent at 02/16/2015  9:22 AM EST ----- Vit D deficiency, vit D ordered  CBC normal

## 2015-02-18 NOTE — Telephone Encounter (Signed)
Used [place] interpreter [place] # [phone] Date of birth verified by pt  Normal CBC results given Vit D deficiency  Pt verbalized understanding

## 2015-04-10 ENCOUNTER — Ambulatory Visit (AMBULATORY_SURGERY_CENTER): Payer: Self-pay | Admitting: *Deleted

## 2015-04-10 VITALS — Ht 65.0 in | Wt 177.4 lb

## 2015-04-10 DIAGNOSIS — Z1211 Encounter for screening for malignant neoplasm of colon: Secondary | ICD-10-CM

## 2015-04-10 MED ORDER — NA SULFATE-K SULFATE-MG SULF 17.5-3.13-1.6 GM/177ML PO SOLN: ORAL | Status: DC

## 2015-04-10 NOTE — Progress Notes (Signed)
No allergies to eggs or soy. No prior anesthesia.  No oxygen use  No diet drug use  Interpreter present for PV

## 2015-04-20 ENCOUNTER — Ambulatory Visit (AMBULATORY_SURGERY_CENTER): Payer: Medicaid Other | Admitting: Internal Medicine

## 2015-04-20 ENCOUNTER — Encounter: Payer: Self-pay | Admitting: Internal Medicine

## 2015-04-20 VITALS — BP 109/65 | HR 84 | Temp 96.5°F | Resp 15 | Ht 65.0 in | Wt 177.0 lb

## 2015-04-20 DIAGNOSIS — D122 Benign neoplasm of ascending colon: Secondary | ICD-10-CM | POA: Diagnosis not present

## 2015-04-20 DIAGNOSIS — Z1211 Encounter for screening for malignant neoplasm of colon: Secondary | ICD-10-CM

## 2015-04-20 MED ORDER — SODIUM CHLORIDE 0.9 % IV SOLN: 500.0000 mL | INTRAVENOUS | Status: DC

## 2015-04-20 NOTE — Progress Notes (Signed)
Called to room to assist during endoscopic procedure.  Patient ID and intended procedure confirmed with present staff. Received instructions for my participation in the procedure from the performing physician.  

## 2015-04-20 NOTE — Op Note (Signed)
[facility]  [address] [place], [address]   COLONOSCOPY PROCEDURE REPORT  PATIENT: David Barber, David Barber  MR#: [id number] BIRTHDATE: 05-Mar-1951 , 42  yrs. old GENDER: male ENDOSCOPIST: Eustace Quail, MD REFERRED [id number] Funches, MD PROCEDURE DATE:  04/20/2015 PROCEDURE:   Colonoscopy, screening and Colonoscopy with snare polypectomy x 2 First Screening Colonoscopy - Avg.  risk and is 50 yrs.  old or older Yes.  Prior Negative Screening - Now for repeat screening. N/A  History of Adenoma - Now for follow-up colonoscopy & has been > or = to 3 yrs.  N/A  Polyps removed today? Yes ASA CLASS:   Class II INDICATIONS:Screening for colonic neoplasia and Colorectal Neoplasm Risk Assessment for this procedure is average risk. MEDICATIONS: Monitored anesthesia care and Propofol 220 mg IV  DESCRIPTION OF PROCEDURE:   After the risks benefits and alternatives of the procedure were thoroughly explained, informed consent was obtained.  The digital rectal exam revealed no abnormalities of the rectum.   The LB [id number]  endoscope was introduced through the anus and advanced to the cecum, which was identified by both the appendix and ileocecal valve. No adverse events experienced.   The quality of the prep was excellent. (Suprep was used)  The instrument was then slowly withdrawn as the colon was fully examined. Estimated blood loss is zero unless otherwise noted in this procedure report.     COLON FINDINGS: Two polyps measuring 3 mm in size were found in the ascending colon.  A polypectomy was performed with a cold snare. The resection was complete, the polyp tissue was completely retrieved and sent to histology.   The examination was otherwise normal.  Retroflexed views revealed internal hemorrhoids. The time to cecum = 2.3 Withdrawal time = 10.8   The scope was withdrawn and the procedure completed. COMPLICATIONS: There were no immediate  complications.  ENDOSCOPIC IMPRESSION: 1.   Two polyps were found in the ascending colon; polypectomy was performed with a cold snare 2.   The examination was otherwise normal  RECOMMENDATIONS: 1. Repeat colonoscopy in 5 years if polyp adenomatous; otherwise 10 years  eSigned:  Eustace Quail, MD 04/20/2015 12:00 PM   cc: The Patient and Boykin Nearing, MD

## 2015-04-20 NOTE — Patient Instructions (Signed)
YOU HAD AN ENDOSCOPIC PROCEDURE TODAY AT THE [facility]:   Refer to the procedure report that was given to you for any specific questions about what was found during the examination.  If the procedure report does not answer your questions, please call your gastroenterologist to clarify.  If you requested that your care partner not be given the details of your procedure findings, then the procedure report has been included in a sealed envelope for you to review at your convenience later.  YOU SHOULD EXPECT: Some feelings of bloating in the abdomen. Passage of more gas than usual.  Walking can help get rid of the air that was put into your GI tract during the procedure and reduce the bloating. If you had a lower endoscopy (such as a colonoscopy or flexible sigmoidoscopy) you may notice spotting of blood in your stool or on the toilet paper. If you underwent a bowel prep for your procedure, you may not have a normal bowel movement for a few days.  Please Note:  You might notice some irritation and congestion in your nose or some drainage.  This is from the oxygen used during your procedure.  There is no need for concern and it should clear up in a day or so.  SYMPTOMS TO REPORT IMMEDIATELY:   Following lower endoscopy (colonoscopy or flexible sigmoidoscopy):  Excessive amounts of blood in the stool  Significant tenderness or worsening of abdominal pains  Swelling of the abdomen that is new, acute  Fever of 100F or higher   For urgent or emergent issues, a gastroenterologist can be reached at any hour by calling [phone].   DIET: Your first meal following the procedure should be a small meal and then it is ok to progress to your normal diet. Heavy or fried foods are harder to digest and may make you feel nauseous or bloated.  Likewise, meals heavy in dairy and vegetables can increase bloating.  Drink plenty of fluids but you should avoid alcoholic beverages for 24  hours.  ACTIVITY:  You should plan to take it easy for the rest of today and you should NOT DRIVE or use heavy machinery until tomorrow (because of the sedation medicines used during the test).    FOLLOW UP: Our staff will call the number listed on your records the next business day following your procedure to check on you and address any questions or concerns that you may have regarding the information given to you following your procedure. If we do not reach you, we will leave a message.  However, if you are feeling well and you are not experiencing any problems, there is no need to return our call.  We will assume that you have returned to your regular daily activities without incident.  If any biopsies were taken you will be contacted by phone or by letter within the next 1-3 weeks.  Please call us at [phone] if you have not heard about the biopsies in 3 weeks.    SIGNATURES/CONFIDENTIALITY: You and/or your care partner have signed paperwork which will be entered into your electronic medical record.  These signatures attest to the fact that that the information above on your After Visit Summary has been reviewed and is understood.  Full responsibility of the confidentiality of this discharge information lies with you and/or your care-partner.  Read all of the handouts given to you by your recovery room nurse. 

## 2015-04-20 NOTE — Progress Notes (Signed)
Report to PACU, RN, vss, BBS= Clear.  

## 2015-04-21 ENCOUNTER — Telehealth: Payer: Self-pay | Admitting: *Deleted

## 2015-04-21 NOTE — Telephone Encounter (Signed)
No answer, message left for the patient. 

## 2015-04-23 ENCOUNTER — Encounter: Payer: Self-pay | Admitting: Internal Medicine

## 2015-05-15 ENCOUNTER — Ambulatory Visit: Payer: Medicaid Other | Attending: Family Medicine | Admitting: Family Medicine

## 2015-05-15 ENCOUNTER — Other Ambulatory Visit: Payer: Self-pay | Admitting: Family Medicine

## 2015-05-15 ENCOUNTER — Encounter: Payer: Self-pay | Admitting: Family Medicine

## 2015-05-15 VITALS — BP 117/79 | HR 98 | Temp 98.4°F | Resp 16 | Ht 65.0 in | Wt 177.0 lb

## 2015-05-15 DIAGNOSIS — M545 Low back pain, unspecified: Secondary | ICD-10-CM

## 2015-05-15 DIAGNOSIS — Z23 Encounter for immunization: Secondary | ICD-10-CM

## 2015-05-15 DIAGNOSIS — E559 Vitamin D deficiency, unspecified: Secondary | ICD-10-CM | POA: Diagnosis not present

## 2015-05-15 DIAGNOSIS — Z1159 Encounter for screening for other viral diseases: Secondary | ICD-10-CM | POA: Diagnosis not present

## 2015-05-15 DIAGNOSIS — R7303 Prediabetes: Secondary | ICD-10-CM

## 2015-05-15 DIAGNOSIS — J449 Chronic obstructive pulmonary disease, unspecified: Secondary | ICD-10-CM

## 2015-05-15 DIAGNOSIS — G8929 Other chronic pain: Secondary | ICD-10-CM

## 2015-05-15 LAB — POCT GLYCOSYLATED HEMOGLOBIN (HGB A1C): Hemoglobin A1C: 6.4

## 2015-05-15 LAB — HEPATITIS C ANTIBODY: HCV Ab: NEGATIVE

## 2015-05-15 MED ORDER — FLUTICASONE-SALMETEROL 100-50 MCG/DOSE IN AEPB: 1.0000 | INHALATION_SPRAY | Freq: Two times a day (BID) | RESPIRATORY_TRACT | Status: DC

## 2015-05-15 MED ORDER — TIOTROPIUM BROMIDE MONOHYDRATE 18 MCG IN CAPS: 18.0000 ug | ORAL_CAPSULE | Freq: Every day | RESPIRATORY_TRACT | Status: DC

## 2015-05-15 MED ORDER — ALBUTEROL SULFATE HFA 108 (90 BASE) MCG/ACT IN AERS: 2.0000 | INHALATION_SPRAY | Freq: Four times a day (QID) | RESPIRATORY_TRACT | Status: DC | PRN

## 2015-05-15 NOTE — Progress Notes (Signed)
Used [employer] [phone] Weakness on legs x 15 days  SHOB inhaler not helping  No pain today today  No tobacco user  No suicidal thought in the past two weeks

## 2015-05-15 NOTE — Assessment & Plan Note (Signed)
chronic low back pain with weakness in thighs Normal exam No pain PT

## 2015-05-15 NOTE — Patient Instructions (Addendum)
David Barber was seen today for extremity weakness.  Diagnoses and all orders for this visit:  COPD, mild ([place]) -     Discontinue: Fluticasone-Salmeterol (ADVAIR) 100-50 MCG/DOSE AEPB; Inhale 1 puff into the lungs 2 (two) times daily. -     DG Chest 2 View; Future -     albuterol (PROVENTIL HFA;VENTOLIN HFA) 108 (90 Base) MCG/ACT inhaler; Inhale 2 puffs into the lungs every 6 (six) hours as needed for wheezing or shortness of breath. -     tiotropium (SPIRIVA HANDIHALER) 18 MCG inhalation capsule; Place 1 capsule (18 mcg total) into inhaler and inhale daily.  Chronic low back pain -     Ambulatory referral to Physical Therapy  Vitamin D deficiency -     Vitamin D, 25-hydroxy  Need for hepatitis C screening test -     Hepatitis C antibody, reflex  Prediabetes -     HgB A1c  Other orders -     Tdap vaccine greater than or equal to 7yo IM   F/u in 2 months for shortness of breath/COPD  Dr. Adrian Blackwater

## 2015-05-15 NOTE — Progress Notes (Signed)
Subjective:  Patient ID: David Barber, male    DOB: 20-Jun-1950  Age: 65 y.o. MRN: [id number]  CC: Extremity Weakness   HPI David Barber presents for    1. Leg weakness: x 15 years. In thighs. They feel tired. No back pain. No calf pain. No skin changes. No rash. No fever.   2. SOB: SOB with exertion. No SOB with rest. He is not taking albuterol or Spiriva. Has cough. No CP, fever or chills. Non smoker.   Social History  Substance Use Topics  . Smoking status: Former Smoker    Types: Cigarettes    Quit date: 10/19/2013  . Smokeless tobacco: Current User     Comment: chewing tobacco  . Alcohol Use: 1.8 oz/week    3 Cans of beer per week    Outpatient Prescriptions Prior to Visit  Medication Sig Dispense Refill  . albuterol (PROVENTIL HFA;VENTOLIN HFA) 108 (90 BASE) MCG/ACT inhaler Inhale 2 puffs into the lungs every 6 (six) hours as needed for wheezing or shortness of breath. 1 Inhaler 2  . tiotropium (SPIRIVA HANDIHALER) 18 MCG inhalation capsule Place 1 capsule (18 mcg total) into inhaler and inhale daily. 30 capsule 12  . Vitamin D, Ergocalciferol, (DRISDOL) 50000 UNITS CAPS capsule Take 1 capsule (50,000 Units total) by mouth every 7 (seven) days. For 8 weeks (Patient not taking: Reported on 05/15/2015) 4 capsule 1   No facility-administered medications prior to visit.    ROS Review of Systems  Constitutional: Negative for fever, chills, fatigue and unexpected weight change.  HENT: Negative for ear pain.   Eyes: Negative for visual disturbance.  Respiratory: Positive for cough and shortness of breath (with walking or overeating ).   Cardiovascular: Negative for chest pain, palpitations and leg swelling.  Gastrointestinal: Negative for nausea, vomiting, abdominal pain, diarrhea, constipation and blood in stool.  Endocrine: Negative for polydipsia, polyphagia and polyuria.  Musculoskeletal: Positive for myalgias (in thighs ) and back pain. Negative for  arthralgias, gait problem and neck pain.  Skin: Negative for rash.  Allergic/Immunologic: Negative for immunocompromised state.  Neurological: Positive for headaches. Negative for dizziness and facial asymmetry.  Hematological: Negative for adenopathy. Does not bruise/bleed easily.  Psychiatric/Behavioral: Negative for suicidal ideas, sleep disturbance and dysphoric mood. The patient is not nervous/anxious.     Objective:  BP 117/79 mmHg  Pulse 98  Temp(Src) 98.4 F (36.9 C) (Oral)  Resp 16  Ht 5\' 5"  (1.651 m)  Wt 177 lb (80.287 kg)  BMI 29.45 kg/m2  SpO2 96%  BP/Weight 05/15/2015 04/20/2015 [id number]  Systolic BP 123XX123 0000000 -  Diastolic BP 79 65 -  Wt. (Lbs) 177 177 177.4  BMI 29.45 29.45 29.52   Physical Exam  Constitutional: He appears well-developed and well-nourished. No distress.  HENT:  Head: Normocephalic and atraumatic.  Neck: Normal range of motion. Neck supple. Carotid bruit is not present.  Cardiovascular: Normal rate, regular rhythm, normal heart sounds and intact distal pulses.   Pulmonary/Chest: Effort normal. He has wheezes.  Musculoskeletal: He exhibits no edema.  Back Exam: Back: Normal Curvature, no deformities or CVA tenderness  Paraspinal Tenderness: absent   LE Strength 5/5  LE Sensation: in tact  LE Reflexes 2+ and symmetric  Straight leg raise: neg  Neurological: He is alert.  Skin: Skin is warm and dry. No rash noted. No erythema.  Psychiatric: He has a normal mood and affect.   Lab Results  Component Value Date   HGBA1C 6.4 05/15/2015  Assessment & Plan:   Dima was seen today for extremity weakness.  Diagnoses and all orders for this visit:  COPD, mild ([place]) -     Discontinue: Fluticasone-Salmeterol (ADVAIR) 100-50 MCG/DOSE AEPB; Inhale 1 puff into the lungs 2 (two) times daily. -     DG Chest 2 View; Future -     albuterol (PROVENTIL HFA;VENTOLIN HFA) 108 (90 Base) MCG/ACT inhaler; Inhale 2 puffs into the lungs every 6 (six) hours as  needed for wheezing or shortness of breath. -     tiotropium (SPIRIVA HANDIHALER) 18 MCG inhalation capsule; Place 1 capsule (18 mcg total) into inhaler and inhale daily.  Chronic low back pain -     Ambulatory referral to Physical Therapy  Vitamin D deficiency -     Vitamin D, 25-hydroxy  Need for hepatitis C screening test -     Hepatitis C antibody, reflex  Prediabetes -     HgB A1c  Other orders -     Tdap vaccine greater than or equal to 7yo IM   Follow-up: No Follow-up on file.   Boykin Nearing MD

## 2015-05-15 NOTE — Assessment & Plan Note (Signed)
Mild COPD with med non compliance Normal exam Refilled meds CXR

## 2015-05-15 NOTE — Assessment & Plan Note (Signed)
Stable Lab Results  Component Value Date   HGBA1C 6.4 05/15/2015

## 2015-05-16 LAB — VITAMIN D 25 HYDROXY (VIT D DEFICIENCY, FRACTURES): Vit D, 25-Hydroxy: 15 ng/mL — ABNORMAL LOW (ref 30–100)

## 2015-05-18 MED ORDER — VITAMIN D (ERGOCALCIFEROL) 1.25 MG (50000 UNIT) PO CAPS: 50000.0000 [IU] | ORAL_CAPSULE | ORAL | Status: DC

## 2015-05-18 NOTE — Addendum Note (Signed)
Addended by: Boykin Nearing on: 05/18/2015 01:31 PM   Modules accepted: Orders

## 2015-05-20 ENCOUNTER — Telehealth: Payer: Self-pay | Admitting: *Deleted

## 2015-05-20 NOTE — Telephone Encounter (Signed)
-----   Message from Boykin Nearing, MD sent at 05/18/2015  1:06 PM EST ----- Screening hep C negative

## 2015-05-20 NOTE — Telephone Encounter (Signed)
Used [place] interpreter [place] [phone] Left message with male to return call

## 2015-05-27 NOTE — Telephone Encounter (Signed)
Used [employer] [phone] Left message with male to return call

## 2015-05-27 NOTE — Telephone Encounter (Signed)
-----   Message from Boykin Nearing, MD sent at 05/18/2015  1:30 PM EST ----- Vit D low Vit D ordered

## 2015-07-21 ENCOUNTER — Ambulatory Visit: Payer: Medicaid Other | Attending: Family Medicine | Admitting: Family Medicine

## 2015-07-21 ENCOUNTER — Encounter: Payer: Self-pay | Admitting: Family Medicine

## 2015-07-21 VITALS — BP 122/81 | HR 104 | Temp 97.9°F | Resp 18 | Ht 65.0 in | Wt 189.0 lb

## 2015-07-21 DIAGNOSIS — E559 Vitamin D deficiency, unspecified: Secondary | ICD-10-CM | POA: Insufficient documentation

## 2015-07-21 DIAGNOSIS — Z7982 Long term (current) use of aspirin: Secondary | ICD-10-CM | POA: Insufficient documentation

## 2015-07-21 DIAGNOSIS — E785 Hyperlipidemia, unspecified: Secondary | ICD-10-CM | POA: Insufficient documentation

## 2015-07-21 DIAGNOSIS — Z79899 Other long term (current) drug therapy: Secondary | ICD-10-CM | POA: Insufficient documentation

## 2015-07-21 DIAGNOSIS — H9193 Unspecified hearing loss, bilateral: Secondary | ICD-10-CM | POA: Diagnosis not present

## 2015-07-21 DIAGNOSIS — R7303 Prediabetes: Secondary | ICD-10-CM | POA: Diagnosis not present

## 2015-07-21 DIAGNOSIS — Z7984 Long term (current) use of oral hypoglycemic drugs: Secondary | ICD-10-CM | POA: Insufficient documentation

## 2015-07-21 DIAGNOSIS — G8929 Other chronic pain: Secondary | ICD-10-CM | POA: Diagnosis not present

## 2015-07-21 DIAGNOSIS — J449 Chronic obstructive pulmonary disease, unspecified: Secondary | ICD-10-CM | POA: Diagnosis not present

## 2015-07-21 DIAGNOSIS — Z87891 Personal history of nicotine dependence: Secondary | ICD-10-CM | POA: Diagnosis not present

## 2015-07-21 DIAGNOSIS — M545 Low back pain: Secondary | ICD-10-CM | POA: Insufficient documentation

## 2015-07-21 DIAGNOSIS — E1169 Type 2 diabetes mellitus with other specified complication: Secondary | ICD-10-CM

## 2015-07-21 DIAGNOSIS — E119 Type 2 diabetes mellitus without complications: Secondary | ICD-10-CM | POA: Insufficient documentation

## 2015-07-21 LAB — POCT GLYCOSYLATED HEMOGLOBIN (HGB A1C): Hemoglobin A1C: 6.5

## 2015-07-21 LAB — GLUCOSE, POCT (MANUAL RESULT ENTRY): POC GLUCOSE: 98 mg/dL (ref 70–99)

## 2015-07-21 MED ORDER — ALBUTEROL SULFATE HFA 108 (90 BASE) MCG/ACT IN AERS: 2.0000 | INHALATION_SPRAY | Freq: Four times a day (QID) | RESPIRATORY_TRACT | Status: DC | PRN

## 2015-07-21 MED ORDER — VITAMIN D (ERGOCALCIFEROL) 1.25 MG (50000 UNIT) PO CAPS: 50000.0000 [IU] | ORAL_CAPSULE | ORAL | Status: DC

## 2015-07-21 MED ORDER — TIOTROPIUM BROMIDE MONOHYDRATE 18 MCG IN CAPS: 18.0000 ug | ORAL_CAPSULE | Freq: Every day | RESPIRATORY_TRACT | Status: DC

## 2015-07-21 MED ORDER — METFORMIN HCL ER 500 MG PO TB24: 500.0000 mg | ORAL_TABLET | Freq: Every day | ORAL | Status: DC

## 2015-07-21 MED ORDER — ASPIRIN EC 81 MG PO TBEC: 81.0000 mg | DELAYED_RELEASE_TABLET | Freq: Every day | ORAL | Status: DC

## 2015-07-21 NOTE — Progress Notes (Signed)
Subjective:  Patient ID: David Barber, male    DOB: 02-23-1951  Age: 65 y.o. MRN: [id number] [place] interpreter used  CC: Leg Pain   HPI Alfonso Arroyos presents for   1. Right thigh weakness: for 15 days. No injury. Has fatigue when he walks. Improved with rest. Nocalf pain. He has hx of chronic low back pain but denies worsening of pain.  Has known prediabetes. Has gained weight. Eating moderate to high carb.    2. Hearing loss: both ears for past 18 months. No long term loud noise exposure. No ear pain or ringing in ears.   Social History  Substance Use Topics  . Smoking status: Former Smoker    Types: Cigarettes    Quit date: 10/19/2013  . Smokeless tobacco: Current User     Comment: chewing tobacco  . Alcohol Use: 1.8 oz/week    3 Cans of beer per week    Outpatient Prescriptions Prior to Visit  Medication Sig Dispense Refill  . albuterol (PROVENTIL HFA;VENTOLIN HFA) 108 (90 Base) MCG/ACT inhaler Inhale 2 puffs into the lungs every 6 (six) hours as needed for wheezing or shortness of breath. 1 Inhaler 2  . tiotropium (SPIRIVA HANDIHALER) 18 MCG inhalation capsule Place 1 capsule (18 mcg total) into inhaler and inhale daily. 30 capsule 12  . Vitamin D, Ergocalciferol, (DRISDOL) 50000 units CAPS capsule Take 1 capsule (50,000 Units total) by mouth every 7 (seven) days. For 12 weeks 4 capsule 2   No facility-administered medications prior to visit.    ROS Review of Systems  Constitutional: Negative for fever, chills, fatigue and unexpected weight change.  HENT: Negative for ear pain.   Eyes: Negative for visual disturbance.  Respiratory: Positive for cough and shortness of breath (with walking or overeating ).   Cardiovascular: Negative for chest pain, palpitations and leg swelling.  Gastrointestinal: Negative for nausea, vomiting, abdominal pain, diarrhea, constipation and blood in stool.  Endocrine: Negative for polydipsia, polyphagia and polyuria.    Musculoskeletal: Positive for myalgias (in thighs ) and back pain. Negative for arthralgias, gait problem and neck pain.  Skin: Negative for rash.  Allergic/Immunologic: Negative for immunocompromised state.  Neurological: Positive for headaches. Negative for dizziness and facial asymmetry.  Hematological: Negative for adenopathy. Does not bruise/bleed easily.  Psychiatric/Behavioral: Negative for suicidal ideas, sleep disturbance and dysphoric mood. The patient is not nervous/anxious.     Objective:  BP 122/81 mmHg  Pulse 104  Temp(Src) 97.9 F (36.6 C) (Oral)  Resp 18  Ht 5\' 5"  (1.651 m)  Wt 189 lb (85.73 kg)  BMI 31.45 kg/m2  SpO2 96%  BP/Weight 07/21/2015 05/15/2015 [id number]  Systolic BP 123XX123 123XX123 0000000  Diastolic BP 81 79 65  Wt. (Lbs) 189 177 177  BMI 31.45 29.45 29.45    Physical Exam  Constitutional: He appears well-developed and well-nourished. No distress.  HENT:  Head: Normocephalic and atraumatic.  Right Ear: Tympanic membrane, external ear and ear canal normal.  Left Ear: Tympanic membrane, external ear and ear canal normal.  Neck: Normal range of motion. Neck supple.  Cardiovascular: Normal rate, regular rhythm, normal heart sounds and intact distal pulses.   Pulses:      Femoral pulses are 2+ on the right side. Pulmonary/Chest: Effort normal and breath sounds normal.  Musculoskeletal: He exhibits no edema.  Neurological: He is alert.  Skin: Skin is warm and dry. No rash noted. No erythema.  Psychiatric: He has a normal mood and affect.   Lab  Results  Component Value Date   HGBA1C 6.4 05/15/2015   Lab Results  Component Value Date   HGBA1C 6.5 07/21/2015    Assessment & Plan:   There are no diagnoses linked to this encounter. Charleson was seen today for leg pain.  Diagnoses and all orders for this visit:  COPD, mild ([place]) -     albuterol (PROVENTIL HFA;VENTOLIN HFA) 108 (90 Base) MCG/ACT inhaler; Inhale 2 puffs into the lungs every 6 (six) hours as  needed for wheezing or shortness of breath. -     tiotropium (SPIRIVA HANDIHALER) 18 MCG inhalation capsule; Place 1 capsule (18 mcg total) into inhaler and inhale daily.  Hearing loss, bilateral -     Ambulatory referral to Audiology  Prediabetes -     aspirin EC 81 MG tablet; Take 1 tablet (81 mg total) by mouth daily. -     HgB A1c -     Glucose (CBG) -     metFORMIN (GLUCOPHAGE XR) 500 MG 24 hr tablet; Take 1 tablet (500 mg total) by mouth daily with breakfast. -     BASIC METABOLIC PANEL WITH GFR  Hyperlipidemia -     Lipid Panel  Vitamin D deficiency -     Discontinue: Vitamin D, Ergocalciferol, (DRISDOL) 50000 units CAPS capsule; Take 1 capsule (50,000 Units total) by mouth every 7 (seven) days. For 8 weeks -     Vitamin D, 25-hydroxy    Meds ordered this encounter  Medications  . albuterol (PROVENTIL HFA;VENTOLIN HFA) 108 (90 Base) MCG/ACT inhaler    Sig: Inhale 2 puffs into the lungs every 6 (six) hours as needed for wheezing or shortness of breath.    Dispense:  1 Inhaler    Refill:  2  . tiotropium (SPIRIVA HANDIHALER) 18 MCG inhalation capsule    Sig: Place 1 capsule (18 mcg total) into inhaler and inhale daily.    Dispense:  30 capsule    Refill:  12  . aspirin EC 81 MG tablet    Sig: Take 1 tablet (81 mg total) by mouth daily.    Dispense:  30 tablet    Refill:  5  . Vitamin D, Ergocalciferol, (DRISDOL) 50000 units CAPS capsule    Sig: Take 1 capsule (50,000 Units total) by mouth every 7 (seven) days. For 8 weeks    Dispense:  8 capsule    Refill:  0  . metFORMIN (GLUCOPHAGE XR) 500 MG 24 hr tablet    Sig: Take 1 tablet (500 mg total) by mouth daily with breakfast.    Dispense:  30 tablet    Refill:  3    Follow-up: No Follow-up on file.   Boykin Nearing MD

## 2015-07-21 NOTE — Progress Notes (Signed)
Rt leg pain "Pain moving up" No pain as he sitting  Pain elevated when walking or moving  Unable to hear  Chew tobacco  No suicidal thought in the past two weeks   Used [employer] [phone]

## 2015-07-21 NOTE — Patient Instructions (Addendum)
David Barber was seen today for leg pain.  Diagnoses and all orders for this visit:  COPD, mild ([place]) -     albuterol (PROVENTIL HFA;VENTOLIN HFA) 108 (90 Base) MCG/ACT inhaler; Inhale 2 puffs into the lungs every 6 (six) hours as needed for wheezing or shortness of breath. -     tiotropium (SPIRIVA HANDIHALER) 18 MCG inhalation capsule; Place 1 capsule (18 mcg total) into inhaler and inhale daily.  Hearing loss, bilateral -     Ambulatory referral to Audiology  Prediabetes -     aspirin EC 81 MG tablet; Take 1 tablet (81 mg total) by mouth daily. -     HgB A1c -     Glucose (CBG) -     metFORMIN (GLUCOPHAGE XR) 500 MG 24 hr tablet; Take 1 tablet (500 mg total) by mouth daily with breakfast. -     BASIC METABOLIC PANEL WITH GFR  Hyperlipidemia -     Lipid Panel  Vitamin D deficiency -     Discontinue: Vitamin D, Ergocalciferol, (DRISDOL) 50000 units CAPS capsule; Take 1 capsule (50,000 Units total) by mouth every 7 (seven) days. For 8 weeks -     Vitamin D, 25-hydroxy   F/u in 4  weeks for diabetes   Dr. Adrian Blackwater

## 2015-07-22 LAB — BASIC METABOLIC PANEL WITH GFR
BUN: 11 mg/dL (ref 7–25)
CHLORIDE: 102 mmol/L (ref 98–110)
CO2: 27 mmol/L (ref 20–31)
Calcium: 9.4 mg/dL (ref 8.6–10.3)
Creat: 0.79 mg/dL (ref 0.70–1.25)
GFR, Est African American: >89 mL/min (ref >=60)
Glucose, Bld: 111 mg/dL — ABNORMAL HIGH (ref 65–99)
POTASSIUM: 4.2 mmol/L (ref 3.5–5.3)
SODIUM: 140 mmol/L (ref 135–146)

## 2015-07-22 LAB — LIPID PANEL
CHOL/HDL RATIO: 6.5 ratio — AB (ref <=5.0)
Cholesterol: 209 mg/dL — ABNORMAL HIGH (ref 125–200)
HDL: 32 mg/dL — ABNORMAL LOW (ref >=40)
LDL CALC: 116 mg/dL (ref <130)
TRIGLYCERIDES: 303 mg/dL — AB (ref <150)
VLDL: 61 mg/dL — AB (ref <30)

## 2015-07-22 LAB — VITAMIN D 25 HYDROXY (VIT D DEFICIENCY, FRACTURES): VIT D 25 HYDROXY: 12 ng/mL — AB (ref 30–100)

## 2015-07-23 NOTE — Assessment & Plan Note (Signed)
R thigh muscle pain due to diabetes  Start metformin  Low carb diet Increase exercise

## 2015-07-29 DIAGNOSIS — E785 Hyperlipidemia, unspecified: Secondary | ICD-10-CM

## 2015-07-29 DIAGNOSIS — H9190 Unspecified hearing loss, unspecified ear: Secondary | ICD-10-CM | POA: Insufficient documentation

## 2015-07-29 DIAGNOSIS — E1169 Type 2 diabetes mellitus with other specified complication: Secondary | ICD-10-CM | POA: Insufficient documentation

## 2015-07-29 MED ORDER — VITAMIN D (ERGOCALCIFEROL) 1.25 MG (50000 UNIT) PO CAPS: 50000.0000 [IU] | ORAL_CAPSULE | ORAL | Status: DC

## 2015-07-29 MED ORDER — ATORVASTATIN CALCIUM 40 MG PO TABS: 40.0000 mg | ORAL_TABLET | Freq: Every day | ORAL | Status: DC

## 2015-07-29 NOTE — Addendum Note (Signed)
Addended by: Boykin Nearing on: 07/29/2015 08:46 AM   Modules accepted: Orders

## 2015-08-03 ENCOUNTER — Telehealth: Payer: Self-pay | Admitting: *Deleted

## 2015-08-03 NOTE — Telephone Encounter (Signed)
-----   Message from Boykin Nearing, MD sent at 07/29/2015  8:44 AM EDT ----- Vit D deficiency, sent in vit D High cholesterol, sent in Lipitor 40 mg daily

## 2015-08-03 NOTE — Telephone Encounter (Signed)
Used [place] interpreter Unable to use interpreter, [place] interpreter line busy at this time Will try later

## 2015-10-02 ENCOUNTER — Ambulatory Visit: Payer: Medicaid Other | Attending: Audiology | Admitting: Audiology

## 2015-10-02 DIAGNOSIS — R94128 Abnormal results of other function studies of ear and other special senses: Secondary | ICD-10-CM | POA: Diagnosis present

## 2015-10-02 DIAGNOSIS — H93213 Auditory recruitment, bilateral: Secondary | ICD-10-CM

## 2015-10-02 DIAGNOSIS — H903 Sensorineural hearing loss, bilateral: Secondary | ICD-10-CM | POA: Insufficient documentation

## 2015-10-02 DIAGNOSIS — Z01118 Encounter for examination of ears and hearing with other abnormal findings: Secondary | ICD-10-CM | POA: Diagnosis present

## 2015-10-02 NOTE — Procedures (Signed)
  [facility]  [place], [place] [address]  [phone]   Audiological Evaluation  Patient Name: Phill Steck [facility]  Status: Outpatient   DOB: Oct 10, 1950    Diagnosis: Failed hearing screen MRN: [id number] Date:  10/02/2015     Referent: Dr. Riki Sheer  History: [name] was seen for an audiological evaluation. He was accompanied by a [place] interpreter.  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  However, 1- 1 1/2 years ago he noticed a addition hearing loss and now "can't hear anyone talking behind him" and he "has difficulty hearing people talking in front of him too". He also reports tinnitus that sounds like a "bird chirping". Sometimes "massaging the ear improves the tinnitus". He also reports feeling off-balance and having a spinning sensation at time".    Evaluation: Conventional pure tone audiometry from '250Hz'$  - '8000Hz'$  with using insert earphones. Hearing thresholds are symmetrical ranging from 40-50 dBHL from '250Hz'$  - '500Hz'$  and 55-65 dBHL from '1000Hz'$  - '8000Hz'$  bilaterally. The hearing loss is sensorineural bilaterally. Reliability is good.  Speech detection thresholds are 45 dBHL in each ear using monitored live voice.  Tympanometry (middle ear function) shows normal middle ear volume and pressure with shallow compliance bilaterally (Type As). Ipsilateral acoustic reflexes are absent bilaterally at '1000Hz'$ .   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 This amount of hearing loss will impair communication at ordinary loudness levels.  A hearing aid evaluation is strongly recommended.  Please refer to one of the places listed under recommendation #2 or contact Robin Searing [phone]) to locate a provider of the EDS funded "free hearing aid" for a current list.    RECOMMENDATIONS: 1.  Consider  referral to ENT (such as [facility]) since [place] reports additional hearing loss in past 1-2 years. 2.  Closely monitor hearing with a repeat hearing evaluation in 6-12 months - earlier if there is a change in hearing. 3.  A hearing aid evaluation.  Please note that  [employer] in [facility] may help with obtaining one hearing aid or one captioned telephone if hearing loss and financial qualifications are met.  Please contact Robin Searing at [phone] .  List of Providers for EDS Hearing Aid Distribution [facility] September 19, 2011 - September 17, 2013. The following hearing aid companies have contracted with [facility] to fit hearing aids for eligible applicants. Representatives of these companies are not [employer] [facility] [employer] employees. Inclusion on this list means the company agrees to the terms of contract pricing and inclusion is not an endorsement of their services over those who are not contracted with the division. An applicant may choose any provider listed from your region to assist in obtaining hearing aids through this service. If problems arise during this process, please contact the [place] that provided the application.    [place]  [facility] [place]  [phone]  Neoma Laming L. Heide Spark, Au.D., CCC-A Doctor of Audiology 10/02/2015  cc: Philis Fendt, MD  (per patient request)

## 2016-01-13 ENCOUNTER — Other Ambulatory Visit (HOSPITAL_BASED_OUTPATIENT_CLINIC_OR_DEPARTMENT_OTHER): Payer: Self-pay

## 2016-01-13 DIAGNOSIS — R5383 Other fatigue: Secondary | ICD-10-CM

## 2016-01-13 DIAGNOSIS — R0683 Snoring: Secondary | ICD-10-CM

## 2016-03-04 ENCOUNTER — Ambulatory Visit (HOSPITAL_BASED_OUTPATIENT_CLINIC_OR_DEPARTMENT_OTHER): Payer: Medicaid Other | Attending: Internal Medicine | Admitting: Internal Medicine

## 2016-03-04 VITALS — Ht 64.0 in | Wt 170.0 lb

## 2016-03-04 DIAGNOSIS — E119 Type 2 diabetes mellitus without complications: Secondary | ICD-10-CM

## 2016-03-04 DIAGNOSIS — G4733 Obstructive sleep apnea (adult) (pediatric): Secondary | ICD-10-CM | POA: Diagnosis not present

## 2016-03-04 DIAGNOSIS — R5383 Other fatigue: Secondary | ICD-10-CM | POA: Diagnosis not present

## 2016-03-04 DIAGNOSIS — G4736 Sleep related hypoventilation in conditions classified elsewhere: Secondary | ICD-10-CM

## 2016-03-04 DIAGNOSIS — R0683 Snoring: Secondary | ICD-10-CM

## 2016-03-19 NOTE — Procedures (Signed)
   Patient Name: David Barber, Diggins Date: 03/04/2016 Gender: Male D.O.B: 1951-02-26 Age (years): 65 Referring Provider: Nolene Ebbs Height (inches): 64 Interpreting Physician: Baird Lyons MD, ABSM Weight (lbs): 170 RPSGT: Baxter Flattery BMI: 29 MRN: [id number] Neck Size: 17.00 CLINICAL INFORMATION Sleep Study Type: NPSG  Indication for sleep study: Fatigue, OSA, Snoring, Witnessed Apneas  Epworth Sleepiness Score: 13  SLEEP STUDY TECHNIQUE As per the AASM Manual for the Scoring of Sleep and Associated Events v2.3 (April 2016) with a hypopnea requiring 4% desaturations.  The channels recorded and monitored were frontal, central and occipital EEG, electrooculogram (EOG), submentalis EMG (chin), nasal and oral airflow, thoracic and abdominal wall motion, anterior tibialis EMG, snore microphone, electrocardiogram, and pulse oximetry.  MEDICATIONS Medications self-administered by patient taken the night of the study : none reported  SLEEP ARCHITECTURE The study was initiated at 10:07:14 PM and ended at 4:33:42 AM.  Sleep onset time was 13.4 minutes and the sleep efficiency was 69.0%. The total sleep time was 266.5 minutes.  Stage REM latency was 94.5 minutes.  The patient spent 14.07% of the night in stage N1 sleep, 69.42% in stage N2 sleep, 0.00% in stage N3 and 16.51% in REM.  Alpha intrusion was absent.  Supine sleep was 38.84%.  RESPIRATORY PARAMETERS The overall apnea/hypopnea index (AHI) was 6.1 per hour. There were 10 total apneas, including 10 obstructive, 0 central and 0 mixed apneas. There were 17 hypopneas and 3 RERAs.  The AHI during Stage REM sleep was 30.0 per hour.  AHI while supine was 15.1 per hour.  The mean oxygen saturation was 90.03%. The minimum SpO2 during sleep was 70.00%.  Moderate snoring was noted during this study.  CARDIAC DATA The 2 lead EKG demonstrated sinus rhythm. The mean heart rate was 72.92 beats per minute. Other EKG findings  include: None.  LEG MOVEMENT DATA The total PLMS were 0 with a resulting PLMS index of 0.00. Associated arousal with leg movement index was 0.0 .  IMPRESSIONS - Mild obstructive sleep apnea occurred during this study (AHI = 6.1/h). Most events were during REM. - No significant central sleep apnea occurred during this study (CAI = 0.0/h). - Oxygen desaturation was noted during this study (Min O2 = 70.00%). Mean room air saturation 90%. - The patient snored with Moderate snoring volume. - No cardiac abnormalities were noted during this study. - Clinically significant periodic limb movements did not occur during sleep. No significant associated arousals. - Patient described as restless, with little sleep after 3:00 AM.  DIAGNOSIS - Obstructive Sleep Apnea (327.23 [G47.33 ICD-10]) - Nocturnal Hypoxemia (327.26 [G47.36 ICD-10])  RECOMMENDATIONS - Very mild obstructive sleep apnea. Return to provider to discuss treatment options. - Difficulty maintaining sleep after 3:00 AM. Consider if insomnia should be addressed as a problem. - Avoid alcohol, sedatives and other CNS depressants that may worsen sleep apnea and disrupt normal sleep architecture. - Sleep hygiene should be reviewed to assess factors that may improve sleep quality. - Weight management and regular exercise should be initiated or continued if appropriate.  [Electronically signed] 03/19/2016 11:47 AM  Baird Lyons MD, ABSM Diplomate, American Board of Sleep Medicine   NPI: [id number]  [place], American Board of Sleep Medicine  ELECTRONICALLY SIGNED ON:  03/19/2016, 11:47 AM [place] PH: [phone]   FX: [phone] [place]

## 2016-10-06 ENCOUNTER — Other Ambulatory Visit: Payer: Self-pay

## 2016-10-06 ENCOUNTER — Ambulatory Visit (HOSPITAL_COMMUNITY)
Admission: RE | Admit: 2016-10-06 | Discharge: 2016-10-06 | Disposition: A | Discharge status: Home / Self Care | Payer: Medicaid Other | Source: Ambulatory Visit | Attending: Family Medicine | Admitting: Family Medicine

## 2016-10-06 ENCOUNTER — Encounter: Payer: Self-pay | Admitting: Family Medicine

## 2016-10-06 ENCOUNTER — Ambulatory Visit: Payer: Medicaid Other | Attending: Family Medicine | Admitting: Family Medicine

## 2016-10-06 VITALS — BP 122/89 | HR 92 | Temp 97.8°F | Resp 16 | Wt 183.4 lb

## 2016-10-06 DIAGNOSIS — I208 Other forms of angina pectoris: Secondary | ICD-10-CM | POA: Insufficient documentation

## 2016-10-06 DIAGNOSIS — E119 Type 2 diabetes mellitus without complications: Secondary | ICD-10-CM | POA: Diagnosis not present

## 2016-10-06 DIAGNOSIS — K219 Gastro-esophageal reflux disease without esophagitis: Secondary | ICD-10-CM | POA: Insufficient documentation

## 2016-10-06 DIAGNOSIS — J449 Chronic obstructive pulmonary disease, unspecified: Secondary | ICD-10-CM | POA: Diagnosis not present

## 2016-10-06 DIAGNOSIS — Z7982 Long term (current) use of aspirin: Secondary | ICD-10-CM | POA: Insufficient documentation

## 2016-10-06 DIAGNOSIS — R9431 Abnormal electrocardiogram [ECG] [EKG]: Secondary | ICD-10-CM | POA: Diagnosis not present

## 2016-10-06 DIAGNOSIS — R079 Chest pain, unspecified: Secondary | ICD-10-CM | POA: Diagnosis not present

## 2016-10-06 DIAGNOSIS — E785 Hyperlipidemia, unspecified: Secondary | ICD-10-CM | POA: Insufficient documentation

## 2016-10-06 DIAGNOSIS — M79651 Pain in right thigh: Secondary | ICD-10-CM | POA: Diagnosis not present

## 2016-10-06 DIAGNOSIS — Z79899 Other long term (current) drug therapy: Secondary | ICD-10-CM | POA: Insufficient documentation

## 2016-10-06 DIAGNOSIS — Z7984 Long term (current) use of oral hypoglycemic drugs: Secondary | ICD-10-CM | POA: Diagnosis not present

## 2016-10-06 DIAGNOSIS — Z720 Tobacco use: Secondary | ICD-10-CM | POA: Insufficient documentation

## 2016-10-06 DIAGNOSIS — E1169 Type 2 diabetes mellitus with other specified complication: Secondary | ICD-10-CM

## 2016-10-06 LAB — POCT GLYCOSYLATED HEMOGLOBIN (HGB A1C): HEMOGLOBIN A1C: 6.9

## 2016-10-06 LAB — GLUCOSE, POCT (MANUAL RESULT ENTRY): POC GLUCOSE: 104 mg/dL — AB (ref 70–99)

## 2016-10-06 MED ORDER — ALBUTEROL SULFATE HFA 108 (90 BASE) MCG/ACT IN AERS: 2.0000 | INHALATION_SPRAY | Freq: Four times a day (QID) | RESPIRATORY_TRACT | 2 refills | Status: DC | PRN

## 2016-10-06 MED ORDER — METFORMIN HCL ER 500 MG PO TB24: 500.0000 mg | ORAL_TABLET | Freq: Every day | ORAL | 3 refills | Status: DC

## 2016-10-06 MED ORDER — TIOTROPIUM BROMIDE MONOHYDRATE 18 MCG IN CAPS: 18.0000 ug | ORAL_CAPSULE | Freq: Every day | RESPIRATORY_TRACT | 12 refills | Status: DC

## 2016-10-06 MED ORDER — ATORVASTATIN CALCIUM 40 MG PO TABS: 40.0000 mg | ORAL_TABLET | Freq: Every day | ORAL | 3 refills | Status: DC

## 2016-10-06 MED ORDER — ASPIRIN EC 81 MG PO TBEC: 81.0000 mg | DELAYED_RELEASE_TABLET | Freq: Every day | ORAL | 5 refills | Status: DC

## 2016-10-06 MED ORDER — METHOCARBAMOL 500 MG PO TABS: 500.0000 mg | ORAL_TABLET | Freq: Three times a day (TID) | ORAL | 1 refills | Status: DC | PRN

## 2016-10-06 NOTE — Progress Notes (Signed)
Not taking medications in 4 months Chest pain  Cough Denies dizziness or diaphoretic episodes.  Dizzy episodes 4 mos ago.  Right leg pain

## 2016-10-06 NOTE — Progress Notes (Signed)
Subjective:  Patient ID: David Barber, male    DOB: Feb 16, 1951  Age: 66 y.o. MRN: [id number]  CC: To establish care  HPI David Barber is a 66 year old male with a history of COPD, type 2 diabetes mellitus (A1c 6.9), hyperlipidemia who presents today to establish care with me. He was previously followed by Dr. Adrian Blackwater.  He ran out of his inhalers but denies any shortness of breath or wheezing and has not had any recent COPD exacerbation. He also has not been taking his atorvastatin as prescribed but denies myalgias when he was taking it.  He complains of parasternal chest pain unrelated to activity rated at 6-7 for the last 2 weeks. He denies radiation of pain and denies nausea, diaphoresis. He does not smoke but chews tobacco. EKG performed in the clinic revealed normal sinus rhythm, nonspecific ST changes in anterior leads similar to EKG from 2 years prior.  He also has intermittent right thigh pain with no prior history of trauma, of insidious onset. He states pain has improved and is now at a 3/10.  Past Medical History:  Diagnosis Date  . Bronchitis   . COPD (chronic obstructive pulmonary disease) ([place])   . GERD (gastroesophageal reflux disease)     Past Surgical History:  Procedure Laterality Date  . CYST REMOVAL NECK  2013   benign    No Known Allergies   Outpatient Medications Prior to Visit  Medication Sig Dispense Refill  . albuterol (PROVENTIL HFA;VENTOLIN HFA) 108 (90 Base) MCG/ACT inhaler Inhale 2 puffs into the lungs every 6 (six) hours as needed for wheezing or shortness of breath. (Patient not taking: Reported on 10/06/2016) 1 Inhaler 2  . aspirin EC 81 MG tablet Take 1 tablet (81 mg total) by mouth daily. (Patient not taking: Reported on 10/06/2016) 30 tablet 5  . atorvastatin (LIPITOR) 40 MG tablet Take 1 tablet (40 mg total) by mouth daily. (Patient not taking: Reported on 10/06/2016) 90 tablet 3  . metFORMIN (GLUCOPHAGE XR) 500 MG 24 hr tablet Take  1 tablet (500 mg total) by mouth daily with breakfast. (Patient not taking: Reported on 10/06/2016) 30 tablet 3  . tiotropium (SPIRIVA HANDIHALER) 18 MCG inhalation capsule Place 1 capsule (18 mcg total) into inhaler and inhale daily. (Patient not taking: Reported on 10/06/2016) 30 capsule 12  . Vitamin D, Ergocalciferol, (DRISDOL) 50000 units CAPS capsule Take 1 capsule (50,000 Units total) by mouth every 7 (seven) days. For 8 weeks (Patient not taking: Reported on 10/06/2016) 8 capsule 0   No facility-administered medications prior to visit.     ROS Review of Systems  Constitutional: Negative for activity change and appetite change.  HENT: Negative for sinus pressure and sore throat.   Eyes: Negative for visual disturbance.  Respiratory: Negative for cough, chest tightness and shortness of breath.   Cardiovascular: Positive for chest pain. Negative for leg swelling.  Gastrointestinal: Negative for abdominal distention, abdominal pain, constipation and diarrhea.  Endocrine: Negative.   Genitourinary: Negative for dysuria.  Musculoskeletal:       See hpi  Skin: Negative for rash.  Allergic/Immunologic: Negative.   Neurological: Negative for weakness, light-headedness and numbness.  Psychiatric/Behavioral: Negative for dysphoric mood and suicidal ideas.    Objective:  BP 122/89 (BP Location: Left Arm, Patient Position: Sitting, Cuff Size: Large)   Pulse 92   Temp 97.8 F (36.6 C) (Oral)   Resp 16   Wt 183 lb 6.4 oz (83.2 kg)   SpO2 96%  BMI 31.48 kg/m   BP/Weight 10/06/2016 96/78/9381 0/03/7508  Systolic BP 258 - 527  Diastolic BP 89 - 81  Wt. (Lbs) 183.4 170 189  BMI 31.48 29.18 31.45      Physical Exam  Constitutional: He is oriented to person, place, and time. He appears well-developed and well-nourished.  Neck: No JVD present.  Cardiovascular: Normal rate, normal heart sounds and intact distal pulses.   No murmur heard. Pulmonary/Chest: Effort normal and breath sounds  normal. He has no wheezes. He has no rales. He exhibits no tenderness.  Abdominal: Soft. Bowel sounds are normal. He exhibits no distension and no mass. There is no tenderness.  Musculoskeletal: Normal range of motion. He exhibits no edema or tenderness.  Neurological: He is alert and oriented to person, place, and time.  Skin: Skin is warm and dry.  Psychiatric: He has a normal mood and affect.     Lab Results  Component Value Date   HGBA1C 6.9 10/06/2016    CMP Latest Ref Rng & Units 07/21/2015 06/16/2014 11/23/2012  Glucose 65 - 99 mg/dL 111(H) 102(H) 107(H)  BUN 7 - 25 mg/dL _0 Creatinine 0.70 - 1.25 mg/dL 0.79 0.84 0.70  Sodium 135 - 146 mmol/L 140 142 138  Potassium 3.5 - 5.3 mmol/L 4.2 4.4 4.1  Chloride 98 - 110 mmol/L 102 101 103  CO2 20 - 31 mmol/L _1 Calcium 8.6 - 10.3 mg/dL 9.4 9.5 9.3  Total Protein 6.0 - 8.3 g/dL - 7.8 7.3  Total Bilirubin 0.2 - 1.2 mg/dL - 0.4 0.5  Alkaline Phos 39 - 117 U/L - 126(H) 96  AST 0 - 37 U/L - 37 24  ALT 0 - 53 U/L - 32 22    Lipid Panel     Component Value Date/Time   CHOL 209 (H) 07/21/2015 1625   TRIG 303 (H) 07/21/2015 1625   HDL 32 (L) 07/21/2015 1625   CHOLHDL 6.5 (H) 07/21/2015 1625   VLDL 61 (H) 07/21/2015 1625   LDLCALC 116 07/21/2015 1625    Assessment & Plan:   1. Controlled type 2 diabetes mellitus without complication, without long-term current use of insulin (HCC) Controlled with A1c of 6.9 Continue diabetic diet Lifestyle modifications - CMP14+EGFR; Future - Lipid panel; Future - metFORMIN (GLUCOPHAGE XR) 500 MG 24 hr tablet; Take 1 tablet (500 mg total) by mouth daily with breakfast.  Dispense: 30 tablet; Refill: 3 - aspirin EC 81 MG tablet; Take 1 tablet (81 mg total) by mouth daily.  Dispense: 30 tablet; Refill: 5  2. Stable angina (HCC) EKG revealed nonspecific T-wave changes in anterior lead similar to EKG from 2 years prior Due to underlying history of diabetes mellitus will refer to  cardiology for further workup. - Ambulatory referral to Cardiology  3. Hyperlipidemia associated with type 2 diabetes mellitus (HCC) Low cholesterol diet - atorvastatin (LIPITOR) 40 MG tablet; Take 1 tablet (40 mg total) by mouth daily.  Dispense: 90 tablet; Refill: 3  4. COPD, mild (HCC) No acute exacerbation - albuterol (PROVENTIL HFA;VENTOLIN HFA) 108 (90 Base) MCG/ACT inhaler; Inhale 2 puffs into the lungs every 6 (six) hours as needed for wheezing or shortness of breath.  Dispense: 1 Inhaler; Refill: 2 - tiotropium (SPIRIVA HANDIHALER) 18 MCG inhalation capsule; Place 1 capsule (18 mcg total) into inhaler and inhale daily.  Dispense: 30 capsule; Refill: 12  5. Pain of right thigh Likely muscle spasm Placed on Robaxin   Meds ordered this encounter  Medications  . methocarbamol (ROBAXIN) 500 MG tablet    Sig: Take 1 tablet (500 mg total) by mouth every 8 (eight) hours as needed for muscle spasms.    Dispense:  90 tablet    Refill:  1  . metFORMIN (GLUCOPHAGE XR) 500 MG 24 hr tablet    Sig: Take 1 tablet (500 mg total) by mouth daily with breakfast.    Dispense:  30 tablet    Refill:  3  . aspirin EC 81 MG tablet    Sig: Take 1 tablet (81 mg total) by mouth daily.    Dispense:  30 tablet    Refill:  5  . atorvastatin (LIPITOR) 40 MG tablet    Sig: Take 1 tablet (40 mg total) by mouth daily.    Dispense:  90 tablet    Refill:  3  . albuterol (PROVENTIL HFA;VENTOLIN HFA) 108 (90 Base) MCG/ACT inhaler    Sig: Inhale 2 puffs into the lungs every 6 (six) hours as needed for wheezing or shortness of breath.    Dispense:  1 Inhaler    Refill:  2  . tiotropium (SPIRIVA HANDIHALER) 18 MCG inhalation capsule    Sig: Place 1 capsule (18 mcg total) into inhaler and inhale daily.    Dispense:  30 capsule    Refill:  12    Follow-up: Return in about 3 months (around 01/06/2017) for Follow-up of chronic medical conditions.   Arnoldo Morale MD

## 2016-10-07 ENCOUNTER — Ambulatory Visit: Payer: Medicaid Other | Attending: Family Medicine

## 2016-10-07 DIAGNOSIS — E119 Type 2 diabetes mellitus without complications: Secondary | ICD-10-CM

## 2016-10-08 LAB — CMP14+EGFR
ALBUMIN: 4 g/dL (ref 3.6–4.8)
ALK PHOS: 125 IU/L — AB (ref 39–117)
ALT: 32 IU/L (ref 0–44)
AST: 33 IU/L (ref 0–40)
Albumin/Globulin Ratio: 1.1 — ABNORMAL LOW (ref 1.2–2.2)
BUN / CREAT RATIO: 8 — AB (ref 10–24)
BUN: 7 mg/dL — AB (ref 8–27)
Bilirubin Total: 0.6 mg/dL (ref 0.0–1.2)
CO2: 22 mmol/L (ref 20–29)
CREATININE: 0.86 mg/dL (ref 0.76–1.27)
Calcium: 9.5 mg/dL (ref 8.6–10.2)
Chloride: 102 mmol/L (ref 96–106)
GFR calc Af Amer: 104 mL/min/{1.73_m2} (ref >59)
GFR calc non Af Amer: 90 mL/min/{1.73_m2} (ref >59)
GLUCOSE: 98 mg/dL (ref 65–99)
Globulin, Total: 3.7 g/dL (ref 1.5–4.5)
Potassium: 4.3 mmol/L (ref 3.5–5.2)
Sodium: 142 mmol/L (ref 134–144)
Total Protein: 7.7 g/dL (ref 6.0–8.5)

## 2016-10-08 LAB — LIPID PANEL
CHOLESTEROL TOTAL: 221 mg/dL — AB (ref 100–199)
Chol/HDL Ratio: 6.1 ratio — ABNORMAL HIGH (ref 0.0–5.0)
HDL: 36 mg/dL — ABNORMAL LOW (ref >39)
LDL CALC: 149 mg/dL — AB (ref 0–99)
TRIGLYCERIDES: 182 mg/dL — AB (ref 0–149)
VLDL CHOLESTEROL CAL: 36 mg/dL (ref 5–40)

## 2016-10-10 ENCOUNTER — Other Ambulatory Visit: Payer: Self-pay | Admitting: Family Medicine

## 2016-10-10 DIAGNOSIS — E785 Hyperlipidemia, unspecified: Principal | ICD-10-CM

## 2016-10-10 DIAGNOSIS — E1169 Type 2 diabetes mellitus with other specified complication: Secondary | ICD-10-CM

## 2016-10-10 MED ORDER — ATORVASTATIN CALCIUM 80 MG PO TABS: 80.0000 mg | ORAL_TABLET | Freq: Every day | ORAL | 3 refills | Status: DC

## 2016-11-04 ENCOUNTER — Telehealth: Payer: Self-pay | Admitting: *Deleted

## 2016-11-04 NOTE — Telephone Encounter (Signed)
Medical Assistant used [employer] to contact patient.  Interpreter Name: Fredirick Maudlin Interpreter #: [id number] Patient was not available, [employer] left patient a voicemail. Voicemail states to give a call back to [place] with [facility] at [phone].  !!!Please inform patient of needing to pick up cholesterol medication from the pharmacy and take accordingly!!!

## 2016-11-04 NOTE — Telephone Encounter (Signed)
-----   Message from Arnoldo Morale, MD sent at 10/10/2016  4:54 PM EDT ----- Elevated cholesterol; I have sent an increased dose of atorvastatin to his pharmacy. Adhere to low cholesterol diet.

## 2016-11-04 NOTE — Telephone Encounter (Signed)
Patient verified DOB Patients son called back and was advised of patients atorvastatin being increased and sent to the [facility] pharmacy. No further questions at this time.

## 2016-11-11 ENCOUNTER — Ambulatory Visit: Payer: Medicaid Other | Admitting: Cardiology

## 2017-01-04 ENCOUNTER — Encounter: Payer: Self-pay | Admitting: Family Medicine

## 2017-01-04 ENCOUNTER — Ambulatory Visit: Payer: Medicaid Other | Attending: Family Medicine | Admitting: Family Medicine

## 2017-01-04 VITALS — BP 126/85 | HR 97 | Temp 97.5°F | Ht 65.0 in | Wt 178.4 lb

## 2017-01-04 DIAGNOSIS — K219 Gastro-esophageal reflux disease without esophagitis: Secondary | ICD-10-CM | POA: Insufficient documentation

## 2017-01-04 DIAGNOSIS — Z7982 Long term (current) use of aspirin: Secondary | ICD-10-CM | POA: Diagnosis not present

## 2017-01-04 DIAGNOSIS — Z7984 Long term (current) use of oral hypoglycemic drugs: Secondary | ICD-10-CM | POA: Diagnosis not present

## 2017-01-04 DIAGNOSIS — E785 Hyperlipidemia, unspecified: Secondary | ICD-10-CM | POA: Insufficient documentation

## 2017-01-04 DIAGNOSIS — Z9889 Other specified postprocedural states: Secondary | ICD-10-CM | POA: Insufficient documentation

## 2017-01-04 DIAGNOSIS — L304 Erythema intertrigo: Secondary | ICD-10-CM | POA: Diagnosis not present

## 2017-01-04 DIAGNOSIS — E119 Type 2 diabetes mellitus without complications: Secondary | ICD-10-CM | POA: Diagnosis not present

## 2017-01-04 DIAGNOSIS — J449 Chronic obstructive pulmonary disease, unspecified: Secondary | ICD-10-CM | POA: Insufficient documentation

## 2017-01-04 DIAGNOSIS — Z23 Encounter for immunization: Secondary | ICD-10-CM | POA: Diagnosis not present

## 2017-01-04 DIAGNOSIS — Z79899 Other long term (current) drug therapy: Secondary | ICD-10-CM | POA: Diagnosis not present

## 2017-01-04 DIAGNOSIS — E1169 Type 2 diabetes mellitus with other specified complication: Secondary | ICD-10-CM | POA: Diagnosis not present

## 2017-01-04 LAB — GLUCOSE, POCT (MANUAL RESULT ENTRY): POC GLUCOSE: 122 mg/dL — AB (ref 70–99)

## 2017-01-04 LAB — POCT GLYCOSYLATED HEMOGLOBIN (HGB A1C): HEMOGLOBIN A1C: 6.5

## 2017-01-04 MED ORDER — TIOTROPIUM BROMIDE MONOHYDRATE 18 MCG IN CAPS: 18.0000 ug | ORAL_CAPSULE | Freq: Every day | RESPIRATORY_TRACT | 3 refills | Status: DC

## 2017-01-04 MED ORDER — CLOTRIMAZOLE 1 % EX CREA: 1.0000 "application " | TOPICAL_CREAM | Freq: Two times a day (BID) | CUTANEOUS | 0 refills | Status: DC

## 2017-01-04 MED ORDER — METFORMIN HCL ER 500 MG PO TB24: 500.0000 mg | ORAL_TABLET | Freq: Every day | ORAL | 3 refills | Status: DC

## 2017-01-04 MED ORDER — ALBUTEROL SULFATE HFA 108 (90 BASE) MCG/ACT IN AERS: 2.0000 | INHALATION_SPRAY | Freq: Four times a day (QID) | RESPIRATORY_TRACT | 2 refills | Status: DC | PRN

## 2017-01-04 MED ORDER — ATORVASTATIN CALCIUM 80 MG PO TABS: 80.0000 mg | ORAL_TABLET | Freq: Every day | ORAL | 3 refills | Status: DC

## 2017-01-04 NOTE — Patient Instructions (Signed)
Intertrigo Intertrigo is skin irritation or inflammation (dermatitis) that occurs when folds of skin rub together. The irritation can cause a rash and make skin raw and itchy. This condition most commonly occurs in the skin folds of these areas:  Toes.  Armpits.  Groin.  Belly.  Breasts.  Buttocks.  Intertrigo is not passed from person to person (is not contagious). What are the causes? This condition is caused by heat, moisture, friction, and lack of air circulation. The condition can be made worse by:  Sweat.  Bacteria or a fungus, such as yeast.  What increases the risk? This condition is more likely to occur if you have moisture in your skin folds. It is also more likely to develop in people who:  Have diabetes.  Are overweight.  Are on bed rest.  Live in a warm and moist climate.  Wear splints, braces, or other medical devices.  Are not able to control their bowels or bladder (have incontinence).  What are the signs or symptoms? Symptoms of this condition include:  A pink or red skin rash.  Brown patches on the skin.  Raw or scaly skin.  Itchiness.  A burning feeling.  Bleeding.  Leaking fluid.  A bad smell.  How is this diagnosed? This condition is diagnosed with a medical history and physical exam. You may also have a skin swab to test for bacteria or a fungus, such as yeast. How is this treated? Treatment may include:  Cleaning and drying your skin.  An oral antibiotic medicine or antibiotic skin cream for a bacterial infection.  Antifungal cream or pills for an infection that was caused by a fungus, such as yeast.  Steroid ointment to relieve itchiness and irritation.  Follow these instructions at home:  Keep the affected area clean and dry.  Do not scratch your skin.  Stay in a cool environment as much as possible. Use an air conditioner or fan, if available.  Apply over-the-counter and prescription medicines only as told by your  health care provider.  If you were prescribed an antibiotic medicine, use it as told by your health care provider. Do not stop using the antibiotic even if your condition improves.  Keep all follow-up visits as told by your health care provider. This is important. How is this prevented?  Maintain a healthy weight.  Take care of your feet, especially if you have diabetes. Foot care includes: ? Wearing shoes that fit well. ? Keeping your feet dry. ? Wearing clean, breathable socks.  Protect the skin around your groin and buttocks, especially if you have incontinence. Skin protection includes: ? Following a regular cleaning routine. ? Using moisturizers and skin protectants. ? Changing protection pads frequently.  Do not wear tight clothes. Wear clothes that are loose and absorbent. Wear clothes that are made of cotton.  Wear a bra that gives good support, if needed.  Shower and dry yourself thoroughly after activity. Use a hair dryer on a cool setting to dry between skin folds, especially after you bathe.  If you have diabetes, keep your blood sugar under control. Contact a health care provider if:  Your symptoms do not improve with treatment.  Your symptoms get worse or they spread.  You notice increased redness and warmth.  You have a fever. This information is not intended to replace advice given to you by your health care provider. Make sure you discuss any questions you have with your health care provider. Document Released: 03/07/2005 Document Revised: 08/13/2015   Document Reviewed: 09/08/2014 Elsevier Interactive Patient Education  2018 [employer]  

## 2017-01-05 LAB — MICROALBUMIN / CREATININE URINE RATIO
CREATININE, UR: 88.8 mg/dL
Microalb/Creat Ratio: 3.7 mg/g creat (ref 0.0–30.0)
Microalbumin, Urine: 3.3 ug/mL

## 2017-01-05 NOTE — Progress Notes (Signed)
Subjective:  Patient ID: David Barber, male    DOB: 1950-06-04  Age: 66 y.o. MRN: [id number]  CC: Diabetes   HPI Sutton Hirsch  is a 66 year old male with a history of COPD, type 2 diabetes mellitus (A1c 6.5), hyperlipidemia who presents today accompanied by a family member for this follow-up visit and he is being seen with the aid of a Nepali video interpreter.  He complains of pruritus in his genitals for the last week and endorses the presence of a reddish rash but no bleeding. He has not used any topical creams for his symptoms.  He has been compliant with metformin for his diabetes and denies hypoglycemia, numbness in extremities or visual complaints. He has not had a recent eye exam.  He has been compliant with his statin and denies myalgias. Last lipid panel from 09/2016 was elevated.   Past Medical History:  Diagnosis Date  . Bronchitis   . COPD (chronic obstructive pulmonary disease) ([place])   . GERD (gastroesophageal reflux disease)     Past Surgical History:  Procedure Laterality Date  . CYST REMOVAL NECK  2013   benign    No Known Allergies   Outpatient Medications Prior to Visit  Medication Sig Dispense Refill  . aspirin EC 81 MG tablet Take 1 tablet (81 mg total) by mouth daily. 30 tablet 5  . albuterol (PROVENTIL HFA;VENTOLIN HFA) 108 (90 Base) MCG/ACT inhaler Inhale 2 puffs into the lungs every 6 (six) hours as needed for wheezing or shortness of breath. 1 Inhaler 2  . atorvastatin (LIPITOR) 80 MG tablet Take 1 tablet (80 mg total) by mouth daily. 30 tablet 3  . metFORMIN (GLUCOPHAGE XR) 500 MG 24 hr tablet Take 1 tablet (500 mg total) by mouth daily with breakfast. 30 tablet 3  . tiotropium (SPIRIVA HANDIHALER) 18 MCG inhalation capsule Place 1 capsule (18 mcg total) into inhaler and inhale daily. 30 capsule 12  . methocarbamol (ROBAXIN) 500 MG tablet Take 1 tablet (500 mg total) by mouth every 8 (eight) hours as needed for muscle spasms. (Patient  not taking: Reported on 01/04/2017) 90 tablet 1   No facility-administered medications prior to visit.     ROS Review of Systems  Constitutional: Negative for activity change and appetite change.  HENT: Negative for sinus pressure and sore throat.   Eyes: Negative for visual disturbance.  Respiratory: Negative for cough, chest tightness and shortness of breath.   Cardiovascular: Negative for chest pain and leg swelling.  Gastrointestinal: Negative for abdominal distention, abdominal pain, constipation and diarrhea.  Endocrine: Negative.   Genitourinary: Negative for dysuria.  Musculoskeletal: Negative for joint swelling and myalgias.  Skin: Negative for rash.  Allergic/Immunologic: Negative.   Neurological: Negative for weakness, light-headedness and numbness.  Psychiatric/Behavioral: Negative for dysphoric mood and suicidal ideas.    Objective:  BP 126/85   Pulse 97   Temp (!) 97.5 F (36.4 C) (Oral)   Ht 5\' 5"  (1.651 m)   Wt 178 lb 6.4 oz (80.9 kg)   SpO2 92%   BMI 29.69 kg/m   BP/Weight 01/04/2017 10/06/2016 43/32/9518  Systolic BP 841 660 -  Diastolic BP 85 89 -  Wt. (Lbs) 178.4 183.4 170  BMI 29.69 31.48 29.18      Physical Exam  Constitutional: He is oriented to person, place, and time. He appears well-developed and well-nourished.  Cardiovascular: Normal rate, normal heart sounds and intact distal pulses.   No murmur heard. Pulmonary/Chest: Effort normal and breath sounds  normal. He has no wheezes. He has no rales. He exhibits no tenderness.  Abdominal: Soft. Bowel sounds are normal. He exhibits no distension and no mass. There is no tenderness.  Genitourinary:  Genitourinary Comments: Declined  Musculoskeletal: Normal range of motion.  Neurological: He is alert and oriented to person, place, and time.  Psychiatric: He has a normal mood and affect.      CMP Latest Ref Rng & Units 10/07/2016 07/21/2015 06/16/2014  Glucose 65 - 99 mg/dL 98 111(H) 102(H)  BUN  8 - 27 mg/dL 7(L) 11 10  Creatinine 0.76 - 1.27 mg/dL 0.86 0.79 0.84  Sodium 134 - 144 mmol/L 142 140 142  Potassium 3.5 - 5.2 mmol/L 4.3 4.2 4.4  Chloride 96 - 106 mmol/L 102 102 101  CO2 20 - 29 mmol/L 22 27 22   Calcium 8.6 - 10.2 mg/dL 9.5 9.4 9.5  Total Protein 6.0 - 8.5 g/dL 7.7 - 7.8  Total Bilirubin 0.0 - 1.2 mg/dL 0.6 - 0.4  Alkaline Phos 39 - 117 IU/L 125(H) - 126(H)  AST 0 - 40 IU/L 33 - 37  ALT 0 - 44 IU/L 32 - 32    Lipid Panel     Component Value Date/Time   CHOL 221 (H) 10/07/2016 0856   TRIG 182 (H) 10/07/2016 0856   HDL 36 (L) 10/07/2016 0856   CHOLHDL 6.1 (H) 10/07/2016 0856   CHOLHDL 6.5 (H) 07/21/2015 1625   VLDL 61 (H) 07/21/2015 1625   LDLCALC 149 (H) 10/07/2016 0856    Lab Results  Component Value Date   HGBA1C 6.5 01/04/2017    Assessment & Plan:   1. Controlled type 2 diabetes mellitus without complication, without long-term current use of insulin (HCC) Controlled with A1c of 6.5 Diabetic diet, lifestyle modifications. - POCT glucose (manual entry) - POCT glycosylated hemoglobin (Hb A1C) - metFORMIN (GLUCOPHAGE XR) 500 MG 24 hr tablet; Take 1 tablet (500 mg total) by mouth daily with breakfast.  Dispense: 30 tablet; Refill: 3 - Ambulatory referral to Ophthalmology - Microalbumin/Creatinine Ratio, Urine  2. Hyperlipidemia associated with type 2 diabetes mellitus ([place]) Uncontrolled from last set of labs Continue statin, low cholesterol diet Lipid panel at next visit - atorvastatin (LIPITOR) 80 MG tablet; Take 1 tablet (80 mg total) by mouth daily.  Dispense: 30 tablet; Refill: 3  3. COPD, mild (HCC) No acute exacerbation - albuterol (PROVENTIL HFA;VENTOLIN HFA) 108 (90 Base) MCG/ACT inhaler; Inhale 2 puffs into the lungs every 6 (six) hours as needed for wheezing or shortness of breath.  Dispense: 1 Inhaler; Refill: 2 - tiotropium (SPIRIVA HANDIHALER) 18 MCG inhalation capsule; Place 1 capsule (18 mcg total) into inhaler and inhale daily.   Dispense: 30 capsule; Refill: 3  4. Need for influenza vaccination - Flu Vaccine QUAD 36+ mos IM  5. Need for pneumococcal vaccination - Pneumococcal conjugate vaccine 13-valent IM  6. Intertrigo He refuses genitourinary examination - clotrimazole (LOTRIMIN) 1 % cream; Apply 1 application topically 2 (two) times daily.  Dispense: 30 g; Refill: 0   Meds ordered this encounter  Medications  . metFORMIN (GLUCOPHAGE XR) 500 MG 24 hr tablet    Sig: Take 1 tablet (500 mg total) by mouth daily with breakfast.    Dispense:  30 tablet    Refill:  3  . atorvastatin (LIPITOR) 80 MG tablet    Sig: Take 1 tablet (80 mg total) by mouth daily.    Dispense:  30 tablet    Refill:  3  Discontinue previous dose  . albuterol (PROVENTIL HFA;VENTOLIN HFA) 108 (90 Base) MCG/ACT inhaler    Sig: Inhale 2 puffs into the lungs every 6 (six) hours as needed for wheezing or shortness of breath.    Dispense:  1 Inhaler    Refill:  2  . tiotropium (SPIRIVA HANDIHALER) 18 MCG inhalation capsule    Sig: Place 1 capsule (18 mcg total) into inhaler and inhale daily.    Dispense:  30 capsule    Refill:  3  . clotrimazole (LOTRIMIN) 1 % cream    Sig: Apply 1 application topically 2 (two) times daily.    Dispense:  30 g    Refill:  0    Follow-up: follow up in 3 months   Arnoldo Morale MD

## 2017-02-15 ENCOUNTER — Ambulatory Visit: Payer: Medicaid Other | Attending: Family Medicine | Admitting: Physician Assistant

## 2017-02-15 ENCOUNTER — Other Ambulatory Visit: Payer: Self-pay

## 2017-02-15 VITALS — BP 121/83 | HR 106 | Resp 16 | Wt 181.0 lb

## 2017-02-15 DIAGNOSIS — E119 Type 2 diabetes mellitus without complications: Secondary | ICD-10-CM | POA: Insufficient documentation

## 2017-02-15 DIAGNOSIS — H1010 Acute atopic conjunctivitis, unspecified eye: Secondary | ICD-10-CM | POA: Insufficient documentation

## 2017-02-15 DIAGNOSIS — J449 Chronic obstructive pulmonary disease, unspecified: Secondary | ICD-10-CM | POA: Insufficient documentation

## 2017-02-15 DIAGNOSIS — Z79899 Other long term (current) drug therapy: Secondary | ICD-10-CM | POA: Diagnosis not present

## 2017-02-15 DIAGNOSIS — Z7984 Long term (current) use of oral hypoglycemic drugs: Secondary | ICD-10-CM | POA: Diagnosis not present

## 2017-02-15 DIAGNOSIS — Z789 Other specified health status: Secondary | ICD-10-CM

## 2017-02-15 DIAGNOSIS — Z8669 Personal history of other diseases of the nervous system and sense organs: Secondary | ICD-10-CM | POA: Diagnosis not present

## 2017-02-15 DIAGNOSIS — Z7982 Long term (current) use of aspirin: Secondary | ICD-10-CM | POA: Diagnosis not present

## 2017-02-15 DIAGNOSIS — K219 Gastro-esophageal reflux disease without esophagitis: Secondary | ICD-10-CM | POA: Diagnosis not present

## 2017-02-15 DIAGNOSIS — H5789 Other specified disorders of eye and adnexa: Secondary | ICD-10-CM | POA: Diagnosis present

## 2017-02-15 LAB — GLUCOSE, POCT (MANUAL RESULT ENTRY): POC GLUCOSE: 152 mg/dL — AB (ref 70–99)

## 2017-02-15 MED ORDER — CETIRIZINE HCL 10 MG PO TABS: 10.0000 mg | ORAL_TABLET | Freq: Every day | ORAL | 11 refills | Status: DC

## 2017-02-15 MED ORDER — KETOTIFEN FUMARATE 0.025 % OP SOLN: 1.0000 [drp] | Freq: Two times a day (BID) | OPHTHALMIC | 1 refills | Status: DC

## 2017-02-15 MED FILL — ALL DAY ALLERGY 10 MG TAB: 10 | 30 days supply | Qty: 30 | Fill #0

## 2017-02-15 NOTE — Progress Notes (Signed)
Eyes are very watery at night , feels ok during the day time Pain at night in eyes

## 2017-02-15 NOTE — Progress Notes (Signed)
Patient ID: David Barber, male   DOB: [birth year], 65 y.o.   MRN: [id number]     David Barber, is a 66 y.o. male  XIP:[id number]  QBH:[id number]  DOB - 03-03-1951  Subjective:  Chief Complaint and HPI: David Barber is a 66 y.o. male here today for eye itching for about 2 weeks.  He doesn't think he has ever had a diabetic eye exam.  No eye pain.  Symptoms worse at night.  No photophobia.  No vision changes.  No sneezing, runny nose.  +Increased lacrimation.  Denies s/sx of hyper/hypoglycemia.   ROS:   Constitutional:  No f/c, No night sweats, No unexplained weight loss. EENT:  No vision changes, No blurry vision, No hearing changes. No mouth, throat, or ear problems.  Respiratory: No cough, No SOB Cardiac: No CP, no palpitations GI:  No abd pain, No N/V/D. GU: No Urinary s/sx Musculoskeletal: No joint pain Neuro: No headache, no dizziness, no motor weakness.  Skin: No rash Endocrine:  No polydipsia. No polyuria.  Psych: Denies SI/HI  No problems updated.  ALLERGIES: No Known Allergies  PAST MEDICAL HISTORY: Past Medical History:  Diagnosis Date  . Bronchitis   . COPD (chronic obstructive pulmonary disease) ([place])   . GERD (gastroesophageal reflux disease)     MEDICATIONS AT HOME: Prior to Admission medications   Medication Sig Start Date End Date Taking? Authorizing Provider  albuterol (PROVENTIL HFA;VENTOLIN HFA) 108 (90 Base) MCG/ACT inhaler Inhale 2 puffs into the lungs every 6 (six) hours as needed for wheezing or shortness of breath. 01/04/17  Yes Arnoldo Morale, MD  aspirin EC 81 MG tablet Take 1 tablet (81 mg total) by mouth daily. 10/06/16  Yes Arnoldo Morale, MD  atorvastatin (LIPITOR) 80 MG tablet Take 1 tablet (80 mg total) by mouth daily. 01/04/17  Yes Arnoldo Morale, MD  clotrimazole (LOTRIMIN) 1 % cream Apply 1 application topically 2 (two) times daily. 01/04/17  Yes Arnoldo Morale, MD  metFORMIN (GLUCOPHAGE XR) 500 MG 24 hr tablet Take 1 tablet (500 mg total)  by mouth daily with breakfast. 01/04/17  Yes Amao, Enobong, MD  tiotropium (SPIRIVA HANDIHALER) 18 MCG inhalation capsule Place 1 capsule (18 mcg total) into inhaler and inhale daily. 01/04/17  Yes Arnoldo Morale, MD  cetirizine (ZYRTEC) 10 MG tablet Take 1 tablet (10 mg total) by mouth daily. 02/15/17   Argentina Donovan, PA-C  ketotifen (ZADITOR) 0.025 % ophthalmic solution Place 1 drop into both eyes 2 (two) times daily. 02/15/17   Argentina Donovan, PA-C     Objective:  EXAM:   Vitals:   02/15/17 1024  BP: 121/83  Pulse: (!) 106  Resp: 16  Weight: 181 lb (82.1 kg)    General appearance : A&OX3. NAD. Non-toxic-appearing HEENT: Atraumatic and Normocephalic.  PERRLA. EOM intact. Fundi benign.  B conjunctivae with minimal erythema.  No discharge.  TM clear B. Mouth-MMM, post pharynx WNL w/o erythema, No PND. Neck: supple, no JVD. No cervical lymphadenopathy. No thyromegaly Chest/Lungs:  Breathing-non-labored, Good air entry bilaterally, breath sounds normal without rales, rhonchi, or wheezing  CVS: S1 S2 regular, no murmurs, gallops, rubs  Extremities: Bilateral Lower Ext shows no edema, both legs are warm to touch with = pulse throughout Neurology:  CN II-XII grossly intact, Non focal.   Psych:  TP linear. J/I WNL. Normal speech. Appropriate eye contact and affect.  Skin:  No Rash  Data Review Lab Results  Component Value Date   HGBA1C 6.5 01/04/2017   HGBA1C  6.9 10/06/2016   HGBA1C 6.5 07/21/2015     Assessment & Plan   1. Controlled type 2 diabetes mellitus without complication, without long-term current use of insulin (HCC) Stable-continue current regimen and work on diabetic diet.   - Glucose (CBG) - Ambulatory referral to Ophthalmology-needs diabetic eye exam.    2. itching of eye/allergic conjunctivitis - Ambulatory referral to Ophthalmology - cetirizine (ZYRTEC) 10 MG tablet; Take 1 tablet (10 mg total) by mouth daily.  Dispense: 30 tablet; Refill: 11 -  ketotifen (ZADITOR) 0.025 % ophthalmic solution; Place 1 drop into both eyes 2 (two) times daily.  Dispense: 5 mL; Refill: 1  3. Language barrier-stratus interpreters used and additional time performing visit was required.  Patient have been counseled extensively about nutrition and exercise  Return in about 2 months (around 04/17/2017) for Dr Margarita Rana for DM f/up.  The patient was given clear instructions to go to ER or return to medical center if symptoms don't improve, worsen or new problems develop. The patient verbalized understanding. The patient was told to call to get lab results if they haven't heard anything in the next week.     Freeman Caldron, PA-C [facility] and [facility] [place], [place]   02/15/2017, 10:49 AM

## 2017-04-18 ENCOUNTER — Encounter: Payer: Self-pay | Admitting: Family Medicine

## 2017-04-18 ENCOUNTER — Ambulatory Visit: Payer: Medicaid Other | Attending: Family Medicine | Admitting: Family Medicine

## 2017-04-18 VITALS — BP 103/70 | HR 94 | Temp 97.6°F | Ht 65.0 in | Wt 179.8 lb

## 2017-04-18 DIAGNOSIS — E1165 Type 2 diabetes mellitus with hyperglycemia: Secondary | ICD-10-CM | POA: Diagnosis not present

## 2017-04-18 DIAGNOSIS — E1169 Type 2 diabetes mellitus with other specified complication: Secondary | ICD-10-CM | POA: Diagnosis not present

## 2017-04-18 DIAGNOSIS — Z9889 Other specified postprocedural states: Secondary | ICD-10-CM | POA: Diagnosis not present

## 2017-04-18 DIAGNOSIS — K5909 Other constipation: Secondary | ICD-10-CM

## 2017-04-18 DIAGNOSIS — K5900 Constipation, unspecified: Secondary | ICD-10-CM | POA: Diagnosis not present

## 2017-04-18 DIAGNOSIS — Z7982 Long term (current) use of aspirin: Secondary | ICD-10-CM | POA: Insufficient documentation

## 2017-04-18 DIAGNOSIS — Z79899 Other long term (current) drug therapy: Secondary | ICD-10-CM | POA: Insufficient documentation

## 2017-04-18 DIAGNOSIS — J449 Chronic obstructive pulmonary disease, unspecified: Secondary | ICD-10-CM | POA: Insufficient documentation

## 2017-04-18 DIAGNOSIS — E119 Type 2 diabetes mellitus without complications: Secondary | ICD-10-CM

## 2017-04-18 DIAGNOSIS — K219 Gastro-esophageal reflux disease without esophagitis: Secondary | ICD-10-CM | POA: Insufficient documentation

## 2017-04-18 DIAGNOSIS — Z7984 Long term (current) use of oral hypoglycemic drugs: Secondary | ICD-10-CM | POA: Diagnosis not present

## 2017-04-18 DIAGNOSIS — E785 Hyperlipidemia, unspecified: Secondary | ICD-10-CM | POA: Diagnosis not present

## 2017-04-18 LAB — GLUCOSE, POCT (MANUAL RESULT ENTRY): POC Glucose: 120 mg/dl — AB (ref 70–99)

## 2017-04-18 LAB — POCT GLYCOSYLATED HEMOGLOBIN (HGB A1C): Hemoglobin A1C: 6.9

## 2017-04-18 MED ORDER — PREDNISONE 20 MG PO TABS: 20.0000 mg | ORAL_TABLET | Freq: Every day | ORAL | 0 refills | Status: DC

## 2017-04-18 MED ORDER — ALBUTEROL SULFATE HFA 108 (90 BASE) MCG/ACT IN AERS: 2.0000 | INHALATION_SPRAY | Freq: Four times a day (QID) | RESPIRATORY_TRACT | 5 refills | Status: DC | PRN

## 2017-04-18 MED ORDER — ASPIRIN EC 81 MG PO TBEC: 81.0000 mg | DELAYED_RELEASE_TABLET | Freq: Every day | ORAL | 5 refills | Status: DC

## 2017-04-18 MED ORDER — POLYETHYLENE GLYCOL 3350 17 GM/SCOOP PO POWD: 17.0000 g | Freq: Once | ORAL | 1 refills | Status: AC

## 2017-04-18 MED ORDER — METFORMIN HCL ER 500 MG PO TB24: 500.0000 mg | ORAL_TABLET | Freq: Two times a day (BID) | ORAL | 5 refills | Status: DC

## 2017-04-18 MED ORDER — TIOTROPIUM BROMIDE MONOHYDRATE 18 MCG IN CAPS: 18.0000 ug | ORAL_CAPSULE | Freq: Every day | RESPIRATORY_TRACT | 5 refills | Status: DC

## 2017-04-18 MED ORDER — ATORVASTATIN CALCIUM 80 MG PO TABS: 80.0000 mg | ORAL_TABLET | Freq: Every day | ORAL | 5 refills | Status: DC

## 2017-04-18 NOTE — Progress Notes (Signed)
Patient has been out of medication for 3 days. Patient is having constipation.

## 2017-04-18 NOTE — Patient Instructions (Signed)

## 2017-04-18 NOTE — Progress Notes (Signed)
Subjective:  Patient ID: David Barber, male    DOB: Apr 20, 1950  Age: 67 y.o. MRN: [id number]  CC: Diabetes   HPI David Barber is a 67 year old male with a history of COPD, type 2 diabetes mellitus (A1c 6.9), hyperlipidemia who presents for this follow-up visit and is being seen with the aid of a Nepali video interpreter.  He has been out of his inhalers for COPD and complains of persistent cough which is nonproductive and is worse in the evening with associated wheezing but no shortness of breath or chest pains.  His symptoms were better controlled while he had his inhalers he says.  With regards to his diabetes mellitus his A1c is 6.9 and has trended up from 6.5 previously and he endorses compliance with his metforminb has not been exercising.  His diet consists mostly of starches but he does not ingest a lot of sweets. Denies visual concerns or numbness in extremities. He has been compliant with atorvastatin which was raised at his last visit due to uncontrolled lipids and he denies adverse effects or myalgias with his statin.  He complains of constipation and last moved his bowels about 3-4 days ago.  Denies nausea, vomiting but sometimes has abdominal pain when he is constipated.  Past Medical History:  Diagnosis Date  . Bronchitis   . COPD (chronic obstructive pulmonary disease) ([place])   . GERD (gastroesophageal reflux disease)     Past Surgical History:  Procedure Laterality Date  . CYST REMOVAL NECK  2013   benign     No Known Allergies   Outpatient Medications Prior to Visit  Medication Sig Dispense Refill  . cetirizine (ZYRTEC) 10 MG tablet Take 1 tablet (10 mg total) by mouth daily. (Patient not taking: Reported on 04/18/2017) 30 tablet 11  . ketotifen (ZADITOR) 0.025 % ophthalmic solution Place 1 drop into both eyes 2 (two) times daily. (Patient not taking: Reported on 04/18/2017) 5 mL 1  . albuterol (PROVENTIL HFA;VENTOLIN HFA) 108 (90 Base) MCG/ACT  inhaler Inhale 2 puffs into the lungs every 6 (six) hours as needed for wheezing or shortness of breath. (Patient not taking: Reported on 04/18/2017) 1 Inhaler 2  . aspirin EC 81 MG tablet Take 1 tablet (81 mg total) by mouth daily. (Patient not taking: Reported on 04/18/2017) 30 tablet 5  . atorvastatin (LIPITOR) 80 MG tablet Take 1 tablet (80 mg total) by mouth daily. (Patient not taking: Reported on 04/18/2017) 30 tablet 3  . clotrimazole (LOTRIMIN) 1 % cream Apply 1 application topically 2 (two) times daily. (Patient not taking: Reported on 04/18/2017) 30 g 0  . metFORMIN (GLUCOPHAGE XR) 500 MG 24 hr tablet Take 1 tablet (500 mg total) by mouth daily with breakfast. (Patient not taking: Reported on 04/18/2017) 30 tablet 3  . tiotropium (SPIRIVA HANDIHALER) 18 MCG inhalation capsule Place 1 capsule (18 mcg total) into inhaler and inhale daily. (Patient not taking: Reported on 04/18/2017) 30 capsule 3   No facility-administered medications prior to visit.     ROS Review of Systems  Constitutional: Negative for activity change and appetite change.  HENT: Negative for sinus pressure and sore throat.   Eyes: Negative for visual disturbance.  Respiratory: Negative for cough, chest tightness and shortness of breath.   Cardiovascular: Negative for chest pain and leg swelling.  Gastrointestinal: Positive for constipation. Negative for abdominal distention, abdominal pain and diarrhea.  Endocrine: Negative.   Genitourinary: Negative for dysuria.  Musculoskeletal: Negative for joint swelling and myalgias.  Skin: Negative for rash.  Allergic/Immunologic: Negative.   Neurological: Negative for weakness, light-headedness and numbness.  Psychiatric/Behavioral: Negative for dysphoric mood and suicidal ideas.    Objective:  BP 103/70   Pulse 94   Temp 97.6 F (36.4 C) (Oral)   Ht _0  (1.651 m)   Wt 179 lb 12.8 oz (81.6 kg)   SpO2 92%   BMI 29.92 kg/m   BP/Weight 04/18/2017 02/15/2017 09/11/7626    Systolic BP 315 176 160  Diastolic BP 70 83 85  Wt. (Lbs) 179.8 181 178.4  BMI 29.92 30.12 29.69      Physical Exam  Constitutional: He is oriented to person, place, and time. He appears well-developed and well-nourished.  Cardiovascular: Normal rate, normal heart sounds and intact distal pulses.  No murmur heard. Pulmonary/Chest: Effort normal and breath sounds normal. He has no wheezes. He has no rales. He exhibits no tenderness.  Abdominal: Soft. Bowel sounds are normal. He exhibits no distension and no mass. There is no tenderness.  Musculoskeletal: Normal range of motion.  Neurological: He is alert and oriented to person, place, and time.  Psychiatric: He has a normal mood and affect.     CMP Latest Ref Rng & Units 10/07/2016 07/21/2015 06/16/2014  Glucose 65 - 99 mg/dL 98 111(H) 102(H)  BUN 8 - 27 mg/dL 7(L) 11 10  Creatinine 0.76 - 1.27 mg/dL 0.86 0.79 0.84  Sodium 134 - 144 mmol/L 142 140 142  Potassium 3.5 - 5.2 mmol/L 4.3 4.2 4.4  Chloride 96 - 106 mmol/L 102 102 101  CO2 20 - 29 mmol/L _1 Calcium 8.6 - 10.2 mg/dL 9.5 9.4 9.5  Total Protein 6.0 - 8.5 g/dL 7.7 - 7.8  Total Bilirubin 0.0 - 1.2 mg/dL 0.6 - 0.4  Alkaline Phos 39 - 117 IU/L 125(H) - 126(H)  AST 0 - 40 IU/L 33 - 37  ALT 0 - 44 IU/L 32 - 32     Lipid Panel     Component Value Date/Time   CHOL 221 (H) 10/07/2016 0856   TRIG 182 (H) 10/07/2016 0856   HDL 36 (L) 10/07/2016 0856   CHOLHDL 6.1 (H) 10/07/2016 0856   CHOLHDL 6.5 (H) 07/21/2015 1625   VLDL 61 (H) 07/21/2015 1625   LDLCALC 149 (H) 10/07/2016 0856    Lab Results  Component Value Date   HGBA1C 6.9 04/18/2017      Assessment & Plan:   1. Controlled type 2 diabetes mellitus without complication, without long-term current use of insulin (HCC) Controlled with A1c of 6.9 which has trended up from 6.5 previously Increase metformin dose Referred to ophthalmology at last visit Counseled on Diabetic diet, my plate method, 737 minutes  of moderate intensity exercise/week Keep blood sugar logs with fasting goals of 80-120 mg/dl, random of less than 180 and in the event of sugars less than 60 mg/dl or greater than 400 mg/dl please notify the clinic ASAP. It is recommended that you undergo annual eye exams and annual foot exams Pneumonia vaccine is recommended. - POCT glucose (manual entry) - POCT glycosylated hemoglobin (Hb A1C) - metFORMIN (GLUCOPHAGE XR) 500 MG 24 hr tablet; Take 1 tablet (500 mg total) by mouth 2 (two) times daily.  Dispense: 60 tablet; Refill: 5 - aspirin EC 81 MG tablet; Take 1 tablet (81 mg total) by mouth daily.  Dispense: 30 tablet; Refill: 5 - CMP14+EGFR - Lipid panel - Microalbumin/Creatinine Ratio, Urine  2. COPD, mild ([place]) Uncontrolled due to running out  of inhalers I will place him on a short course of prednisone-he has been advised of transient hyperglycemia - tiotropium (SPIRIVA HANDIHALER) 18 MCG inhalation capsule; Place 1 capsule (18 mcg total) into inhaler and inhale daily.  Dispense: 30 capsule; Refill: 5 - albuterol (PROVENTIL HFA;VENTOLIN HFA) 108 (90 Base) MCG/ACT inhaler; Inhale 2 puffs into the lungs every 6 (six) hours as needed for wheezing or shortness of breath.  Dispense: 1 Inhaler; Refill: 5  3. Hyperlipidemia associated with type 2 diabetes mellitus (HCC) Uncontrolled Lipid panel today Low-cholesterol diet - atorvastatin (LIPITOR) 80 MG tablet; Take 1 tablet (80 mg total) by mouth daily.  Dispense: 30 tablet; Refill: 5  4. Other constipation Placed on MiraLAX Advised to increase fiber intake   Meds ordered this encounter  Medications  . tiotropium (SPIRIVA HANDIHALER) 18 MCG inhalation capsule    Sig: Place 1 capsule (18 mcg total) into inhaler and inhale daily.    Dispense:  30 capsule    Refill:  5  . metFORMIN (GLUCOPHAGE XR) 500 MG 24 hr tablet    Sig: Take 1 tablet (500 mg total) by mouth 2 (two) times daily.    Dispense:  60 tablet    Refill:  5     Discontinue previous dose  . atorvastatin (LIPITOR) 80 MG tablet    Sig: Take 1 tablet (80 mg total) by mouth daily.    Dispense:  30 tablet    Refill:  5  . albuterol (PROVENTIL HFA;VENTOLIN HFA) 108 (90 Base) MCG/ACT inhaler    Sig: Inhale 2 puffs into the lungs every 6 (six) hours as needed for wheezing or shortness of breath.    Dispense:  1 Inhaler    Refill:  5  . aspirin EC 81 MG tablet    Sig: Take 1 tablet (81 mg total) by mouth daily.    Dispense:  30 tablet    Refill:  5  . polyethylene glycol powder (GLYCOLAX/MIRALAX) powder    Sig: Take 17 g by mouth once for 1 dose.    Dispense:  3350 g    Refill:  1  . predniSONE (DELTASONE) 20 MG tablet    Sig: Take 1 tablet (20 mg total) by mouth daily with breakfast.    Dispense:  5 tablet    Refill:  0    Follow-up: Return in about 3 months (around 07/17/2017) for follow up of Diabetes.   Charlott Rakes MD

## 2017-04-19 LAB — CMP14+EGFR
ALBUMIN: 4.1 g/dL (ref 3.6–4.8)
ALT: 26 IU/L (ref 0–44)
AST: 24 IU/L (ref 0–40)
Albumin/Globulin Ratio: 1.2 (ref 1.2–2.2)
Alkaline Phosphatase: 141 IU/L — ABNORMAL HIGH (ref 39–117)
BILIRUBIN TOTAL: 0.4 mg/dL (ref 0.0–1.2)
BUN / CREAT RATIO: 11 (ref 10–24)
BUN: 9 mg/dL (ref 8–27)
CALCIUM: 9.2 mg/dL (ref 8.6–10.2)
CHLORIDE: 102 mmol/L (ref 96–106)
CO2: 24 mmol/L (ref 20–29)
CREATININE: 0.81 mg/dL (ref 0.76–1.27)
GFR, EST AFRICAN AMERICAN: 106 mL/min/{1.73_m2} (ref >59)
GFR, EST NON AFRICAN AMERICAN: 91 mL/min/{1.73_m2} (ref >59)
GLUCOSE: 98 mg/dL (ref 65–99)
Globulin, Total: 3.5 g/dL (ref 1.5–4.5)
Potassium: 4.5 mmol/L (ref 3.5–5.2)
Sodium: 142 mmol/L (ref 134–144)
TOTAL PROTEIN: 7.6 g/dL (ref 6.0–8.5)

## 2017-04-19 LAB — LIPID PANEL
CHOL/HDL RATIO: 4.9 ratio (ref 0.0–5.0)
Cholesterol, Total: 167 mg/dL (ref 100–199)
HDL: 34 mg/dL — AB (ref >39)
LDL CALC: 79 mg/dL (ref 0–99)
Triglycerides: 269 mg/dL — ABNORMAL HIGH (ref 0–149)
VLDL CHOLESTEROL CAL: 54 mg/dL — AB (ref 5–40)

## 2017-04-19 LAB — MICROALBUMIN / CREATININE URINE RATIO
CREATININE, UR: 157.2 mg/dL
MICROALBUM., U, RANDOM: 3.5 ug/mL
Microalb/Creat Ratio: 2.2 mg/g creat (ref 0.0–30.0)

## 2017-05-02 ENCOUNTER — Ambulatory Visit: Payer: Medicaid Other | Attending: Family Medicine | Admitting: Family Medicine

## 2017-05-02 ENCOUNTER — Encounter: Payer: Self-pay | Admitting: Family Medicine

## 2017-05-02 VITALS — BP 123/79 | HR 83 | Temp 97.5°F | Ht 65.0 in | Wt 180.0 lb

## 2017-05-02 DIAGNOSIS — E1169 Type 2 diabetes mellitus with other specified complication: Secondary | ICD-10-CM | POA: Diagnosis not present

## 2017-05-02 DIAGNOSIS — Z7982 Long term (current) use of aspirin: Secondary | ICD-10-CM | POA: Insufficient documentation

## 2017-05-02 DIAGNOSIS — E785 Hyperlipidemia, unspecified: Secondary | ICD-10-CM | POA: Diagnosis not present

## 2017-05-02 DIAGNOSIS — Z029 Encounter for administrative examinations, unspecified: Secondary | ICD-10-CM | POA: Diagnosis not present

## 2017-05-02 DIAGNOSIS — E119 Type 2 diabetes mellitus without complications: Secondary | ICD-10-CM | POA: Insufficient documentation

## 2017-05-02 DIAGNOSIS — J449 Chronic obstructive pulmonary disease, unspecified: Secondary | ICD-10-CM | POA: Insufficient documentation

## 2017-05-02 DIAGNOSIS — Z79899 Other long term (current) drug therapy: Secondary | ICD-10-CM | POA: Diagnosis not present

## 2017-05-02 DIAGNOSIS — K219 Gastro-esophageal reflux disease without esophagitis: Secondary | ICD-10-CM | POA: Diagnosis not present

## 2017-05-02 DIAGNOSIS — Z7984 Long term (current) use of oral hypoglycemic drugs: Secondary | ICD-10-CM | POA: Insufficient documentation

## 2017-05-02 LAB — GLUCOSE, POCT (MANUAL RESULT ENTRY): POC Glucose: 107 mg/dl — AB (ref 70–99)

## 2017-05-02 NOTE — Progress Notes (Signed)
Subjective:  Patient ID: [name], male    DOB: 05-18-1950  Age: 67 y.o. MRN: [id number]  CC: Diabetes   HPI Avrey Hyser is a 67 year old male with a history of COPD, type 2 diabetes mellitus (A1c 6.9), hyperlipidemia who presents for this follow-up visit and is being seen with the aid of a Nepali video interpreter.who presents today requesting completion of immigration paperwork to exempt him from taking the citizenship exam and he cites lack of ability to speak English as his reason hence he is unable to study the material.  With regards to his Diabetes, this is controlled and he denies COPD exacerbation. He has no acute concerns today.  Past Medical History:  Diagnosis Date  . Bronchitis   . COPD (chronic obstructive pulmonary disease) ([place])   . GERD (gastroesophageal reflux disease)     Past Surgical History:  Procedure Laterality Date  . CYST REMOVAL NECK  2013   benign    No Known Allergies   Outpatient Medications Prior to Visit  Medication Sig Dispense Refill  . albuterol (PROVENTIL HFA;VENTOLIN HFA) 108 (90 Base) MCG/ACT inhaler Inhale 2 puffs into the lungs every 6 (six) hours as needed for wheezing or shortness of breath. 1 Inhaler 5  . aspirin EC 81 MG tablet Take 1 tablet (81 mg total) by mouth daily. 30 tablet 5  . atorvastatin (LIPITOR) 80 MG tablet Take 1 tablet (80 mg total) by mouth daily. 30 tablet 5  . metFORMIN (GLUCOPHAGE XR) 500 MG 24 hr tablet Take 1 tablet (500 mg total) by mouth 2 (two) times daily. 60 tablet 5  . predniSONE (DELTASONE) 20 MG tablet Take 1 tablet (20 mg total) by mouth daily with breakfast. 5 tablet 0  . tiotropium (SPIRIVA HANDIHALER) 18 MCG inhalation capsule Place 1 capsule (18 mcg total) into inhaler and inhale daily. 30 capsule 5  . cetirizine (ZYRTEC) 10 MG tablet Take 1 tablet (10 mg total) by mouth daily. (Patient not taking: Reported on 04/18/2017) 30 tablet 11  . ketotifen (ZADITOR) 0.025 % ophthalmic  solution Place 1 drop into both eyes 2 (two) times daily. (Patient not taking: Reported on 04/18/2017) 5 mL 1   No facility-administered medications prior to visit.     ROS Review of Systems  Constitutional: Negative for activity change and appetite change.  HENT: Negative for sinus pressure and sore throat.   Eyes: Negative for visual disturbance.  Respiratory: Negative for cough, chest tightness and shortness of breath.   Cardiovascular: Negative for chest pain and leg swelling.  Gastrointestinal: Negative for abdominal distention, abdominal pain, constipation and diarrhea.  Endocrine: Negative.   Genitourinary: Negative for dysuria.  Musculoskeletal: Negative for joint swelling and myalgias.  Skin: Negative for rash.  Allergic/Immunologic: Negative.   Neurological: Negative for weakness, light-headedness and numbness.  Psychiatric/Behavioral: Negative for dysphoric mood and suicidal ideas.    Objective:  BP 123/79   Pulse 83   Temp (!) 97.5 F (36.4 C) (Oral)   Ht 5\' 5"  (1.651 m)   Wt 180 lb (81.6 kg)   SpO2 96%   BMI 29.95 kg/m   BP/Weight 05/02/2017 04/18/2017 50/53/9767  Systolic BP 341 937 902  Diastolic BP 79 70 83  Wt. (Lbs) 180 179.8 181  BMI 29.95 29.92 30.12      Physical Exam  Constitutional: He is oriented to person, place, and time. He appears well-developed and well-nourished.  Cardiovascular: Normal rate, normal heart sounds and intact distal pulses.  No murmur heard.  Pulmonary/Chest: Effort normal and breath sounds normal. He has no wheezes. He has no rales. He exhibits no tenderness.  Abdominal: Soft. Bowel sounds are normal. He exhibits no distension and no mass. There is no tenderness.  Musculoskeletal: Normal range of motion.  Neurological: He is alert and oriented to person, place, and time.  Psychiatric: He has a normal mood and affect.    CMP Latest Ref Rng & Units 04/18/2017 10/07/2016 07/21/2015  Glucose 65 - 99 mg/dL 98 98 111(H)  BUN 8 - 27  mg/dL 9 7(L) 11  Creatinine 0.76 - 1.27 mg/dL 0.81 0.86 0.79  Sodium 134 - 144 mmol/L 142 142 140  Potassium 3.5 - 5.2 mmol/L 4.5 4.3 4.2  Chloride 96 - 106 mmol/L 102 102 102  CO2 20 - 29 mmol/L 24 22 27   Calcium 8.6 - 10.2 mg/dL 9.2 9.5 9.4  Total Protein 6.0 - 8.5 g/dL 7.6 7.7 -  Total Bilirubin 0.0 - 1.2 mg/dL 0.4 0.6 -  Alkaline Phos 39 - 117 IU/L 141(H) 125(H) -  AST 0 - 40 IU/L 24 33 -  ALT 0 - 44 IU/L 26 32 -    Lipid Panel     Component Value Date/Time   CHOL 167 04/18/2017 0931   TRIG 269 (H) 04/18/2017 0931   HDL 34 (L) 04/18/2017 0931   CHOLHDL 4.9 04/18/2017 0931   CHOLHDL 6.5 (H) 07/21/2015 1625   VLDL 61 (H) 07/21/2015 1625   LDLCALC 79 04/18/2017 0931    Lab Results  Component Value Date   HGBA1C 6.9 04/18/2017    Assessment & Plan:   1. Controlled type 2 diabetes mellitus without complication, without long-term current use of insulin (HCC) Controlled with A1c of 6.9 Continue current management - POCT glucose (manual entry)  2. COPD, mild (HCC) Stable No acute flares  3. Hyperlipidemia associated with type 2 diabetes mellitus (HCC) Stable  4. Administrative encounter Immigration paperwork completed     No orders of the defined types were placed in this encounter.   Follow-up: Return for Follow up of chronic medical conditions, keep previously scheduled appointment.   Charlott Rakes MD

## 2017-05-04 NOTE — Progress Notes (Signed)
Patient was informed lab results on OV.

## 2017-05-23 ENCOUNTER — Other Ambulatory Visit: Payer: Self-pay | Admitting: Family Medicine

## 2017-07-18 ENCOUNTER — Encounter: Payer: Self-pay | Admitting: Family Medicine

## 2017-07-18 ENCOUNTER — Ambulatory Visit: Payer: Medicaid Other | Admitting: Family Medicine

## 2017-07-18 ENCOUNTER — Ambulatory Visit: Payer: Medicaid Other | Attending: Family Medicine | Admitting: Family Medicine

## 2017-07-18 VITALS — BP 107/71 | HR 86 | Temp 97.6°F | Ht 65.0 in | Wt 178.4 lb

## 2017-07-18 DIAGNOSIS — K219 Gastro-esophageal reflux disease without esophagitis: Secondary | ICD-10-CM | POA: Diagnosis not present

## 2017-07-18 DIAGNOSIS — E785 Hyperlipidemia, unspecified: Secondary | ICD-10-CM | POA: Diagnosis not present

## 2017-07-18 DIAGNOSIS — E1169 Type 2 diabetes mellitus with other specified complication: Secondary | ICD-10-CM | POA: Diagnosis not present

## 2017-07-18 DIAGNOSIS — R21 Rash and other nonspecific skin eruption: Secondary | ICD-10-CM | POA: Diagnosis not present

## 2017-07-18 DIAGNOSIS — Z7982 Long term (current) use of aspirin: Secondary | ICD-10-CM | POA: Insufficient documentation

## 2017-07-18 DIAGNOSIS — Z794 Long term (current) use of insulin: Secondary | ICD-10-CM | POA: Diagnosis not present

## 2017-07-18 DIAGNOSIS — Z79899 Other long term (current) drug therapy: Secondary | ICD-10-CM | POA: Insufficient documentation

## 2017-07-18 DIAGNOSIS — H5712 Ocular pain, left eye: Secondary | ICD-10-CM | POA: Insufficient documentation

## 2017-07-18 DIAGNOSIS — J449 Chronic obstructive pulmonary disease, unspecified: Secondary | ICD-10-CM | POA: Diagnosis not present

## 2017-07-18 LAB — POCT GLYCOSYLATED HEMOGLOBIN (HGB A1C): Hemoglobin A1C: 6.5

## 2017-07-18 LAB — GLUCOSE, POCT (MANUAL RESULT ENTRY): POC Glucose: 108 mg/dl — AB (ref 70–99)

## 2017-07-18 MED ORDER — HYDROCORTISONE 0.5 % EX CREA: 1.0000 "application " | TOPICAL_CREAM | Freq: Two times a day (BID) | CUTANEOUS | 0 refills | Status: DC

## 2017-07-18 MED ORDER — OLOPATADINE HCL 0.2 % OP SOLN: OPHTHALMIC | 1 refills | Status: DC

## 2017-07-18 MED ORDER — OLOPATADINE HCL 0.1 % OP SOLN: 1.0000 [drp] | Freq: Two times a day (BID) | OPHTHALMIC | 1 refills | Status: DC

## 2017-07-18 MED FILL — PATADAY 0.2% EYE DROPS: 0.2 | 30 days supply | Qty: 3 | Fill #0

## 2017-07-18 NOTE — Progress Notes (Signed)
Subjective:  Patient ID: David Barber, male    DOB: 1950/05/14  Age: 67 y.o. MRN: [id number]  CC: Diabetes   HPI David Barber is a 67 year old male with a history of COPD, type 2 diabetes mellitus (A1c 6.5), hyperlipidemia COPD who presents today for follow-up visit. He complains of a 10-day history of left eye pain and some blurry vision but denies itching in his eyes or foreign body sensation.  He denies sinus symptoms or upper respiratory symptoms. He has also had itching in his lower abdomen for the last 5 days with no itching in other parts of his body.  Endorses developing a rash as well.  With regards to his COPD he denies recent exacerbation and has been compliant with Spiriva and his bronchodilator and is not having to use his MDI more often than not. Diabetes has been controlled on his current regimen and he denies hypoglycemia, numbness in extremities and is not up-to-date on annual eye exam. He tolerates his statin without complaints of myalgias or other adverse effects.   Past Medical History:  Diagnosis Date  . Bronchitis   . COPD (chronic obstructive pulmonary disease) ([place])   . GERD (gastroesophageal reflux disease)     Past Surgical History:  Procedure Laterality Date  . CYST REMOVAL NECK  2013   benign    No Known Allergies   Outpatient Medications Prior to Visit  Medication Sig Dispense Refill  . albuterol (PROVENTIL HFA;VENTOLIN HFA) 108 (90 Base) MCG/ACT inhaler Inhale 2 puffs into the lungs every 6 (six) hours as needed for wheezing or shortness of breath. 1 Inhaler 5  . aspirin EC 81 MG tablet Take 1 tablet (81 mg total) by mouth daily. 30 tablet 5  . atorvastatin (LIPITOR) 80 MG tablet Take 1 tablet (80 mg total) by mouth daily. 30 tablet 5  . metFORMIN (GLUCOPHAGE XR) 500 MG 24 hr tablet Take 1 tablet (500 mg total) by mouth 2 (two) times daily. 60 tablet 5  . tiotropium (SPIRIVA HANDIHALER) 18 MCG inhalation capsule Place 1 capsule (18 mcg  total) into inhaler and inhale daily. 30 capsule 5  . cetirizine (ZYRTEC) 10 MG tablet Take 1 tablet (10 mg total) by mouth daily. (Patient not taking: Reported on 04/18/2017) 30 tablet 11  . ketotifen (ZADITOR) 0.025 % ophthalmic solution Place 1 drop into both eyes 2 (two) times daily. (Patient not taking: Reported on 04/18/2017) 5 mL 1  . predniSONE (DELTASONE) 20 MG tablet Take 1 tablet (20 mg total) by mouth daily with breakfast. (Patient not taking: Reported on 07/18/2017) 5 tablet 0   No facility-administered medications prior to visit.     ROS Review of Systems  Constitutional: Negative for activity change and appetite change.  HENT: Negative for sinus pressure and sore throat.   Eyes: Positive for pain. Negative for visual disturbance.  Respiratory: Negative for cough, chest tightness and shortness of breath.   Cardiovascular: Negative for chest pain and leg swelling.  Gastrointestinal: Negative for abdominal distention, abdominal pain, constipation and diarrhea.  Endocrine: Negative.   Genitourinary: Negative for dysuria.  Musculoskeletal: Negative for joint swelling and myalgias.  Skin: Positive for rash.  Allergic/Immunologic: Negative.   Neurological: Negative for weakness, light-headedness and numbness.  Psychiatric/Behavioral: Negative for dysphoric mood and suicidal ideas.    Objective:  BP 107/71   Pulse 86   Temp 97.6 F (36.4 C) (Oral)   Ht 5\' 5"  (1.651 m)   Wt 178 lb 6.4 oz (80.9 kg)  SpO2 94%   BMI 29.69 kg/m   BP/Weight 07/18/2017 05/02/2017 8/92/1194  Systolic BP 174 081 448  Diastolic BP 71 79 70  Wt. (Lbs) 178.4 180 179.8  BMI 29.69 29.95 29.92     Physical Exam  Constitutional: He is oriented to person, place, and time. He appears well-developed and well-nourished.  Eyes: Conjunctivae are normal. Right eye exhibits no discharge. Left eye exhibits no discharge.  Cardiovascular: Normal rate, normal heart sounds and intact distal pulses.  No murmur  heard. Pulmonary/Chest: Effort normal and breath sounds normal. He has no wheezes. He has no rales. He exhibits no tenderness.  Abdominal: Soft. Bowel sounds are normal. He exhibits no distension and no mass. There is no tenderness.  Musculoskeletal: Normal range of motion.  Neurological: He is alert and oriented to person, place, and time.  Skin:  Erythematous pinpoint rash on lower abdomen with scratch marks  Psychiatric: He has a normal mood and affect.     CMP Latest Ref Rng & Units 04/18/2017 10/07/2016 07/21/2015  Glucose 65 - 99 mg/dL 98 98 111(H)  BUN 8 - 27 mg/dL 9 7(L) 11  Creatinine 0.76 - 1.27 mg/dL 0.81 0.86 0.79  Sodium 134 - 144 mmol/L 142 142 140  Potassium 3.5 - 5.2 mmol/L 4.5 4.3 4.2  Chloride 96 - 106 mmol/L 102 102 102  CO2 20 - 29 mmol/L 24 22 27   Calcium 8.6 - 10.2 mg/dL 9.2 9.5 9.4  Total Protein 6.0 - 8.5 g/dL 7.6 7.7 -  Total Bilirubin 0.0 - 1.2 mg/dL 0.4 0.6 -  Alkaline Phos 39 - 117 IU/L 141(H) 125(H) -  AST 0 - 40 IU/L 24 33 -  ALT 0 - 44 IU/L 26 32 -    Lipid Panel     Component Value Date/Time   CHOL 167 04/18/2017 0931   TRIG 269 (H) 04/18/2017 0931   HDL 34 (L) 04/18/2017 0931   CHOLHDL 4.9 04/18/2017 0931   CHOLHDL 6.5 (H) 07/21/2015 1625   VLDL 61 (H) 07/21/2015 1625   LDLCALC 79 04/18/2017 0931     Lab Results  Component Value Date   HGBA1C 6.5 07/18/2017     Assessment & Plan:   1. Controlled type 2 diabetes mellitus with other specified complication, with long-term current use of insulin (HCC) Controlled with A1c of 6.5 Continue metformin Counseled on Diabetic diet, my plate method, 185 minutes of moderate intensity exercise/week Keep blood sugar logs with fasting goals of 80-120 mg/dl, random of less than 180 and in the event of sugars less than 60 mg/dl or greater than 400 mg/dl please notify the clinic ASAP. It is recommended that you undergo annual eye exams and annual foot exams. Pneumonia vaccine is recommended. - POCT  glucose (manual entry) - POCT glycosylated hemoglobin (Hb A1C) - Ambulatory referral to Ophthalmology  2. Hyperlipidemia associated with type 2 diabetes mellitus (HCC) Controlled Continue atorvastatin  3. COPD, mild (HCC) Stable Continue Proventil MDI and Spiriva  4. Rash - hydrocortisone cream 0.5 %; Apply 1 application topically 2 (two) times daily.  Dispense: 66 g; Refill: 0  5. Pain of left eye - olopatadine (PATANOL) 0.1 % ophthalmic solution; Place 1 drop into the left eye 2 (two) times daily.  Dispense: 5 mL; Refill: 1   Meds ordered this encounter  Medications  . olopatadine (PATANOL) 0.1 % ophthalmic solution    Sig: Place 1 drop into the left eye 2 (two) times daily.    Dispense:  5 mL  Refill:  1  . hydrocortisone cream 0.5 %    Sig: Apply 1 application topically 2 (two) times daily.    Dispense:  66 g    Refill:  0    Follow-up: Return in about 3 months (around 10/17/2017) for follow up of chronic medical conditions.   Charlott Rakes MD

## 2017-07-18 NOTE — Patient Instructions (Signed)
Diabetes Mellitus and Nutrition When you have diabetes (diabetes mellitus), it is very important to have healthy eating habits because your blood sugar (glucose) levels are greatly affected by what you eat and drink. Eating healthy foods in the appropriate amounts, at about the same times every day, can help you:  Control your blood glucose.  Lower your risk of heart disease.  Improve your blood pressure.  Reach or maintain a healthy weight.  Every person with diabetes is different, and each person has different needs for a meal plan. Your health care provider may recommend that you work with a diet and nutrition specialist (dietitian) to make a meal plan that is best for you. Your meal plan may vary depending on factors such as:  The calories you need.  The medicines you take.  Your weight.  Your blood glucose, blood pressure, and cholesterol levels.  Your activity level.  Other health conditions you have, such as heart or kidney disease.  How do carbohydrates affect me? Carbohydrates affect your blood glucose level more than any other type of food. Eating carbohydrates naturally increases the amount of glucose in your blood. Carbohydrate counting is a method for keeping track of how many carbohydrates you eat. Counting carbohydrates is important to keep your blood glucose at a healthy level, especially if you use insulin or take certain oral diabetes medicines. It is important to know how many carbohydrates you can safely have in each meal. This is different for every person. Your dietitian can help you calculate how many carbohydrates you should have at each meal and for snack. Foods that contain carbohydrates include:  Bread, cereal, rice, pasta, and crackers.  Potatoes and corn.  Peas, beans, and lentils.  Milk and yogurt.  Fruit and juice.  Desserts, such as cakes, cookies, ice cream, and candy.  How does alcohol affect me? Alcohol can cause a sudden decrease in blood  glucose (hypoglycemia), especially if you use insulin or take certain oral diabetes medicines. Hypoglycemia can be a life-threatening condition. Symptoms of hypoglycemia (sleepiness, dizziness, and confusion) are similar to symptoms of having too much alcohol. If your health care provider says that alcohol is safe for you, follow these guidelines:  Limit alcohol intake to no more than 1 drink per day for nonpregnant women and 2 drinks per day for men. One drink equals 12 oz of beer, 5 oz of wine, or 1 oz of hard liquor.  Do not drink on an empty stomach.  Keep yourself hydrated with water, diet soda, or unsweetened iced tea.  Keep in mind that regular soda, juice, and other mixers may contain a lot of sugar and must be counted as carbohydrates.  What are tips for following this plan? Reading food labels  Start by checking the serving size on the label. The amount of calories, carbohydrates, fats, and other nutrients listed on the label are based on one serving of the food. Many foods contain more than one serving per package.  Check the total grams (g) of carbohydrates in one serving. You can calculate the number of servings of carbohydrates in one serving by dividing the total carbohydrates by 15. For example, if a food has 30 g of total carbohydrates, it would be equal to 2 servings of carbohydrates.  Check the number of grams (g) of saturated and trans fats in one serving. Choose foods that have low or no amount of these fats.  Check the number of milligrams (mg) of sodium in one serving. Most people   should limit total sodium intake to less than 2,300 mg per day.  Always check the nutrition information of foods labeled as "low-fat" or "nonfat". These foods may be higher in added sugar or refined carbohydrates and should be avoided.  Talk to your dietitian to identify your daily goals for nutrients listed on the label. Shopping  Avoid buying canned, premade, or processed foods. These  foods tend to be high in fat, sodium, and added sugar.  Shop around the outside edge of the grocery store. This includes fresh fruits and vegetables, bulk grains, fresh meats, and fresh dairy. Cooking  Use low-heat cooking methods, such as baking, instead of high-heat cooking methods like deep frying.  Cook using healthy oils, such as olive, canola, or sunflower oil.  Avoid cooking with butter, cream, or high-fat meats. Meal planning  Eat meals and snacks regularly, preferably at the same times every day. Avoid going long periods of time without eating.  Eat foods high in fiber, such as fresh fruits, vegetables, beans, and whole grains. Talk to your dietitian about how many servings of carbohydrates you can eat at each meal.  Eat 4-6 ounces of lean protein each day, such as lean meat, chicken, fish, eggs, or tofu. 1 ounce is equal to 1 ounce of meat, chicken, or fish, 1 egg, or 1/4 cup of tofu.  Eat some foods each day that contain healthy fats, such as avocado, nuts, seeds, and fish. Lifestyle   Check your blood glucose regularly.  Exercise at least 30 minutes 5 or more days each week, or as told by your health care provider.  Take medicines as told by your health care provider.  Do not use any products that contain nicotine or tobacco, such as cigarettes and e-cigarettes. If you need help quitting, ask your health care provider.  Work with a counselor or diabetes educator to identify strategies to manage stress and any emotional and social challenges. What are some questions to ask my health care provider?  Do I need to meet with a diabetes educator?  Do I need to meet with a dietitian?  What number can I call if I have questions?  When are the best times to check my blood glucose? Where to find more information:  American Diabetes Association: diabetes.org/food-and-fitness/food  Academy of Nutrition and Dietetics:  www.eatright.org/resources/health/diseases-and-conditions/diabetes  National Institute of Diabetes and Digestive and Kidney Diseases (NIH): www.niddk.nih.gov/health-information/diabetes/overview/diet-eating-physical-activity Summary  A healthy meal plan will help you control your blood glucose and maintain a healthy lifestyle.  Working with a diet and nutrition specialist (dietitian) can help you make a meal plan that is best for you.  Keep in mind that carbohydrates and alcohol have immediate effects on your blood glucose levels. It is important to count carbohydrates and to use alcohol carefully. This information is not intended to replace advice given to you by your health care provider. Make sure you discuss any questions you have with your health care provider. Document Released: 12/02/2004 Document Revised: 04/11/2016 Document Reviewed: 04/11/2016 Elsevier Interactive Patient Education  2018 [employer]  

## 2017-10-17 ENCOUNTER — Ambulatory Visit: Payer: Medicaid Other | Attending: Family Medicine | Admitting: Family Medicine

## 2017-10-17 ENCOUNTER — Encounter: Payer: Self-pay | Admitting: Family Medicine

## 2017-10-17 VITALS — BP 122/77 | HR 91 | Temp 97.6°F | Ht 65.0 in | Wt 179.0 lb

## 2017-10-17 DIAGNOSIS — Z7982 Long term (current) use of aspirin: Secondary | ICD-10-CM | POA: Insufficient documentation

## 2017-10-17 DIAGNOSIS — E119 Type 2 diabetes mellitus without complications: Secondary | ICD-10-CM | POA: Diagnosis present

## 2017-10-17 DIAGNOSIS — F17220 Nicotine dependence, chewing tobacco, uncomplicated: Secondary | ICD-10-CM | POA: Insufficient documentation

## 2017-10-17 DIAGNOSIS — Z7984 Long term (current) use of oral hypoglycemic drugs: Secondary | ICD-10-CM | POA: Insufficient documentation

## 2017-10-17 DIAGNOSIS — R5383 Other fatigue: Secondary | ICD-10-CM

## 2017-10-17 DIAGNOSIS — M62838 Other muscle spasm: Secondary | ICD-10-CM | POA: Diagnosis not present

## 2017-10-17 DIAGNOSIS — E785 Hyperlipidemia, unspecified: Secondary | ICD-10-CM

## 2017-10-17 DIAGNOSIS — E1169 Type 2 diabetes mellitus with other specified complication: Secondary | ICD-10-CM | POA: Diagnosis not present

## 2017-10-17 DIAGNOSIS — K219 Gastro-esophageal reflux disease without esophagitis: Secondary | ICD-10-CM | POA: Diagnosis not present

## 2017-10-17 DIAGNOSIS — Z720 Tobacco use: Secondary | ICD-10-CM

## 2017-10-17 DIAGNOSIS — J449 Chronic obstructive pulmonary disease, unspecified: Secondary | ICD-10-CM

## 2017-10-17 DIAGNOSIS — Z7951 Long term (current) use of inhaled steroids: Secondary | ICD-10-CM | POA: Diagnosis not present

## 2017-10-17 DIAGNOSIS — Z79899 Other long term (current) drug therapy: Secondary | ICD-10-CM | POA: Diagnosis not present

## 2017-10-17 LAB — GLUCOSE, POCT (MANUAL RESULT ENTRY): POC Glucose: 164 mg/dl — AB (ref 70–99)

## 2017-10-17 LAB — POCT GLYCOSYLATED HEMOGLOBIN (HGB A1C): HBA1C, POC (CONTROLLED DIABETIC RANGE): 6.6 % (ref 0.0–7.0)

## 2017-10-17 MED ORDER — TIOTROPIUM BROMIDE MONOHYDRATE 18 MCG IN CAPS: 18.0000 ug | ORAL_CAPSULE | Freq: Every day | RESPIRATORY_TRACT | 6 refills | Status: DC

## 2017-10-17 MED ORDER — METHOCARBAMOL 500 MG PO TABS: 500.0000 mg | ORAL_TABLET | Freq: Two times a day (BID) | ORAL | 1 refills | Status: DC | PRN

## 2017-10-17 MED ORDER — ATORVASTATIN CALCIUM 80 MG PO TABS: 80.0000 mg | ORAL_TABLET | Freq: Every day | ORAL | 6 refills | Status: DC

## 2017-10-17 MED ORDER — FLUTICASONE-SALMETEROL 250-50 MCG/DOSE IN AEPB: 1.0000 | INHALATION_SPRAY | Freq: Two times a day (BID) | RESPIRATORY_TRACT | 3 refills | Status: DC

## 2017-10-17 MED ORDER — METFORMIN HCL ER 500 MG PO TB24
500.0000 mg | ORAL_TABLET | Freq: Two times a day (BID) | ORAL | 6 refills | Status: DC
Start: 2017-10-17 — End: 2018-01-18

## 2017-10-17 MED ORDER — ALBUTEROL SULFATE HFA 108 (90 BASE) MCG/ACT IN AERS: 2.0000 | INHALATION_SPRAY | Freq: Four times a day (QID) | RESPIRATORY_TRACT | 6 refills | Status: DC | PRN

## 2017-10-17 MED FILL — METHOCARBAMOL 500 MG TABS: 500 | 30 days supply | Qty: 60 | Fill #0

## 2017-10-17 MED FILL — PROAIR HFA 90 MCG INHALER: 108 (90 BAS | 30 days supply | Qty: 9 | Fill #0

## 2017-10-17 MED FILL — metFORMIN HCL ER 500 MG TB2: 500 | 30 days supply | Qty: 60 | Fill #0

## 2017-10-17 MED FILL — SPIRIVA 18 MCG CP-HANDIHALE: 18 | 30 days supply | Qty: 30 | Fill #0

## 2017-10-17 MED FILL — ADVAIR 250/50 DISKUS: 250-50 | 30 days supply | Qty: 60 | Fill #0

## 2017-10-17 NOTE — Progress Notes (Signed)
Subjective:  Patient ID: David Barber, male    DOB: 07-Oct-1950  Age: 67 y.o. MRN: [id number]  CC: Diabetes   HPI David Barber  is a 67 year old male with a history of COPD, type 2 diabetes mellitus (A1c 6.6), hyperlipidemia COPD who presents today for follow-up visit seen with the aid of a Nepali video interpreter. He complains of "feeling tired in his legs" which has been present for the last couple of months and is worse when he walks but denies pain in his legs and has no generalized fatigue.  Denies joint pains or joint swelling. His COPD has not been controlled as he complains of increased shortness of breath with some cough but no wheezing and of note continues to chew tobacco.  He is on Spiriva and Proventil for his COPD. His diabetes mellitus has been controlled with no complaints of hypoglycemia, numbness in extremities, visual concerns.  He exercises regularly by means of walking and is compliant with a diabetic diet.  Past Medical History:  Diagnosis Date  . Bronchitis   . COPD (chronic obstructive pulmonary disease) ([place])   . GERD (gastroesophageal reflux disease)     Past Surgical History:  Procedure Laterality Date  . CYST REMOVAL NECK  2013   benign    No Known Allergies   Outpatient Medications Prior to Visit  Medication Sig Dispense Refill  . aspirin EC 81 MG tablet Take 1 tablet (81 mg total) by mouth daily. 30 tablet 5  . hydrocortisone cream 0.5 % Apply 1 application topically 2 (two) times daily. 66 g 0  . Olopatadine HCl (PATADAY) 0.2 % SOLN Place 1 drop into left eye once daily 5 mL 1  . albuterol (PROVENTIL HFA;VENTOLIN HFA) 108 (90 Base) MCG/ACT inhaler Inhale 2 puffs into the lungs every 6 (six) hours as needed for wheezing or shortness of breath. 1 Inhaler 5  . atorvastatin (LIPITOR) 80 MG tablet Take 1 tablet (80 mg total) by mouth daily. 30 tablet 5  . metFORMIN (GLUCOPHAGE XR) 500 MG 24 hr tablet Take 1 tablet (500 mg total) by mouth 2  (two) times daily. 60 tablet 5  . tiotropium (SPIRIVA HANDIHALER) 18 MCG inhalation capsule Place 1 capsule (18 mcg total) into inhaler and inhale daily. 30 capsule 5  . cetirizine (ZYRTEC) 10 MG tablet Take 1 tablet (10 mg total) by mouth daily. (Patient not taking: Reported on 04/18/2017) 30 tablet 11  . ketotifen (ZADITOR) 0.025 % ophthalmic solution Place 1 drop into both eyes 2 (two) times daily. (Patient not taking: Reported on 04/18/2017) 5 mL 1  . predniSONE (DELTASONE) 20 MG tablet Take 1 tablet (20 mg total) by mouth daily with breakfast. (Patient not taking: Reported on 07/18/2017) 5 tablet 0   No facility-administered medications prior to visit.     ROS Review of Systems  Constitutional: Negative for activity change and appetite change.  HENT: Negative for sinus pressure and sore throat.   Eyes: Negative for visual disturbance.  Respiratory: Positive for cough and shortness of breath. Negative for chest tightness.   Cardiovascular: Negative for chest pain and leg swelling.  Gastrointestinal: Negative for abdominal distention, abdominal pain, constipation and diarrhea.  Endocrine: Negative.   Genitourinary: Negative for dysuria.  Musculoskeletal:       See hpi  Skin: Negative for rash.  Allergic/Immunologic: Negative.   Neurological: Negative for weakness, light-headedness and numbness.  Psychiatric/Behavioral: Negative for dysphoric mood and suicidal ideas.    Objective:  BP 122/77  Pulse 91   Temp 97.6 F (36.4 C) (Oral)   Ht 5\' 5"  (1.651 m)   Wt 179 lb (81.2 kg)   SpO2 93%   BMI 29.79 kg/m   BP/Weight 10/17/2017 07/18/2017 2/72/5366  Systolic BP 440 347 425  Diastolic BP 77 71 79  Wt. (Lbs) 179 178.4 180  BMI 29.79 29.69 29.95      Physical Exam  Constitutional: He is oriented to person, place, and time. He appears well-developed and well-nourished.  Cardiovascular: Normal rate, normal heart sounds and intact distal pulses.  No murmur  heard. Pulmonary/Chest: Effort normal and breath sounds normal. He has no wheezes. He has no rales. He exhibits no tenderness.  Abdominal: Soft. Bowel sounds are normal. He exhibits no distension and no mass. There is no tenderness.  Musculoskeletal: Normal range of motion.  Neurological: He is alert and oriented to person, place, and time.  Skin: Skin is warm and dry.  Psychiatric: He has a normal mood and affect.     CMP Latest Ref Rng & Units 04/18/2017 10/07/2016 07/21/2015  Glucose 65 - 99 mg/dL 98 98 111(H)  BUN 8 - 27 mg/dL 9 7(L) 11  Creatinine 0.76 - 1.27 mg/dL 0.81 0.86 0.79  Sodium 134 - 144 mmol/L 142 142 140  Potassium 3.5 - 5.2 mmol/L 4.5 4.3 4.2  Chloride 96 - 106 mmol/L 102 102 102  CO2 20 - 29 mmol/L 24 22 27   Calcium 8.6 - 10.2 mg/dL 9.2 9.5 9.4  Total Protein 6.0 - 8.5 g/dL 7.6 7.7 -  Total Bilirubin 0.0 - 1.2 mg/dL 0.4 0.6 -  Alkaline Phos 39 - 117 IU/L 141(H) 125(H) -  AST 0 - 40 IU/L 24 33 -  ALT 0 - 44 IU/L 26 32 -    Lipid Panel     Component Value Date/Time   CHOL 167 04/18/2017 0931   TRIG 269 (H) 04/18/2017 0931   HDL 34 (L) 04/18/2017 0931   CHOLHDL 4.9 04/18/2017 0931   CHOLHDL 6.5 (H) 07/21/2015 1625   VLDL 61 (H) 07/21/2015 1625   LDLCALC 79 04/18/2017 0931    Lab Results  Component Value Date   HGBA1C 6.6 10/17/2017    Assessment & Plan:   1. Controlled type 2 diabetes mellitus without complication, without long-term current use of insulin (HCC) Controlled Continue current regimen Counseled on Diabetic diet, my plate method, 956 minutes of moderate intensity exercise/week Keep blood sugar logs with fasting goals of 80-120 mg/dl, random of less than 180 and in the event of sugars less than 60 mg/dl or greater than 400 mg/dl please notify the clinic ASAP. It is recommended that you undergo annual eye exams and annual foot exams. Pneumonia vaccine is recommended. - POCT glucose (manual entry) - POCT glycosylated hemoglobin (Hb A1C) -  Ambulatory referral to Ophthalmology - metFORMIN (GLUCOPHAGE XR) 500 MG 24 hr tablet; Take 1 tablet (500 mg total) by mouth 2 (two) times daily.  Dispense: 60 tablet; Refill: 6  2. COPD, mild ([place]) Uncontrolled Advair added to regimen Advised to desist from chewing tobacco - Pulmonary function test; Future - tiotropium (SPIRIVA HANDIHALER) 18 MCG inhalation capsule; Place 1 capsule (18 mcg total) into inhaler and inhale daily.  Dispense: 30 capsule; Refill: 6 - albuterol (PROVENTIL HFA;VENTOLIN HFA) 108 (90 Base) MCG/ACT inhaler; Inhale 2 puffs into the lungs every 6 (six) hours as needed for wheezing or shortness of breath.  Dispense: 1 Inhaler; Refill: 6  3. Hyperlipidemia associated with type 2 diabetes  mellitus (HCC) Controlled - atorvastatin (LIPITOR) 80 MG tablet; Take 1 tablet (80 mg total) by mouth daily.  Dispense: 30 tablet; Refill: 6  4. Other fatigue Unknown etiology - VITAMIN D 25 Hydroxy (Vit-D Deficiency, Fractures) - TSH - T4, free  5. Muscle spasms of both lower extremities If symptoms persist will reduce dose of statin - CK  6. Chewing tobacco use Discussed cessation and has adverse effect of continuation   Meds ordered this encounter  Medications  . Fluticasone-Salmeterol (ADVAIR DISKUS) 250-50 MCG/DOSE AEPB    Sig: Inhale 1 puff into the lungs 2 (two) times daily.    Dispense:  1 each    Refill:  3  . tiotropium (SPIRIVA HANDIHALER) 18 MCG inhalation capsule    Sig: Place 1 capsule (18 mcg total) into inhaler and inhale daily.    Dispense:  30 capsule    Refill:  6  . atorvastatin (LIPITOR) 80 MG tablet    Sig: Take 1 tablet (80 mg total) by mouth daily.    Dispense:  30 tablet    Refill:  6  . albuterol (PROVENTIL HFA;VENTOLIN HFA) 108 (90 Base) MCG/ACT inhaler    Sig: Inhale 2 puffs into the lungs every 6 (six) hours as needed for wheezing or shortness of breath.    Dispense:  1 Inhaler    Refill:  6  . metFORMIN (GLUCOPHAGE XR) 500 MG 24 hr tablet     Sig: Take 1 tablet (500 mg total) by mouth 2 (two) times daily.    Dispense:  60 tablet    Refill:  6    Discontinue previous dose  . methocarbamol (ROBAXIN) 500 MG tablet    Sig: Take 1 tablet (500 mg total) by mouth 2 (two) times daily as needed for muscle spasms.    Dispense:  60 tablet    Refill:  1    Follow-up: Return in about 6 months (around 04/19/2018) for follow up of chronic medical conditions.   Charlott Rakes MD

## 2017-10-18 ENCOUNTER — Encounter: Payer: Self-pay | Admitting: Family Medicine

## 2017-10-18 LAB — T4, FREE: Free T4: 1.21 ng/dL (ref 0.82–1.77)

## 2017-10-18 LAB — TSH: TSH: 0.403 u[IU]/mL — ABNORMAL LOW (ref 0.450–4.500)

## 2017-10-18 LAB — VITAMIN D 25 HYDROXY (VIT D DEFICIENCY, FRACTURES): Vit D, 25-Hydroxy: 23.1 ng/mL — ABNORMAL LOW (ref 30.0–100.0)

## 2017-10-18 LAB — CK: Total CK: 52 U/L (ref 24–204)

## 2017-10-18 MED ORDER — ERGOCALCIFEROL 1.25 MG (50000 UT) PO CAPS: 50000.0000 [IU] | ORAL_CAPSULE | ORAL | 1 refills | Status: DC

## 2017-10-18 MED FILL — VIT D2 1.25 MG (50,000 UNIT: 1.25 MG | 28 days supply | Qty: 4 | Fill #0

## 2017-10-20 ENCOUNTER — Telehealth: Payer: Self-pay

## 2017-10-20 NOTE — Telephone Encounter (Signed)
Patient was called and informed of lab results and medication being sent to pharmacy. 

## 2017-10-23 LAB — HM DIABETES EYE EXAM

## 2017-11-03 ENCOUNTER — Ambulatory Visit (HOSPITAL_COMMUNITY)
Admission: RE | Admit: 2017-11-03 | Discharge: 2017-11-03 | Disposition: A | Discharge status: Home / Self Care | Payer: Medicaid Other | Source: Ambulatory Visit | Attending: Family Medicine | Admitting: Family Medicine

## 2017-11-03 DIAGNOSIS — J449 Chronic obstructive pulmonary disease, unspecified: Secondary | ICD-10-CM | POA: Insufficient documentation

## 2017-11-03 LAB — PULMONARY FUNCTION TEST
DL/VA % pred: 104 %
DL/VA: 4.46 ml/min/mmHg/L
DLCO UNC % PRED: 75 %
DLCO unc: 19.31 ml/min/mmHg
FEF 25-75 PRE: 0.51 L/s
FEF 25-75 Post: 0.59 L/sec
FEF2575-%CHANGE-POST: 14 %
FEF2575-%Pred-Post: 28 %
FEF2575-%Pred-Pre: 24 %
FEV1-%Change-Post: 0 %
FEV1-%PRED-POST: 64 %
FEV1-%Pred-Pre: 64 %
FEV1-POST: 1.74 L
FEV1-Pre: 1.74 L
FEV1FVC-%CHANGE-POST: 6 %
FEV1FVC-%Pred-Pre: 65 %
FEV6-%CHANGE-POST: 2 %
FEV6-%PRED-PRE: 87 %
FEV6-%Pred-Post: 89 %
FEV6-POST: 3.09 L
FEV6-PRE: 3 L
FEV6FVC-%CHANGE-POST: 9 %
FEV6FVC-%PRED-PRE: 88 %
FEV6FVC-%Pred-Post: 97 %
FVC-%CHANGE-POST: -6 %
FVC-%Pred-Post: 91 %
FVC-%Pred-Pre: 97 %
FVC-Post: 3.37 L
FVC-Pre: 3.59 L
POST FEV6/FVC RATIO: 92 %
Post FEV1/FVC ratio: 51 %
Pre FEV1/FVC ratio: 48 %
Pre FEV6/FVC Ratio: 84 %
RV % PRED: 115 %
RV: 2.48 L
TLC % pred: 98 %
TLC: 5.94 L

## 2017-11-03 MED ORDER — ALBUTEROL SULFATE (2.5 MG/3ML) 0.083% IN NEBU
2.5000 mg | INHALATION_SOLUTION | Freq: Once | RESPIRATORY_TRACT | Status: AC
  Administered 2017-11-03: 2.5 mg via RESPIRATORY_TRACT

## 2017-11-15 ENCOUNTER — Ambulatory Visit: Payer: Medicaid Other | Attending: Family Medicine | Admitting: Physician Assistant

## 2017-11-15 VITALS — BP 110/74 | HR 91 | Temp 98.2°F | Resp 18 | Ht 65.0 in | Wt 176.0 lb

## 2017-11-15 DIAGNOSIS — J449 Chronic obstructive pulmonary disease, unspecified: Secondary | ICD-10-CM | POA: Diagnosis not present

## 2017-11-15 DIAGNOSIS — E119 Type 2 diabetes mellitus without complications: Secondary | ICD-10-CM | POA: Diagnosis not present

## 2017-11-15 DIAGNOSIS — Z789 Other specified health status: Secondary | ICD-10-CM

## 2017-11-15 DIAGNOSIS — E785 Hyperlipidemia, unspecified: Secondary | ICD-10-CM

## 2017-11-15 DIAGNOSIS — Z79899 Other long term (current) drug therapy: Secondary | ICD-10-CM | POA: Diagnosis not present

## 2017-11-15 DIAGNOSIS — E1169 Type 2 diabetes mellitus with other specified complication: Secondary | ICD-10-CM

## 2017-11-15 DIAGNOSIS — Z7984 Long term (current) use of oral hypoglycemic drugs: Secondary | ICD-10-CM | POA: Insufficient documentation

## 2017-11-15 DIAGNOSIS — Z7982 Long term (current) use of aspirin: Secondary | ICD-10-CM | POA: Diagnosis not present

## 2017-11-15 DIAGNOSIS — K219 Gastro-esophageal reflux disease without esophagitis: Secondary | ICD-10-CM | POA: Insufficient documentation

## 2017-11-15 DIAGNOSIS — J40 Bronchitis, not specified as acute or chronic: Secondary | ICD-10-CM

## 2017-11-15 DIAGNOSIS — R0602 Shortness of breath: Secondary | ICD-10-CM | POA: Diagnosis present

## 2017-11-15 LAB — GLUCOSE, POCT (MANUAL RESULT ENTRY): POC GLUCOSE: 105 mg/dL — AB (ref 70–99)

## 2017-11-15 MED ORDER — IPRATROPIUM-ALBUTEROL 0.5-2.5 (3) MG/3ML IN SOLN
3.0000 mL | Freq: Once | RESPIRATORY_TRACT | Status: AC
  Administered 2017-11-15: 3 mL via RESPIRATORY_TRACT

## 2017-11-15 MED ORDER — FLUTICASONE-SALMETEROL 250-50 MCG/DOSE IN AEPB
1.0000 | INHALATION_SPRAY | Freq: Two times a day (BID) | RESPIRATORY_TRACT | 3 refills | Status: DC
Start: 2017-11-15 — End: 2018-01-18

## 2017-11-15 MED ORDER — AZITHROMYCIN 250 MG PO TABS: ORAL_TABLET | ORAL | 0 refills | Status: DC

## 2017-11-15 MED ORDER — METHYLPREDNISOLONE SODIUM SUCC 125 MG IJ SOLR
125.0000 mg | Freq: Once | INTRAMUSCULAR | Status: AC
  Administered 2017-11-15: 125 mg via INTRAMUSCULAR

## 2017-11-15 MED ORDER — ALBUTEROL SULFATE HFA 108 (90 BASE) MCG/ACT IN AERS: 2.0000 | INHALATION_SPRAY | Freq: Four times a day (QID) | RESPIRATORY_TRACT | 6 refills | Status: DC | PRN

## 2017-11-15 MED ORDER — ASPIRIN EC 81 MG PO TBEC: 81.0000 mg | DELAYED_RELEASE_TABLET | Freq: Every day | ORAL | 5 refills | Status: DC

## 2017-11-15 MED ORDER — ATORVASTATIN CALCIUM 80 MG PO TABS: 80.0000 mg | ORAL_TABLET | Freq: Every day | ORAL | 6 refills | Status: DC

## 2017-11-15 MED FILL — ADVAIR 250/50 DISKUS: 250-50 | 30 days supply | Qty: 60 | Fill #0

## 2017-11-15 MED FILL — ATORVASTATIN 80 MG TABLET: 80 | 30 days supply | Qty: 30 | Fill #0

## 2017-11-15 MED FILL — PROVENTIL HFA 108 (90 BASE): 108 (90 BAS | 25 days supply | Qty: 7 | Fill #0

## 2017-11-15 MED FILL — AZITHROMYCIN 250 MG TABLET: 250 | 5 days supply | Qty: 6 | Fill #0

## 2017-11-15 NOTE — Progress Notes (Signed)
Patient ID: David Barber, male   DOB: 1951/02/04, 67 y.o.   MRN: [id number]     David Barber, is a 67 y.o. male  TFT:[id number]  [id number]  DOB - [birth year]  Subjective:  Chief Complaint and HPI: David Barber is a 67 y.o. male here today for SOB, wheezing, and cough.  This has been going on for about 2 weeks and getting progressively worse.  Some green and yellow and mucus.  No fever/chills.  No recent travel.  He has not been using Advair, but does use albuterol.    Blood sugars are controlled.   Social: from [place]; lives here [place] family.  No recent travel.   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  ROS:   Constitutional:  No f/c, No night sweats, No unexplained weight loss. EENT:  No vision changes, No blurry vision, No hearing changes. No mouth, throat, or ear problems.  Respiratory: +cough, wheezing Cardiac: No CP, no palpitations GI:  No abd pain, No N/V/D. GU: No Urinary s/sx Musculoskeletal: No joint pain Neuro: No headache, no dizziness, no motor weakness.  Skin: No rash Endocrine:  No polydipsia. No polyuria.  Psych: Denies SI/HI  No problems updated.  ALLERGIES: No Known Allergies  PAST MEDICAL HISTORY: Past Medical History:  Diagnosis Date  . Bronchitis   . COPD (chronic obstructive pulmonary disease) ([place])   . GERD (gastroesophageal reflux disease)     MEDICATIONS AT HOME: Prior to Admission medications   Medication Sig Start Date End Date Taking? Authorizing Provider  albuterol (PROVENTIL HFA;VENTOLIN HFA) 108 (90 Base) MCG/ACT inhaler Inhale 2 puffs into the lungs every 6 (six) hours as needed for wheezing or shortness of breath. 11/15/17   Argentina Donovan, PA-C  aspirin EC 81 MG tablet Take 1 tablet (81 mg total) by mouth daily. 04/18/17   Charlott Rakes, MD  atorvastatin (LIPITOR) 80 MG tablet Take 1 tablet (80 mg total) by mouth daily. 10/17/17   Charlott Rakes, MD  azithromycin (ZITHROMAX) 250 MG tablet Take 2 today then 1 daily  11/15/17   Argentina Donovan, PA-C  cetirizine (ZYRTEC) 10 MG tablet Take 1 tablet (10 mg total) by mouth daily. Patient not taking: Reported on 04/18/2017 02/15/17   Argentina Donovan, PA-C  ergocalciferol (DRISDOL) 50000 units capsule Take 1 capsule (50,000 Units total) by mouth once a week. 10/18/17   Charlott Rakes, MD  Fluticasone-Salmeterol (ADVAIR DISKUS) 250-50 MCG/DOSE AEPB Inhale 1 puff into the lungs 2 (two) times daily. 11/15/17   Argentina Donovan, PA-C  hydrocortisone cream 0.5 % Apply 1 application topically 2 (two) times daily. 07/18/17   Charlott Rakes, MD  ketotifen (ZADITOR) 0.025 % ophthalmic solution Place 1 drop into both eyes 2 (two) times daily. Patient not taking: Reported on 04/18/2017 02/15/17   Argentina Donovan, PA-C  metFORMIN (GLUCOPHAGE XR) 500 MG 24 hr tablet Take 1 tablet (500 mg total) by mouth 2 (two) times daily. 10/17/17   Charlott Rakes, MD  methocarbamol (ROBAXIN) 500 MG tablet Take 1 tablet (500 mg total) by mouth 2 (two) times daily as needed for muscle spasms. 10/17/17   Charlott Rakes, MD  Olopatadine HCl (PATADAY) 0.2 % SOLN Place 1 drop into left eye once daily 07/18/17   Charlott Rakes, MD  predniSONE (DELTASONE) 20 MG tablet Take 1 tablet (20 mg total) by mouth daily with breakfast. Patient not taking: Reported on 07/18/2017 04/18/17   Charlott Rakes, MD  tiotropium (SPIRIVA HANDIHALER) 18 MCG inhalation capsule Place 1 capsule (18 mcg  total) into inhaler and inhale daily. 10/17/17   Charlott Rakes, MD     Objective:  EXAM:   Vitals:   11/15/17 0912  BP: 110/74  Pulse: 91  Resp: 18  Temp: 98.2 F (36.8 C)  TempSrc: Oral  SpO2: 90%  Weight: 176 lb (79.8 kg)  Height: 5\' 5"  (1.651 m)    General appearance : A&OX3. NAD. Non-toxic-appearing HEENT: Atraumatic and Normocephalic.  PERRLA. EOM intact.  TM clear B. Mouth-MMM, post pharynx WNL w/o erythema, No PND. Neck: supple, no JVD. No cervical lymphadenopathy. No thyromegaly Chest/Lungs:   Breathing-non-labored, fair air entry bilaterally, breath sounds coarse and diffuse moderate wheezing throughout prior to duoneb.  No rales or rhonchi. Pulse ox after treatment 94% CVS: S1 S2 regular, no murmurs, gallops, rubs  Extremities: Bilateral Lower Ext shows no edema, both legs are warm to touch with = pulse throughout Neurology:  CN II-XII grossly intact, Non focal.   Psych:  TP linear. J/I WNL. Normal speech. Appropriate eye contact and affect.  Skin:  No Rash  Data Review Lab Results  Component Value Date   HGBA1C 6.6 10/17/2017   HGBA1C 6.5 07/18/2017   HGBA1C 6.9 04/18/2017     Assessment & Plan   1. Bronchitis - methylPREDNISolone sodium succinate (SOLU-MEDROL) 125 mg/2 mL injection 125 mg - azithromycin (ZITHROMAX) 250 MG tablet; Take 2 today then 1 daily  Dispense: 6 tablet; Refill: 0  2. Controlled type 2 diabetes mellitus without complication, without long-term current use of insulin (HCC) Controlled-continue current regimen - Glucose (CBG)  3. COPD, mild ([place]) Uncontrolled- resume- Fluticasone-Salmeterol (ADVAIR DISKUS) 250-50 MCG/DOSE AEPB; Inhale 1 puff into the lungs 2 (two) times daily.  Dispense: 1 each; Refill: 3 - albuterol (PROVENTIL HFA;VENTOLIN HFA) 108 (90 Base) MCG/ACT inhaler; Inhale 2 puffs into the lungs every 6 (six) hours as needed for wheezing or shortness of breath.  Dispense: 1 Inhaler; Refill: 6 - methylPREDNISolone sodium succinate (SOLU-MEDROL) 125 mg/2 mL injection 125 mg - ipratropium-albuterol (DUONEB) 0.5-2.5 (3) MG/3ML nebulizer solution 3 mL  4. Language barrier [employer] interpreters used and additional time performing visit was required.   Patient have been counseled extensively about nutrition and exercise  Return in about 2 months (around 01/15/2018) for Dr Margarita Rana; f/up DM and COPD.  The patient was given clear instructions to go to ER or return to medical center if symptoms don't improve, worsen or new problems develop. The  patient verbalized understanding. The patient was told to call to get lab results if they haven't heard anything in the next week.     Freeman Caldron, PA-C [facility] and [facility] [place], [place]   11/15/2017, 9:25 AM

## 2017-12-11 MED FILL — METHOCARBAMOL 500 MG TABS: 500 | 30 days supply | Qty: 60 | Fill #1

## 2017-12-11 MED FILL — metFORMIN HCL ER 500 MG TB2: 500 | 30 days supply | Qty: 60 | Fill #1

## 2017-12-11 MED FILL — ATORVASTATIN 80 MG TABLET: 80 | 30 days supply | Qty: 30 | Fill #1

## 2018-01-17 ENCOUNTER — Telehealth: Payer: Self-pay | Admitting: Family Medicine

## 2018-01-17 NOTE — Telephone Encounter (Signed)
Patient's son called to schedule an appointment however, because there was no DPR on file I called interpreter to get in contact with patient regarding appointment. Translator [id number]

## 2018-01-17 NOTE — Telephone Encounter (Signed)
Patient was reached with interpreter at [phone]

## 2018-01-18 ENCOUNTER — Ambulatory Visit: Payer: Medicaid Other | Attending: Family Medicine | Admitting: Physician Assistant

## 2018-01-18 VITALS — BP 108/74 | HR 101 | Temp 98.0°F | Resp 16 | Ht 65.0 in | Wt 176.0 lb

## 2018-01-18 DIAGNOSIS — Z79899 Other long term (current) drug therapy: Secondary | ICD-10-CM | POA: Insufficient documentation

## 2018-01-18 DIAGNOSIS — J449 Chronic obstructive pulmonary disease, unspecified: Secondary | ICD-10-CM | POA: Diagnosis not present

## 2018-01-18 DIAGNOSIS — E785 Hyperlipidemia, unspecified: Secondary | ICD-10-CM | POA: Diagnosis not present

## 2018-01-18 DIAGNOSIS — R05 Cough: Secondary | ICD-10-CM | POA: Diagnosis present

## 2018-01-18 DIAGNOSIS — Z789 Other specified health status: Secondary | ICD-10-CM

## 2018-01-18 DIAGNOSIS — Z7982 Long term (current) use of aspirin: Secondary | ICD-10-CM | POA: Diagnosis not present

## 2018-01-18 DIAGNOSIS — Z7984 Long term (current) use of oral hypoglycemic drugs: Secondary | ICD-10-CM | POA: Diagnosis not present

## 2018-01-18 DIAGNOSIS — R062 Wheezing: Secondary | ICD-10-CM | POA: Insufficient documentation

## 2018-01-18 DIAGNOSIS — J40 Bronchitis, not specified as acute or chronic: Secondary | ICD-10-CM

## 2018-01-18 DIAGNOSIS — E1169 Type 2 diabetes mellitus with other specified complication: Secondary | ICD-10-CM

## 2018-01-18 DIAGNOSIS — R0902 Hypoxemia: Secondary | ICD-10-CM | POA: Insufficient documentation

## 2018-01-18 DIAGNOSIS — E119 Type 2 diabetes mellitus without complications: Secondary | ICD-10-CM | POA: Diagnosis not present

## 2018-01-18 LAB — GLUCOSE, POCT (MANUAL RESULT ENTRY): POC Glucose: 121 mg/dl — AB (ref 70–99)

## 2018-01-18 MED ORDER — PREDNISONE 10 MG PO TABS: ORAL_TABLET | ORAL | 0 refills | Status: DC

## 2018-01-18 MED ORDER — ATORVASTATIN CALCIUM 80 MG PO TABS: 80.0000 mg | ORAL_TABLET | Freq: Every day | ORAL | 6 refills | Status: DC

## 2018-01-18 MED ORDER — METHYLPREDNISOLONE SODIUM SUCC 125 MG IJ SOLR
125.0000 mg | Freq: Once | INTRAMUSCULAR | Status: AC
  Administered 2018-01-18: 125 mg via INTRAMUSCULAR

## 2018-01-18 MED ORDER — IPRATROPIUM-ALBUTEROL 0.5-2.5 (3) MG/3ML IN SOLN
3.0000 mL | Freq: Once | RESPIRATORY_TRACT | Status: AC
  Administered 2018-01-18: 3 mL via RESPIRATORY_TRACT

## 2018-01-18 MED ORDER — ALBUTEROL SULFATE HFA 108 (90 BASE) MCG/ACT IN AERS: 2.0000 | INHALATION_SPRAY | Freq: Four times a day (QID) | RESPIRATORY_TRACT | 6 refills | Status: DC | PRN

## 2018-01-18 MED ORDER — FLUTICASONE-SALMETEROL 250-50 MCG/DOSE IN AEPB: 1.0000 | INHALATION_SPRAY | Freq: Two times a day (BID) | RESPIRATORY_TRACT | 3 refills | Status: DC

## 2018-01-18 MED ORDER — METFORMIN HCL ER 500 MG PO TB24: 500.0000 mg | ORAL_TABLET | Freq: Two times a day (BID) | ORAL | 6 refills | Status: DC

## 2018-01-18 MED ORDER — AZITHROMYCIN 250 MG PO TABS: ORAL_TABLET | ORAL | 0 refills | Status: DC

## 2018-01-18 MED FILL — ADVAIR 250/50 DISKUS: 250-50 | 30 days supply | Qty: 60 | Fill #0

## 2018-01-18 MED FILL — predniSONE 10 MG TABS: 10 | 7 days supply | Qty: 21 | Fill #0

## 2018-01-18 MED FILL — ATORVASTATIN 80 MG TABLET: 80 | 30 days supply | Qty: 30 | Fill #0

## 2018-01-18 MED FILL — VENTOLIN HFA 90 MCG INHALER: 108 (90 BAS | 25 days supply | Qty: 18 | Fill #0

## 2018-01-18 MED FILL — METFORMIN HCL ER 500 MG TAB: 500 | 30 days supply | Qty: 60 | Fill #0

## 2018-01-18 MED FILL — AZITHROMYCIN 250 MG TABLET: 250 | 5 days supply | Qty: 6 | Fill #0

## 2018-01-18 NOTE — Patient Instructions (Signed)
If you worsen or your blood sugars are over 400, go to the emergency room.

## 2018-01-18 NOTE — Progress Notes (Signed)
Patient ID: David Barber, male   DOB: Aug 07, 1950, 67 y.o.   MRN: [id number]      David Barber, is a 67 y.o. male  NOM:[id number]  JGG:[id number]  DOB - July 04, 1950  Subjective:  Chief Complaint and HPI: David Barber is a 67 y.o. male here today Wheezing and coughing for about 1 week.  Mucus is thick and yellow.  No fever.  Appetite is ok.  No GI s/sx.     [employer] interpreters translating "Hari." using both inhalers.  Blood sugars running under 150  ROS:   Constitutional:  No f/c, No night sweats, No unexplained weight loss. EENT:  No vision changes, No blurry vision, No hearing changes. No mouth, throat, or ear problems.  Respiratory: + cough, + SOB/wheezing Cardiac: No CP, no palpitations GI:  No abd pain, No N/V/D. GU: No Urinary s/sx Musculoskeletal: No joint pain Neuro: No headache, no dizziness, no motor weakness.  Skin: No rash Endocrine:  No polydipsia. No polyuria.  Psych: Denies SI/HI  No problems updated.  ALLERGIES: No Known Allergies  PAST MEDICAL HISTORY: Past Medical History:  Diagnosis Date  . Bronchitis   . COPD (chronic obstructive pulmonary disease) ([place])   . GERD (gastroesophageal reflux disease)     MEDICATIONS AT HOME: Prior to Admission medications   Medication Sig Start Date End Date Taking? Authorizing Provider  albuterol (PROVENTIL HFA;VENTOLIN HFA) 108 (90 Base) MCG/ACT inhaler Inhale 2 puffs into the lungs every 6 (six) hours as needed for wheezing or shortness of breath. 01/18/18  Yes McClung, Angela M, PA-C  atorvastatin (LIPITOR) 80 MG tablet Take 1 tablet (80 mg total) by mouth daily. 01/18/18  Yes McClung, Angela M, PA-C  Fluticasone-Salmeterol (ADVAIR DISKUS) 250-50 MCG/DOSE AEPB Inhale 1 puff into the lungs 2 (two) times daily. 01/18/18  Yes Freeman Caldron M, PA-C  metFORMIN (GLUCOPHAGE XR) 500 MG 24 hr tablet Take 1 tablet (500 mg total) by mouth 2 (two) times daily. 01/18/18  Yes Argentina Donovan, PA-C  aspirin EC 81 MG tablet  Take 1 tablet (81 mg total) by mouth daily. Patient not taking: Reported on 01/18/2018 11/15/17   Argentina Donovan, PA-C  azithromycin [facility]) 250 MG tablet Take 2 today then 1 daily 01/18/18   Argentina Donovan, PA-C  cetirizine (ZYRTEC) 10 MG tablet Take 1 tablet (10 mg total) by mouth daily. Patient not taking: Reported on 04/18/2017 02/15/17   Argentina Donovan, PA-C  ergocalciferol (DRISDOL) 50000 units capsule Take 1 capsule (50,000 Units total) by mouth once a week. Patient not taking: Reported on 01/18/2018 10/18/17   Charlott Rakes, MD  hydrocortisone cream 0.5 % Apply 1 application topically 2 (two) times daily. Patient not taking: Reported on 01/18/2018 07/18/17   Charlott Rakes, MD  ketotifen (ZADITOR) 0.025 % ophthalmic solution Place 1 drop into both eyes 2 (two) times daily. Patient not taking: Reported on 04/18/2017 02/15/17   Argentina Donovan, PA-C  methocarbamol (ROBAXIN) 500 MG tablet Take 1 tablet (500 mg total) by mouth 2 (two) times daily as needed for muscle spasms. Patient not taking: Reported on 01/18/2018 10/17/17   Charlott Rakes, MD  Olopatadine HCl (PATADAY) 0.2 % SOLN Place 1 drop into left eye once daily Patient not taking: Reported on 01/18/2018 07/18/17   Charlott Rakes, MD  predniSONE (DELTASONE) 10 MG tablet 6,5,4,3,2,1 take each days dose all at once with food in the morning 01/18/18   Argentina Donovan, PA-C  tiotropium (SPIRIVA HANDIHALER) 18 MCG inhalation capsule Place 1  capsule (18 mcg total) into inhaler and inhale daily. Patient not taking: Reported on 01/18/2018 10/17/17   Charlott Rakes, MD     Objective:  EXAM:   Vitals:   01/18/18 0850  BP: 108/74  Pulse: (!) 101  Resp: 16  Temp: 98 F (36.7 C)  TempSrc: Oral  SpO2: (!) 86%  Weight: 176 lb (79.8 kg)  Height: 5\' 5"  (1.651 m)    General appearance : A&OX3. NAD. Non-toxic-appearing(pulse ox usu ~94% on RA HEENT: Atraumatic and Normocephalic.  PERRLA. EOM intact. Neck: supple, no  JVD. No cervical lymphadenopathy. No thyromegaly Chest/Lungs:  Breathing-non-labored, fai air entry bilaterally, breath sounds with moderate wheezing throughout.  No rales/rhochi CVS: S1 S2 regular, no murmurs, gallops, rubs  Extremities: Bilateral Lower Ext shows no edema, both legs are warm to touch with = pulse throughout Neurology:  CN II-XII grossly intact, Non focal.   Psych:  TP linear. J/I WNL. Normal speech. Appropriate eye contact and affect.  Skin:  No Rash  Data Review Lab Results  Component Value Date   HGBA1C 6.6 10/17/2017   HGBA1C 6.5 07/18/2017   HGBA1C 6.9 04/18/2017     Assessment & Plan   1. Controlled type 2 diabetes mellitus without complication, without long-term current use of insulin (HCC) Stable-f/up next week for labs/A1C to see PCP - Glucose (CBG) - metFORMIN (GLUCOPHAGE XR) 500 MG 24 hr tablet; Take 1 tablet (500 mg total) by mouth 2 (two) times daily.  Dispense: 60 tablet; Refill: 6  2. Language barrier [employer] interpreters used and additional time performing visit was required.  3. Bronchitis - azithromycin (ZITHROMAX) 250 MG tablet; Take 2 today then 1 daily  Dispense: 6 tablet; Refill: 0 - predniSONE (DELTASONE) 10 MG tablet; 6,5,4,3,2,1 take each days dose all at once with food in the morning  Dispense: 21 tablet; Refill: 0 - methylPREDNISolone sodium succinate (SOLU-MEDROL) 125 mg/2 mL injection 125 mg - ipratropium-albuterol (DUONEB) 0.5-2.5 (3) MG/3ML nebulizer solution 3 mL  4. COPD, mild (HCC) - Fluticasone-Salmeterol (ADVAIR DISKUS) 250-50 MCG/DOSE AEPB; Inhale 1 puff into the lungs 2 (two) times daily.  Dispense: 1 each; Refill: 3 - albuterol (PROVENTIL HFA;VENTOLIN HFA) 108 (90 Base) MCG/ACT inhaler; Inhale 2 puffs into the lungs every 6 (six) hours as needed for wheezing or shortness of breath.  Dispense: 1 Inhaler; Refill: 6  5. Hyperlipidemia associated with type 2 diabetes mellitus (HCC) - atorvastatin (LIPITOR) 80 MG tablet; Take 1  tablet (80 mg total) by mouth daily.  Dispense: 30 tablet; Refill: 6  6. Hypoxia Improved after duoneb pulse Ox up to 89% after treatment.  I have advised them to go to the ED if he worsens at all/breathing becomes more distressed or blood sugars >400 or symptomatic.     Patient have been counseled extensively about nutrition and exercise  Return for keep appt with Dr Margarita Rana.  The patient was given clear instructions to go to ER or return to medical center if symptoms don't improve, worsen or new problems develop. The patient verbalized understanding. The patient was told to call to get lab results if they haven't heard anything in the next week.     Freeman Caldron, PA-C [facility] and [facility] [place], [place]   01/18/2018, 9:04 AM

## 2018-01-22 ENCOUNTER — Ambulatory Visit: Payer: Medicaid Other | Attending: Family Medicine | Admitting: Family Medicine

## 2018-01-22 ENCOUNTER — Encounter: Payer: Self-pay | Admitting: Family Medicine

## 2018-01-22 VITALS — BP 137/80 | HR 89 | Temp 98.0°F | Ht 65.0 in | Wt 179.0 lb

## 2018-01-22 DIAGNOSIS — E785 Hyperlipidemia, unspecified: Secondary | ICD-10-CM | POA: Insufficient documentation

## 2018-01-22 DIAGNOSIS — Z7982 Long term (current) use of aspirin: Secondary | ICD-10-CM | POA: Diagnosis not present

## 2018-01-22 DIAGNOSIS — K219 Gastro-esophageal reflux disease without esophagitis: Secondary | ICD-10-CM | POA: Diagnosis not present

## 2018-01-22 DIAGNOSIS — R0982 Postnasal drip: Secondary | ICD-10-CM

## 2018-01-22 DIAGNOSIS — Z7951 Long term (current) use of inhaled steroids: Secondary | ICD-10-CM | POA: Diagnosis not present

## 2018-01-22 DIAGNOSIS — J449 Chronic obstructive pulmonary disease, unspecified: Secondary | ICD-10-CM | POA: Diagnosis not present

## 2018-01-22 DIAGNOSIS — Z79899 Other long term (current) drug therapy: Secondary | ICD-10-CM | POA: Diagnosis not present

## 2018-01-22 DIAGNOSIS — E1149 Type 2 diabetes mellitus with other diabetic neurological complication: Secondary | ICD-10-CM

## 2018-01-22 DIAGNOSIS — E559 Vitamin D deficiency, unspecified: Secondary | ICD-10-CM | POA: Diagnosis not present

## 2018-01-22 DIAGNOSIS — Z7984 Long term (current) use of oral hypoglycemic drugs: Secondary | ICD-10-CM | POA: Diagnosis not present

## 2018-01-22 DIAGNOSIS — E119 Type 2 diabetes mellitus without complications: Secondary | ICD-10-CM | POA: Diagnosis not present

## 2018-01-22 DIAGNOSIS — E1140 Type 2 diabetes mellitus with diabetic neuropathy, unspecified: Secondary | ICD-10-CM | POA: Insufficient documentation

## 2018-01-22 LAB — POCT GLYCOSYLATED HEMOGLOBIN (HGB A1C): HBA1C, POC (CONTROLLED DIABETIC RANGE): 6.5 % (ref 0.0–7.0)

## 2018-01-22 LAB — GLUCOSE, POCT (MANUAL RESULT ENTRY): POC Glucose: 114 mg/dl — AB (ref 70–99)

## 2018-01-22 MED ORDER — METHOCARBAMOL 500 MG PO TABS: 500.0000 mg | ORAL_TABLET | Freq: Two times a day (BID) | ORAL | 1 refills | Status: DC | PRN

## 2018-01-22 MED ORDER — TIOTROPIUM BROMIDE MONOHYDRATE 18 MCG IN CAPS: 18.0000 ug | ORAL_CAPSULE | Freq: Every day | RESPIRATORY_TRACT | 6 refills | Status: DC

## 2018-01-22 MED ORDER — GABAPENTIN 300 MG PO CAPS: 300.0000 mg | ORAL_CAPSULE | Freq: Every day | ORAL | 3 refills | Status: DC

## 2018-01-22 MED ORDER — CETIRIZINE HCL 10 MG PO TABS: 10.0000 mg | ORAL_TABLET | Freq: Every day | ORAL | 11 refills | Status: DC

## 2018-01-22 MED FILL — GABAPENTIN 300 MG CAPSULE: 300 | 30 days supply | Qty: 30 | Fill #0

## 2018-01-22 MED FILL — CETIRIZINE HCL 10 MG TABLET: 10 | 30 days supply | Qty: 30 | Fill #0

## 2018-01-22 MED FILL — METHOCARBAMOL 500 MG TABS: 500 | 30 days supply | Qty: 60 | Fill #0

## 2018-01-22 MED FILL — SPIRIVA 18 MCG CP-HANDIHALE: 18 | 30 days supply | Qty: 30 | Fill #0

## 2018-01-22 NOTE — Progress Notes (Signed)
Patient has had a cough for 10 days and can not sleep at night.

## 2018-01-22 NOTE — Progress Notes (Signed)
Subjective:  Patient ID: David Barber, male    DOB: [birth year]  Age: 67 y.o. MRN: [id number]  CC: Diabetes   HPI David Barber  is a 66 year old male with a history of COPD, type 2 diabetes mellitus (A1c 6.5), hyperlipidemia COPD who presents today for follow-up visit seen with the aid of a Nepali video interpreter. Today he complains of a 10-day history of cough at night and prevents him from sleeping with associated postnasal drip but denies productive cough.  He denies shortness of breath, wheezing, fevers. Last week he was treated for COPD exacerbation with azithromycin, prednisone and nebulizer treatments and he reports that cough has improved.  He continues to chew tobacco. His diabetes is controlled and he denies hypoglycemia or visual concerns but does have numbness in his feet which is intermittent. He has been compliant with his statin.  Past Medical History:  Diagnosis Date  . Bronchitis   . COPD (chronic obstructive pulmonary disease) ([place])   . GERD (gastroesophageal reflux disease)     Past Surgical History:  Procedure Laterality Date  . CYST REMOVAL NECK  2013   benign    No Known Allergies   Outpatient Medications Prior to Visit  Medication Sig Dispense Refill  . albuterol (PROVENTIL HFA;VENTOLIN HFA) 108 (90 Base) MCG/ACT inhaler Inhale 2 puffs into the lungs every 6 (six) hours as needed for wheezing or shortness of breath. 1 Inhaler 6  . atorvastatin (LIPITOR) 80 MG tablet Take 1 tablet (80 mg total) by mouth daily. 30 tablet 6  . Fluticasone-Salmeterol (ADVAIR DISKUS) 250-50 MCG/DOSE AEPB Inhale 1 puff into the lungs 2 (two) times daily. 1 each 3  . metFORMIN (GLUCOPHAGE XR) 500 MG 24 hr tablet Take 1 tablet (500 mg total) by mouth 2 (two) times daily. 60 tablet 6  . methocarbamol (ROBAXIN) 500 MG tablet Take 1 tablet (500 mg total) by mouth 2 (two) times daily as needed for muscle spasms. 60 tablet 1  . predniSONE (DELTASONE) 10 MG tablet  6,5,4,3,2,1 take each days dose all at once with food in the morning 21 tablet 0  . aspirin EC 81 MG tablet Take 1 tablet (81 mg total) by mouth daily. (Patient not taking: Reported on 01/18/2018) 30 tablet 5  . ergocalciferol (DRISDOL) 50000 units capsule Take 1 capsule (50,000 Units total) by mouth once a week. (Patient not taking: Reported on 01/18/2018) 9 capsule 1  . hydrocortisone cream 0.5 % Apply 1 application topically 2 (two) times daily. (Patient not taking: Reported on 01/18/2018) 66 g 0  . ketotifen (ZADITOR) 0.025 % ophthalmic solution Place 1 drop into both eyes 2 (two) times daily. (Patient not taking: Reported on 04/18/2017) 5 mL 1  . azithromycin (ZITHROMAX) 250 MG tablet Take 2 today then 1 daily (Patient not taking: Reported on 01/22/2018) 6 tablet 0  . cetirizine (ZYRTEC) 10 MG tablet Take 1 tablet (10 mg total) by mouth daily. (Patient not taking: Reported on 04/18/2017) 30 tablet 11  . Olopatadine HCl (PATADAY) 0.2 % SOLN Place 1 drop into left eye once daily (Patient not taking: Reported on 01/18/2018) 5 mL 1  . tiotropium (SPIRIVA HANDIHALER) 18 MCG inhalation capsule Place 1 capsule (18 mcg total) into inhaler and inhale daily. (Patient not taking: Reported on 01/18/2018) 30 capsule 6   No facility-administered medications prior to visit.     ROS Review of Systems  Constitutional: Negative for activity change and appetite change.  HENT: Positive for postnasal drip. Negative for sinus pressure  and sore throat.   Eyes: Negative for visual disturbance.  Respiratory: Positive for cough. Negative for chest tightness and shortness of breath.   Cardiovascular: Negative for chest pain and leg swelling.  Gastrointestinal: Negative for abdominal distention, abdominal pain, constipation and diarrhea.  Endocrine: Negative.   Genitourinary: Negative for dysuria.  Musculoskeletal: Negative for joint swelling and myalgias.  Skin: Negative for rash.  Allergic/Immunologic: Negative.     Neurological: Positive for numbness. Negative for weakness and light-headedness.  Psychiatric/Behavioral: Negative for dysphoric mood and suicidal ideas.    Objective:  BP 137/80   Pulse 89   Temp 98 F (36.7 C) (Oral)   Ht 5\' 5"  (1.651 m)   Wt 179 lb (81.2 kg)   SpO2 94%   BMI 29.79 kg/m   BP/Weight 01/22/2018 01/18/2018 8/85/0277  Systolic BP 412 878 676  Diastolic BP 80 74 74  Wt. (Lbs) 179 176 176  BMI 29.79 29.29 29.29      Physical Exam  Constitutional: He is oriented to person, place, and time. He appears well-developed and well-nourished.  Neck: No JVD present.  Cardiovascular: Normal rate, normal heart sounds and intact distal pulses.  No murmur heard. Pulmonary/Chest: Effort normal and breath sounds normal. He has no wheezes. He has no rales. He exhibits no tenderness.  Abdominal: Soft. Bowel sounds are normal. He exhibits no distension and no mass. There is no tenderness.  Musculoskeletal: Normal range of motion.  Neurological: He is alert and oriented to person, place, and time.  Skin: Skin is warm and dry.  Psychiatric: He has a normal mood and affect.    CMP Latest Ref Rng & Units 04/18/2017 10/07/2016 07/21/2015  Glucose 65 - 99 mg/dL 98 98 111(H)  BUN 8 - 27 mg/dL 9 7(L) 11  Creatinine 0.76 - 1.27 mg/dL 0.81 0.86 0.79  Sodium 134 - 144 mmol/L 142 142 140  Potassium 3.5 - 5.2 mmol/L 4.5 4.3 4.2  Chloride 96 - 106 mmol/L 102 102 102  CO2 20 - 29 mmol/L 24 22 27   Calcium 8.6 - 10.2 mg/dL 9.2 9.5 9.4  Total Protein 6.0 - 8.5 g/dL 7.6 7.7 -  Total Bilirubin 0.0 - 1.2 mg/dL 0.4 0.6 -  Alkaline Phos 39 - 117 IU/L 141(H) 125(H) -  AST 0 - 40 IU/L 24 33 -  ALT 0 - 44 IU/L 26 32 -    Lipid Panel     Component Value Date/Time   CHOL 167 04/18/2017 0931   TRIG 269 (H) 04/18/2017 0931   HDL 34 (L) 04/18/2017 0931   CHOLHDL 4.9 04/18/2017 0931   CHOLHDL 6.5 (H) 07/21/2015 1625   VLDL 61 (H) 07/21/2015 1625   LDLCALC 79 04/18/2017 0931    Lab Results   Component Value Date   HGBA1C 6.5 01/22/2018     Assessment & Plan:   1. Controlled type 2 diabetes mellitus without complication, without long-term current use of insulin (HCC) Controlled with A1c of 6.5 Continue current regimen Counseled on Diabetic diet, my plate method, 720 minutes of moderate intensity exercise/week Keep blood sugar logs with fasting goals of 80-120 mg/dl, random of less than 180 and in the event of sugars less than 60 mg/dl or greater than 400 mg/dl please notify the clinic ASAP. It is recommended that you undergo annual eye exams and annual foot exams. Pneumonia vaccine is recommended. - POCT glucose (manual entry) - POCT glycosylated hemoglobin (Hb N4B) - Basic Metabolic Panel  2. Post-nasal drip Could explain his cough  Advised to comply with Zyrtec - cetirizine (ZYRTEC) 10 MG tablet; Take 1 tablet (10 mg total) by mouth daily.  Dispense: 30 tablet; Refill: 11  3. COPD, mild ([place]) Completed treatment for COPD exacerbation last week with improvement in symptoms Continue Advair, Proventil  Tobacco cessation to decrease mortality - tiotropium (SPIRIVA HANDIHALER) 18 MCG inhalation capsule; Place 1 capsule (18 mcg total) into inhaler and inhale daily.  Dispense: 30 capsule; Refill: 6  4. Vitamin D deficiency Completed treatment with Drisdol We will recheck levels - VITAMIN D 25 Hydroxy (Vit-D Deficiency, Fractures)  5. Other diabetic neurological complication associated with type 2 diabetes mellitus ([place]) New onset neuropathy Commence gabapentin - gabapentin (NEURONTIN) 300 MG capsule; Take 1 capsule (300 mg total) by mouth at bedtime.  Dispense: 30 capsule; Refill: 3   Meds ordered this encounter  Medications  . cetirizine (ZYRTEC) 10 MG tablet    Sig: Take 1 tablet (10 mg total) by mouth daily.    Dispense:  30 tablet    Refill:  11  . gabapentin (NEURONTIN) 300 MG capsule    Sig: Take 1 capsule (300 mg total) by mouth at bedtime.    Dispense:   30 capsule    Refill:  3  . methocarbamol (ROBAXIN) 500 MG tablet    Sig: Take 1 tablet (500 mg total) by mouth 2 (two) times daily as needed for muscle spasms.    Dispense:  60 tablet    Refill:  1  . tiotropium (SPIRIVA HANDIHALER) 18 MCG inhalation capsule    Sig: Place 1 capsule (18 mcg total) into inhaler and inhale daily.    Dispense:  30 capsule    Refill:  6    Follow-up: Return in about 3 months (around 04/24/2018) for Follow-up of chronic medical conditions.   Charlott Rakes MD

## 2018-01-22 NOTE — Patient Instructions (Signed)

## 2018-01-23 ENCOUNTER — Other Ambulatory Visit: Payer: Self-pay | Admitting: Family Medicine

## 2018-01-23 LAB — BASIC METABOLIC PANEL
BUN / CREAT RATIO: 16 (ref 10–24)
BUN: 12 mg/dL (ref 8–27)
CALCIUM: 9.9 mg/dL (ref 8.6–10.2)
CHLORIDE: 101 mmol/L (ref 96–106)
CO2: 27 mmol/L (ref 20–29)
CREATININE: 0.77 mg/dL (ref 0.76–1.27)
GFR calc Af Amer: 108 mL/min/{1.73_m2} (ref >59)
GFR calc non Af Amer: 93 mL/min/{1.73_m2} (ref >59)
Glucose: 101 mg/dL — ABNORMAL HIGH (ref 65–99)
Potassium: 3.9 mmol/L (ref 3.5–5.2)
Sodium: 142 mmol/L (ref 134–144)

## 2018-01-23 LAB — VITAMIN D 25 HYDROXY (VIT D DEFICIENCY, FRACTURES): VIT D 25 HYDROXY: 17.3 ng/mL — AB (ref 30.0–100.0)

## 2018-01-23 MED ORDER — ERGOCALCIFEROL 1.25 MG (50000 UT) PO CAPS: 50000.0000 [IU] | ORAL_CAPSULE | ORAL | 1 refills | Status: DC

## 2018-02-14 ENCOUNTER — Telehealth: Payer: Self-pay

## 2018-02-14 NOTE — Telephone Encounter (Signed)
-----   Message from Charlott Rakes, MD sent at 01/23/2018 12:47 PM EST ----- Vitamin D level is low. I have sent a rx for Drisdol to his Pharmacy.

## 2018-02-14 NOTE — Telephone Encounter (Signed)
Patient was called and informed of lab results.[phone])

## 2018-03-12 MED FILL — METFORMIN HCL ER 500 MG TAB: 500 | 30 days supply | Qty: 60 | Fill #1

## 2018-03-12 MED FILL — GABAPENTIN 300 MG CAPSULE: 300 | 30 days supply | Qty: 30 | Fill #1

## 2018-03-12 MED FILL — ATORVASTATIN 80 MG TABLET: 80 | 30 days supply | Qty: 30 | Fill #1

## 2018-03-12 MED FILL — METHOCARBAMOL 500 MG TABS: 500 | 30 days supply | Qty: 60 | Fill #1

## 2018-03-12 MED FILL — CETIRIZINE HCL 10 MG TABLET: 10 | 30 days supply | Qty: 30 | Fill #1

## 2018-04-11 MED FILL — ATORVASTATIN 80 MG TABLET: 80 | 30 days supply | Qty: 30 | Fill #2

## 2018-04-11 MED FILL — CETIRIZINE HCL 10 MG TABLET: 10 | 30 days supply | Qty: 30 | Fill #2

## 2018-04-11 MED FILL — METFORMIN HCL ER 500 MG TAB: 500 | 30 days supply | Qty: 60 | Fill #2

## 2018-04-11 MED FILL — GABAPENTIN 300 MG CAPSULE: 300 | 30 days supply | Qty: 30 | Fill #2

## 2018-04-30 ENCOUNTER — Encounter: Payer: Self-pay | Admitting: Family Medicine

## 2018-04-30 ENCOUNTER — Ambulatory Visit: Payer: Medicaid Other | Attending: Family Medicine | Admitting: Family Medicine

## 2018-04-30 VITALS — BP 117/82 | HR 114 | Temp 97.8°F | Ht 65.0 in | Wt 183.0 lb

## 2018-04-30 DIAGNOSIS — E1149 Type 2 diabetes mellitus with other diabetic neurological complication: Secondary | ICD-10-CM

## 2018-04-30 DIAGNOSIS — E1142 Type 2 diabetes mellitus with diabetic polyneuropathy: Secondary | ICD-10-CM | POA: Diagnosis present

## 2018-04-30 DIAGNOSIS — Z79899 Other long term (current) drug therapy: Secondary | ICD-10-CM | POA: Diagnosis not present

## 2018-04-30 DIAGNOSIS — E1169 Type 2 diabetes mellitus with other specified complication: Secondary | ICD-10-CM | POA: Insufficient documentation

## 2018-04-30 DIAGNOSIS — E785 Hyperlipidemia, unspecified: Secondary | ICD-10-CM | POA: Diagnosis not present

## 2018-04-30 DIAGNOSIS — J449 Chronic obstructive pulmonary disease, unspecified: Secondary | ICD-10-CM | POA: Diagnosis not present

## 2018-04-30 DIAGNOSIS — Z7982 Long term (current) use of aspirin: Secondary | ICD-10-CM | POA: Diagnosis not present

## 2018-04-30 DIAGNOSIS — R21 Rash and other nonspecific skin eruption: Secondary | ICD-10-CM

## 2018-04-30 DIAGNOSIS — L309 Dermatitis, unspecified: Secondary | ICD-10-CM | POA: Diagnosis not present

## 2018-04-30 DIAGNOSIS — Z7984 Long term (current) use of oral hypoglycemic drugs: Secondary | ICD-10-CM | POA: Insufficient documentation

## 2018-04-30 DIAGNOSIS — E119 Type 2 diabetes mellitus without complications: Secondary | ICD-10-CM

## 2018-04-30 DIAGNOSIS — Z7951 Long term (current) use of inhaled steroids: Secondary | ICD-10-CM | POA: Diagnosis not present

## 2018-04-30 DIAGNOSIS — K219 Gastro-esophageal reflux disease without esophagitis: Secondary | ICD-10-CM | POA: Insufficient documentation

## 2018-04-30 LAB — POCT GLYCOSYLATED HEMOGLOBIN (HGB A1C): HbA1c, POC (controlled diabetic range): 6.8 % (ref 0.0–7.0)

## 2018-04-30 LAB — GLUCOSE, POCT (MANUAL RESULT ENTRY): POC Glucose: 149 mg/dL — AB (ref 70–99)

## 2018-04-30 MED ORDER — ALBUTEROL SULFATE HFA 108 (90 BASE) MCG/ACT IN AERS: 2.0000 | INHALATION_SPRAY | Freq: Four times a day (QID) | RESPIRATORY_TRACT | 6 refills | Status: DC | PRN

## 2018-04-30 MED ORDER — ATORVASTATIN CALCIUM 80 MG PO TABS: 80.0000 mg | ORAL_TABLET | Freq: Every day | ORAL | 6 refills | Status: DC

## 2018-04-30 MED ORDER — METFORMIN HCL ER 500 MG PO TB24: 500.0000 mg | ORAL_TABLET | Freq: Two times a day (BID) | ORAL | 6 refills | Status: DC

## 2018-04-30 MED ORDER — GABAPENTIN 300 MG PO CAPS: 300.0000 mg | ORAL_CAPSULE | Freq: Every day | ORAL | 6 refills | Status: DC

## 2018-04-30 MED ORDER — FLUTICASONE-SALMETEROL 250-50 MCG/DOSE IN AEPB: 1.0000 | INHALATION_SPRAY | Freq: Two times a day (BID) | RESPIRATORY_TRACT | 6 refills | Status: DC

## 2018-04-30 MED ORDER — TIOTROPIUM BROMIDE MONOHYDRATE 18 MCG IN CAPS: 18.0000 ug | ORAL_CAPSULE | Freq: Every day | RESPIRATORY_TRACT | 6 refills | Status: DC

## 2018-04-30 MED ORDER — TRIAMCINOLONE ACETONIDE 0.1 % EX CREA: 1.0000 "application " | TOPICAL_CREAM | Freq: Two times a day (BID) | CUTANEOUS | 1 refills | Status: DC

## 2018-04-30 MED ORDER — PREDNISONE 20 MG PO TABS: 20.0000 mg | ORAL_TABLET | Freq: Two times a day (BID) | ORAL | 0 refills | Status: DC

## 2018-04-30 MED FILL — PROAIR HFA 90 MCG INHALER: 108 (90 BAS | 25 days supply | Qty: 9 | Fill #0

## 2018-04-30 MED FILL — predniSONE 20 MG TABS: 20 | 5 days supply | Qty: 10 | Fill #0

## 2018-04-30 MED FILL — ADVAIR 250/50 DISKUS: 250-50 | 30 days supply | Qty: 60 | Fill #0

## 2018-04-30 MED FILL — TRIAMCINOLONE ACETONIDE 0.1: 0.1 | 20 days supply | Qty: 45 | Fill #0

## 2018-04-30 MED FILL — SPIRIVA 18 MCG CP-HANDIHALE: 18 | 30 days supply | Qty: 30 | Fill #0

## 2018-04-30 NOTE — Progress Notes (Signed)
Patient needs medication refill.  Patient os having itching in groin area.

## 2018-04-30 NOTE — Patient Instructions (Signed)
Diabetes Mellitus and Nutrition, Adult  When you have diabetes (diabetes mellitus), it is very important to have healthy eating habits because your blood sugar (glucose) levels are greatly affected by what you eat and drink. Eating healthy foods in the appropriate amounts, at about the same times every day, can help you:  · Control your blood glucose.  · Lower your risk of heart disease.  · Improve your blood pressure.  · Reach or maintain a healthy weight.  Every person with diabetes is different, and each person has different needs for a meal plan. Your health care provider may recommend that you work with a diet and nutrition specialist (dietitian) to make a meal plan that is best for you. Your meal plan may vary depending on factors such as:  · The calories you need.  · The medicines you take.  · Your weight.  · Your blood glucose, blood pressure, and cholesterol levels.  · Your activity level.  · Other health conditions you have, such as heart or kidney disease.  How do carbohydrates affect me?  Carbohydrates, also called carbs, affect your blood glucose level more than any other type of food. Eating carbs naturally raises the amount of glucose in your blood. Carb counting is a method for keeping track of how many carbs you eat. Counting carbs is important to keep your blood glucose at a healthy level, especially if you use insulin or take certain oral diabetes medicines.  It is important to know how many carbs you can safely have in each meal. This is different for every person. Your dietitian can help you calculate how many carbs you should have at each meal and for each snack.  Foods that contain carbs include:  · Bread, cereal, rice, pasta, and crackers.  · Potatoes and corn.  · Peas, beans, and lentils.  · Milk and yogurt.  · Fruit and juice.  · Desserts, such as cakes, cookies, ice cream, and candy.  How does alcohol affect me?  Alcohol can cause a sudden decrease in blood glucose (hypoglycemia),  especially if you use insulin or take certain oral diabetes medicines. Hypoglycemia can be a life-threatening condition. Symptoms of hypoglycemia (sleepiness, dizziness, and confusion) are similar to symptoms of having too much alcohol.  If your health care provider says that alcohol is safe for you, follow these guidelines:  · Limit alcohol intake to no more than 1 drink per day for nonpregnant women and 2 drinks per day for men. One drink equals 12 oz of beer, 5 oz of wine, or 1½ oz of hard liquor.  · Do not drink on an empty stomach.  · Keep yourself hydrated with water, diet soda, or unsweetened iced tea.  · Keep in mind that regular soda, juice, and other mixers may contain a lot of sugar and must be counted as carbs.  What are tips for following this plan?    Reading food labels  · Start by checking the serving size on the "Nutrition Facts" label of packaged foods and drinks. The amount of calories, carbs, fats, and other nutrients listed on the label is based on one serving of the item. Many items contain more than one serving per package.  · Check the total grams (g) of carbs in one serving. You can calculate the number of servings of carbs in one serving by dividing the total carbs by 15. For example, if a food has 30 g of total carbs, it would be equal to 2   servings of carbs.  · Check the number of grams (g) of saturated and trans fats in one serving. Choose foods that have low or no amount of these fats.  · Check the number of milligrams (mg) of salt (sodium) in one serving. Most people should limit total sodium intake to less than 2,300 mg per day.  · Always check the nutrition information of foods labeled as "low-fat" or "nonfat". These foods may be higher in added sugar or refined carbs and should be avoided.  · Talk to your dietitian to identify your daily goals for nutrients listed on the label.  Shopping  · Avoid buying canned, premade, or processed foods. These foods tend to be high in fat, sodium,  and added sugar.  · Shop around the outside edge of the grocery store. This includes fresh fruits and vegetables, bulk grains, fresh meats, and fresh dairy.  Cooking  · Use low-heat cooking methods, such as baking, instead of high-heat cooking methods like deep frying.  · Cook using healthy oils, such as olive, canola, or sunflower oil.  · Avoid cooking with butter, cream, or high-fat meats.  Meal planning  · Eat meals and snacks regularly, preferably at the same times every day. Avoid going long periods of time without eating.  · Eat foods high in fiber, such as fresh fruits, vegetables, beans, and whole grains. Talk to your dietitian about how many servings of carbs you can eat at each meal.  · Eat 4-6 ounces (oz) of lean protein each day, such as lean meat, chicken, fish, eggs, or tofu. One oz of lean protein is equal to:  ? 1 oz of meat, chicken, or fish.  ? 1 egg.  ? ¼ cup of tofu.  · Eat some foods each day that contain healthy fats, such as avocado, nuts, seeds, and fish.  Lifestyle  · Check your blood glucose regularly.  · Exercise regularly as told by your health care provider. This may include:  ? 150 minutes of moderate-intensity or vigorous-intensity exercise each week. This could be brisk walking, biking, or water aerobics.  ? Stretching and doing strength exercises, such as yoga or weightlifting, at least 2 times a week.  · Take medicines as told by your health care provider.  · Do not use any products that contain nicotine or tobacco, such as cigarettes and e-cigarettes. If you need help quitting, ask your health care provider.  · Work with a counselor or diabetes educator to identify strategies to manage stress and any emotional and social challenges.  Questions to ask a health care provider  · Do I need to meet with a diabetes educator?  · Do I need to meet with a dietitian?  · What number can I call if I have questions?  · When are the best times to check my blood glucose?  Where to find more  information:  · American Diabetes Association: diabetes.org  · Academy of Nutrition and Dietetics: www.eatright.org  · National Institute of Diabetes and Digestive and Kidney Diseases (NIH): www.niddk.nih.gov  Summary  · A healthy meal plan will help you control your blood glucose and maintain a healthy lifestyle.  · Working with a diet and nutrition specialist (dietitian) can help you make a meal plan that is best for you.  · Keep in mind that carbohydrates (carbs) and alcohol have immediate effects on your blood glucose levels. It is important to count carbs and to use alcohol carefully.  This information is not intended to   replace advice given to you by your health care provider. Make sure you discuss any questions you have with your health care provider.  Document Released: 12/02/2004 Document Revised: 10/05/2016 Document Reviewed: 04/11/2016  [employer] Interactive Patient Education © 2019 [employer]

## 2018-04-30 NOTE — Progress Notes (Signed)
Subjective:  Patient ID: David Barber, male    DOB: 1950-06-25  Age: 68 y.o. MRN: [id number]  CC: Diabetes   HPI David Barber is a 68 year old male with a history of COPD, type 2 diabetes mellitus (A1c 6.8), hyperlipidemia COPD who presents today for follow-up visit seen with the aid of a Nepali video interpreter. He has been out of his inhalers and complains of shortness of breath, wheezing, cough which has been nonproductive.  Denies presence of fever He also complains of a pruritic rash on both thighs and inner aspect of right upper arm which has been present for the last 1 month.  Denies presence of rash in other body parts on denies introduction of new soaps or creams.  He shares the bed with his wife and she denies presence of rash or pruritus.  Compliant with his diabetic medication and statin and denies hypoglycemia.  Neuropathy is controlled on his current regimen.  Denies visual concerns.  Past Medical History:  Diagnosis Date  . Bronchitis   . COPD (chronic obstructive pulmonary disease) ([place])   . GERD (gastroesophageal reflux disease)     Past Surgical History:  Procedure Laterality Date  . CYST REMOVAL NECK  2013   benign    Family History  Problem Relation Age of Onset  . Colon cancer Neg Hx     No Known Allergies  Outpatient Medications Prior to Visit  Medication Sig Dispense Refill  . aspirin EC 81 MG tablet Take 1 tablet (81 mg total) by mouth daily. 30 tablet 5  . cetirizine (ZYRTEC) 10 MG tablet Take 1 tablet (10 mg total) by mouth daily. 30 tablet 11  . ergocalciferol (DRISDOL) 50000 units capsule Take 1 capsule (50,000 Units total) by mouth once a week. 9 capsule 1  . methocarbamol (ROBAXIN) 500 MG tablet Take 1 tablet (500 mg total) by mouth 2 (two) times daily as needed for muscle spasms. 60 tablet 1  . albuterol (PROVENTIL HFA;VENTOLIN HFA) 108 (90 Base) MCG/ACT inhaler Inhale 2 puffs into the lungs every 6 (six) hours as needed for  wheezing or shortness of breath. 1 Inhaler 6  . atorvastatin (LIPITOR) 80 MG tablet Take 1 tablet (80 mg total) by mouth daily. 30 tablet 6  . Fluticasone-Salmeterol (ADVAIR DISKUS) 250-50 MCG/DOSE AEPB Inhale 1 puff into the lungs 2 (two) times daily. 1 each 3  . gabapentin (NEURONTIN) 300 MG capsule Take 1 capsule (300 mg total) by mouth at bedtime. 30 capsule 3  . metFORMIN (GLUCOPHAGE XR) 500 MG 24 hr tablet Take 1 tablet (500 mg total) by mouth 2 (two) times daily. 60 tablet 6  . tiotropium (SPIRIVA HANDIHALER) 18 MCG inhalation capsule Place 1 capsule (18 mcg total) into inhaler and inhale daily. 30 capsule 6  . hydrocortisone cream 0.5 % Apply 1 application topically 2 (two) times daily. (Patient not taking: Reported on 01/18/2018) 66 g 0  . ketotifen (ZADITOR) 0.025 % ophthalmic solution Place 1 drop into both eyes 2 (two) times daily. (Patient not taking: Reported on 04/18/2017) 5 mL 1   No facility-administered medications prior to visit.      ROS Review of Systems  Constitutional: Negative for activity change and appetite change.  HENT: Negative for sinus pressure and sore throat.   Eyes: Negative for visual disturbance.  Respiratory: Positive for cough, shortness of breath and wheezing. Negative for chest tightness.   Cardiovascular: Negative for chest pain and leg swelling.  Gastrointestinal: Negative for abdominal distention, abdominal pain,  constipation and diarrhea.  Endocrine: Negative.   Genitourinary: Negative for dysuria.  Musculoskeletal: Negative for joint swelling and myalgias.  Skin: Negative for rash.  Allergic/Immunologic: Negative.   Neurological: Negative for weakness, light-headedness and numbness.  Psychiatric/Behavioral: Negative for dysphoric mood and suicidal ideas.    Objective:  BP 117/82   Pulse (!) 114   Temp 97.8 F (36.6 C) (Oral)   Ht _0  (1.651 m)   Wt 183 lb (83 kg)   SpO2 (!) 88%   BMI 30.45 kg/m   BP/Weight 04/30/2018 01/22/2018  46/56/8127  Systolic BP 517 001 749  Diastolic BP 82 80 74  Wt. (Lbs) 183 179 176  BMI 30.45 29.79 29.29      Physical Exam Constitutional:      Appearance: He is well-developed.  Cardiovascular:     Rate and Rhythm: Normal rate.     Heart sounds: Normal heart sounds. No murmur.  Pulmonary:     Effort: Pulmonary effort is normal.     Breath sounds: Wheezing present. No rales.  Chest:     Chest wall: No tenderness.  Abdominal:     General: Bowel sounds are normal. There is no distension.     Palpations: Abdomen is soft. There is no mass.     Tenderness: There is no abdominal tenderness.  Musculoskeletal: Normal range of motion.  Skin:    Comments: Dry scaly rash on bilateral anterior thigh and medial aspect of right upper arm  Neurological:     Mental Status: He is alert and oriented to person, place, and time.      CMP Latest Ref Rng & Units 01/22/2018 04/18/2017 10/07/2016  Glucose 65 - 99 mg/dL 101(H) 98 98  BUN 8 - 27 mg/dL 12 9 7(L)  Creatinine 0.76 - 1.27 mg/dL 0.77 0.81 0.86  Sodium 134 - 144 mmol/L 142 142 142  Potassium 3.5 - 5.2 mmol/L 3.9 4.5 4.3  Chloride 96 - 106 mmol/L 101 102 102  CO2 20 - 29 mmol/L _1 Calcium 8.6 - 10.2 mg/dL 9.9 9.2 9.5  Total Protein 6.0 - 8.5 g/dL - 7.6 7.7  Total Bilirubin 0.0 - 1.2 mg/dL - 0.4 0.6  Alkaline Phos 39 - 117 IU/L - 141(H) 125(H)  AST 0 - 40 IU/L - 24 33  ALT 0 - 44 IU/L - 26 32    Lipid Panel     Component Value Date/Time   CHOL 167 04/18/2017 0931   TRIG 269 (H) 04/18/2017 0931   HDL 34 (L) 04/18/2017 0931   CHOLHDL 4.9 04/18/2017 0931   CHOLHDL 6.5 (H) 07/21/2015 1625   VLDL 61 (H) 07/21/2015 1625   LDLCALC 79 04/18/2017 0931    CBC    Component Value Date/Time   WBC 8.8 02/11/2015 1231   RBC 5.77 02/11/2015 1231   HGB 16.3 02/11/2015 1231   HCT 49.4 02/11/2015 1231   PLT 262 02/11/2015 1231   MCV 85.6 02/11/2015 1231   MCH 28.2 02/11/2015 1231   MCHC 33.0 02/11/2015 1231   RDW 14.6  02/11/2015 1231   LYMPHSABS 2.3 06/16/2014 1214   MONOABS 0.6 06/16/2014 1214   EOSABS 0.6 06/16/2014 1214   BASOSABS 0.1 06/16/2014 1214    Lab Results  Component Value Date   HGBA1C 6.8 04/30/2018    Assessment & Plan:   1. Controlled type 2 diabetes mellitus without complication, without long-term current use of insulin (HCC) Controlled with A1c of 6.8 - POCT glucose (manual entry) -  POCT glycosylated hemoglobin (Hb A1C) - metFORMIN (GLUCOPHAGE XR) 500 MG 24 hr tablet; Take 1 tablet (500 mg total) by mouth 2 (two) times daily.  Dispense: 60 tablet; Refill: 6 - Microalbumin / creatinine urine ratio; Future - CMP14+EGFR; Future - Lipid panel; Future  2. COPD, mild ([place]) Uncontrolled due to running out of medications Short course of prednisone - albuterol (PROVENTIL HFA;VENTOLIN HFA) 108 (90 Base) MCG/ACT inhaler; Inhale 2 puffs into the lungs every 6 (six) hours as needed for wheezing or shortness of breath.  Dispense: 1 Inhaler; Refill: 6 - tiotropium (SPIRIVA HANDIHALER) 18 MCG inhalation capsule; Place 1 capsule (18 mcg total) into inhaler and inhale daily.  Dispense: 30 capsule; Refill: 6 - Fluticasone-Salmeterol (ADVAIR DISKUS) 250-50 MCG/DOSE AEPB; Inhale 1 puff into the lungs 2 (two) times daily.  Dispense: 1 each; Refill: 6  3. Other diabetic neurological complication associated with type 2 diabetes mellitus (HCC) Stable - gabapentin (NEURONTIN) 300 MG capsule; Take 1 capsule (300 mg total) by mouth at bedtime.  Dispense: 30 capsule; Refill: 6  4. Hyperlipidemia associated with type 2 diabetes mellitus (HCC) Controlled Low-cholesterol diet - atorvastatin (LIPITOR) 80 MG tablet; Take 1 tablet (80 mg total) by mouth daily.  Dispense: 30 tablet; Refill: 6  5. Rash Dermatitis Placed on topical steroid   Meds ordered this encounter  Medications  . albuterol (PROVENTIL HFA;VENTOLIN HFA) 108 (90 Base) MCG/ACT inhaler    Sig: Inhale 2 puffs into the lungs every 6 (six)  hours as needed for wheezing or shortness of breath.    Dispense:  1 Inhaler    Refill:  6  . tiotropium (SPIRIVA HANDIHALER) 18 MCG inhalation capsule    Sig: Place 1 capsule (18 mcg total) into inhaler and inhale daily.    Dispense:  30 capsule    Refill:  6  . metFORMIN (GLUCOPHAGE XR) 500 MG 24 hr tablet    Sig: Take 1 tablet (500 mg total) by mouth 2 (two) times daily.    Dispense:  60 tablet    Refill:  6    Discontinue previous dose  . gabapentin (NEURONTIN) 300 MG capsule    Sig: Take 1 capsule (300 mg total) by mouth at bedtime.    Dispense:  30 capsule    Refill:  6  . Fluticasone-Salmeterol (ADVAIR DISKUS) 250-50 MCG/DOSE AEPB    Sig: Inhale 1 puff into the lungs 2 (two) times daily.    Dispense:  1 each    Refill:  6  . atorvastatin (LIPITOR) 80 MG tablet    Sig: Take 1 tablet (80 mg total) by mouth daily.    Dispense:  30 tablet    Refill:  6  . triamcinolone cream (KENALOG) 0.1 %    Sig: Apply 1 application topically 2 (two) times daily.    Dispense:  45 g    Refill:  1  . predniSONE (DELTASONE) 20 MG tablet    Sig: Take 1 tablet (20 mg total) by mouth 2 (two) times daily with a meal.    Dispense:  10 tablet    Refill:  0    Follow-up: Return in about 6 months (around 10/29/2018) for Follow-up of chronic medical conditions.       Charlott Rakes, MD, FAAFP. [facility] and [place], [place]   04/30/2018, 2:23 PM

## 2018-05-02 ENCOUNTER — Ambulatory Visit: Payer: Medicaid Other | Attending: Family Medicine | Admitting: Pharmacist

## 2018-05-02 ENCOUNTER — Encounter: Payer: Self-pay | Admitting: Pharmacist

## 2018-05-02 VITALS — BP 106/72 | HR 105

## 2018-05-02 DIAGNOSIS — R030 Elevated blood-pressure reading, without diagnosis of hypertension: Secondary | ICD-10-CM | POA: Diagnosis not present

## 2018-05-02 DIAGNOSIS — Z0130 Encounter for examination of blood pressure without abnormal findings: Secondary | ICD-10-CM

## 2018-05-02 DIAGNOSIS — E119 Type 2 diabetes mellitus without complications: Secondary | ICD-10-CM

## 2018-05-02 NOTE — Progress Notes (Signed)
   S:    PCP: Dr. Margarita Rana   Patient arrives in good spirits. He reports that he was instructed to present to me for a BP check but I cannot find this in his charts. Future lab orders are listed from his PCP - perhaps he was confused. However, will check BP today and forward to PCP.   Patient not currently taking medications for BP control.  Dietary habits include: does not limit salt, drinks coffee daily Exercise habits include: limited mobility  Family / Social history: no pertinent positives in CHL, chewing tobacco, occasional alcohol   Home BP readings: does not take at home  O:  L arm: 106/72, HR 105  Last 3 Office BP readings: BP Readings from Last 3 Encounters:  05/02/18 106/72  04/30/18 117/82  01/22/18 137/80   BMET    Component Value Date/Time   NA 142 01/22/2018 1008   K 3.9 01/22/2018 1008   CL 101 01/22/2018 1008   CO2 27 01/22/2018 1008   GLUCOSE 101 (H) 01/22/2018 1008   GLUCOSE 111 (H) 07/21/2015 1625   BUN 12 01/22/2018 1008   CREATININE 0.77 01/22/2018 1008   CREATININE 0.79 07/21/2015 1625   CALCIUM 9.9 01/22/2018 1008   GFRNONAA 93 01/22/2018 1008   GFRNONAA >89 07/21/2015 1625   GFRAA 108 01/22/2018 1008   GFRAA >89 07/21/2015 1625    Renal function: CrCl cannot be calculated (Patient's most recent lab result is older than the maximum 21 days allowed.).  Clinical ASCVD: No  The ASCVD Risk score Mikey Bussing DC Jr., et al., 2013) failed to calculate for the following reasons:   Unable to determine if patient is Non-Hispanic African American  A/P: Hypertension Undiagnosed. His blood pressure is normal and he is not on anti-hypertensive medications. Will forward results to PCP. Will direct patient to the lab to complete orders left by PCP.   - Lipid, CMP14 +GFR, Microalbumin per PCP -Counseled on lifestyle modifications for blood pressure control including reduced dietary sodium, increased exercise, adequate sleep  Results reviewed and written  information provided. Total time in face-to-face counseling 15 minutes.   Follow-up with PCP as directed.  Patient seen with: Beckey Rutter, PharmD Candidate  [place] [facility]  Class of 2022  Benard Halsted, PharmD, [place] [phone]

## 2018-05-03 ENCOUNTER — Ambulatory Visit: Payer: Medicaid Other | Attending: Family Medicine | Admitting: Pharmacist

## 2018-05-03 ENCOUNTER — Encounter: Payer: Self-pay | Admitting: Pharmacist

## 2018-05-03 VITALS — BP 112/75 | HR 109

## 2018-05-03 DIAGNOSIS — Z0130 Encounter for examination of blood pressure without abnormal findings: Secondary | ICD-10-CM

## 2018-05-03 DIAGNOSIS — R030 Elevated blood-pressure reading, without diagnosis of hypertension: Secondary | ICD-10-CM | POA: Insufficient documentation

## 2018-05-03 LAB — CMP14+EGFR
A/G RATIO: 1.5 (ref 1.2–2.2)
ALT: 19 IU/L (ref 0–44)
AST: 18 IU/L (ref 0–40)
Albumin: 4.7 g/dL (ref 3.8–4.8)
Alkaline Phosphatase: 129 IU/L — ABNORMAL HIGH (ref 39–117)
BUN/Creatinine Ratio: 9 — ABNORMAL LOW (ref 10–24)
BUN: 8 mg/dL (ref 8–27)
Bilirubin Total: 0.3 mg/dL (ref 0.0–1.2)
CALCIUM: 9.9 mg/dL (ref 8.6–10.2)
CO2: 17 mmol/L — AB (ref 20–29)
CREATININE: 0.86 mg/dL (ref 0.76–1.27)
Chloride: 104 mmol/L (ref 96–106)
GFR, EST AFRICAN AMERICAN: 102 mL/min/{1.73_m2} (ref >59)
GFR, EST NON AFRICAN AMERICAN: 88 mL/min/{1.73_m2} (ref >59)
GLUCOSE: 146 mg/dL — AB (ref 65–99)
Globulin, Total: 3.1 g/dL (ref 1.5–4.5)
Potassium: 4.9 mmol/L (ref 3.5–5.2)
Sodium: 142 mmol/L (ref 134–144)
TOTAL PROTEIN: 7.8 g/dL (ref 6.0–8.5)

## 2018-05-03 LAB — LIPID PANEL
CHOL/HDL RATIO: 3.7 ratio (ref 0.0–5.0)
Cholesterol, Total: 166 mg/dL (ref 100–199)
HDL: 45 mg/dL (ref >39)
LDL Calculated: 100 mg/dL — ABNORMAL HIGH (ref 0–99)
TRIGLYCERIDES: 107 mg/dL (ref 0–149)
VLDL Cholesterol Cal: 21 mg/dL (ref 5–40)

## 2018-05-03 LAB — MICROALBUMIN / CREATININE URINE RATIO
Creatinine, Urine: 70.6 mg/dL
MICROALB/CREAT RATIO: 25 mg/g{creat} (ref 0–29)
Microalbumin, Urine: 17.7 ug/mL

## 2018-05-03 NOTE — Progress Notes (Signed)
   S:    PCP: Dr. Margarita Rana   Patient arrives in good spirits. He reports that he was instructed to present to me for a BP check by his home health nurse. Nurse told him that his BP was elevated but he doesn't remember the number. There are no telephone encounters documented for validation of this. However, will check BP today and forward to PCP.   Patient not currently taking medications for BP control. Denies chest pain, blurred vision, HA. Endorses dyspnea upon exertion but according to him, this is baseline.   Dietary habits include: does not limit salt, drinks coffee daily Exercise habits include: limited mobility  Family / Social history: no pertinent positives in CHL, chewing tobacco, occasional alcohol   Home BP readings:  - Home health nurse checks his BP - No values given   O:  L arm: 112/75, HR 109  Last 3 Office BP readings: BP Readings from Last 3 Encounters:  05/02/18 106/72  04/30/18 117/82  01/22/18 137/80   BMET    Component Value Date/Time   NA 142 05/02/2018 1028   K 4.9 05/02/2018 1028   CL 104 05/02/2018 1028   CO2 17 (L) 05/02/2018 1028   GLUCOSE 146 (H) 05/02/2018 1028   GLUCOSE 111 (H) 07/21/2015 1625   BUN 8 05/02/2018 1028   CREATININE 0.86 05/02/2018 1028   CREATININE 0.79 07/21/2015 1625   CALCIUM 9.9 05/02/2018 1028   GFRNONAA 88 05/02/2018 1028   GFRNONAA >89 07/21/2015 1625   GFRAA 102 05/02/2018 1028   GFRAA >89 07/21/2015 1625   Renal function: Estimated Creatinine Clearance: 80.4 mL/min (by C-G formula based on SCr of 0.86 mg/dL).  Clinical ASCVD: No  The ASCVD Risk score Mikey Bussing DC Jr., et al., 2013) failed to calculate for the following reasons:   Unable to determine if patient is Non-Hispanic African American  A/P: Hypertension Undiagnosed. His blood pressure is normal and he is not on anti-hypertensive medications. Will forward results to PCP.  -Counseled on lifestyle modifications for blood pressure control including reduced  dietary sodium, increased exercise, adequate sleep  Results reviewed and written information provided. Total time in face-to-face counseling 15 minutes.   Follow-up with PCP as directed.  Patient seen with: Beckey Rutter, PharmD Candidate  [place] [facility]  Class of 2022  Benard Halsted, PharmD, [place] [phone]

## 2018-05-22 ENCOUNTER — Ambulatory Visit: Payer: Medicaid Other | Attending: Family Medicine | Admitting: Family Medicine

## 2018-05-22 ENCOUNTER — Encounter: Payer: Self-pay | Admitting: Family Medicine

## 2018-05-22 ENCOUNTER — Other Ambulatory Visit: Payer: Self-pay

## 2018-05-22 VITALS — BP 115/74 | HR 107 | Temp 97.4°F | Ht 65.0 in | Wt 181.4 lb

## 2018-05-22 DIAGNOSIS — Z79899 Other long term (current) drug therapy: Secondary | ICD-10-CM | POA: Diagnosis not present

## 2018-05-22 DIAGNOSIS — J449 Chronic obstructive pulmonary disease, unspecified: Secondary | ICD-10-CM | POA: Diagnosis not present

## 2018-05-22 DIAGNOSIS — I10 Essential (primary) hypertension: Secondary | ICD-10-CM | POA: Diagnosis present

## 2018-05-22 DIAGNOSIS — K219 Gastro-esophageal reflux disease without esophagitis: Secondary | ICD-10-CM | POA: Diagnosis not present

## 2018-05-22 DIAGNOSIS — F17220 Nicotine dependence, chewing tobacco, uncomplicated: Secondary | ICD-10-CM | POA: Insufficient documentation

## 2018-05-22 DIAGNOSIS — Z720 Tobacco use: Secondary | ICD-10-CM

## 2018-05-22 DIAGNOSIS — Z7982 Long term (current) use of aspirin: Secondary | ICD-10-CM | POA: Diagnosis not present

## 2018-05-22 DIAGNOSIS — E119 Type 2 diabetes mellitus without complications: Secondary | ICD-10-CM | POA: Diagnosis not present

## 2018-05-22 DIAGNOSIS — E785 Hyperlipidemia, unspecified: Secondary | ICD-10-CM | POA: Insufficient documentation

## 2018-05-22 DIAGNOSIS — Z7984 Long term (current) use of oral hypoglycemic drugs: Secondary | ICD-10-CM | POA: Diagnosis not present

## 2018-05-22 LAB — GLUCOSE, POCT (MANUAL RESULT ENTRY): POC Glucose: 117 mg/dl — AB (ref 70–99)

## 2018-05-22 MED ORDER — BUPROPION HCL ER (SR) 150 MG PO TB12: 150.0000 mg | ORAL_TABLET | Freq: Two times a day (BID) | ORAL | 3 refills | Status: DC

## 2018-05-22 MED FILL — ATORVASTATIN 80 MG TABLET: 80 | 30 days supply | Qty: 30 | Fill #3

## 2018-05-22 MED FILL — CETIRIZINE HCL 10 MG TABS: 10 | 30 days supply | Qty: 30 | Fill #3

## 2018-05-22 MED FILL — BUPROPION SR 150 MG TABLET: 150 | 30 days supply | Qty: 60 | Fill #0

## 2018-05-22 MED FILL — VENTOLIN HFA 90 MCG INHALER: 108 (90 BAS | 25 days supply | Qty: 18 | Fill #1

## 2018-05-22 MED FILL — GABAPENTIN 300 MG CAPSULE: 300 | 30 days supply | Qty: 30 | Fill #3

## 2018-05-22 MED FILL — TRIAMCINOLONE ACETONIDE 0.1: 0.1 | 20 days supply | Qty: 45 | Fill #1

## 2018-05-22 MED FILL — METFORMIN HCL ER 500 MG TAB: 500 | 30 days supply | Qty: 60 | Fill #3

## 2018-05-22 NOTE — Progress Notes (Signed)
Subjective:  Patient ID: David Barber, male    DOB: May 17, 1950  Age: 68 y.o. MRN: [id number]  CC: Hypertension   HPI David Barber  is a 68 year old male with a history of COPD, type 2 diabetes mellitus (A1c 6.8), hyperlipidemia COPD who presents today for a follow-up visit seen with the aid of a Nepali video interpreter. Review of his chart indicates he did have an elevated blood pressure when measured at home by a home nurse however today his blood pressure is normal and was also normal at his visit with the clinical pharmacist last month and he currently is not on any antihypertensive. He is interested in nicotine replacement therapies as he has smoked cigarettes for close to 40 years-half a pack of cigarette per day and quit 3 years ago but only to commence chewing tobacco. He has no additional concerns today and his respiratory symptoms and dyspnea which he had at his last office visit have resolved.  Past Medical History:  Diagnosis Date  . Bronchitis   . COPD (chronic obstructive pulmonary disease) ([place])   . GERD (gastroesophageal reflux disease)     Past Surgical History:  Procedure Laterality Date  . CYST REMOVAL NECK  2013   benign    Family History  Problem Relation Age of Onset  . Colon cancer Neg Hx     No Known Allergies  Outpatient Medications Prior to Visit  Medication Sig Dispense Refill  . albuterol (PROVENTIL HFA;VENTOLIN HFA) 108 (90 Base) MCG/ACT inhaler Inhale 2 puffs into the lungs every 6 (six) hours as needed for wheezing or shortness of breath. 1 Inhaler 6  . aspirin EC 81 MG tablet Take 1 tablet (81 mg total) by mouth daily. 30 tablet 5  . atorvastatin (LIPITOR) 80 MG tablet Take 1 tablet (80 mg total) by mouth daily. 30 tablet 6  . cetirizine (ZYRTEC) 10 MG tablet Take 1 tablet (10 mg total) by mouth daily. 30 tablet 11  . ergocalciferol (DRISDOL) 50000 units capsule Take 1 capsule (50,000 Units total) by mouth once a week. 9 capsule 1    . Fluticasone-Salmeterol (ADVAIR DISKUS) 250-50 MCG/DOSE AEPB Inhale 1 puff into the lungs 2 (two) times daily. 1 each 6  . gabapentin (NEURONTIN) 300 MG capsule Take 1 capsule (300 mg total) by mouth at bedtime. 30 capsule 6  . metFORMIN (GLUCOPHAGE XR) 500 MG 24 hr tablet Take 1 tablet (500 mg total) by mouth 2 (two) times daily. 60 tablet 6  . methocarbamol (ROBAXIN) 500 MG tablet Take 1 tablet (500 mg total) by mouth 2 (two) times daily as needed for muscle spasms. 60 tablet 1  . predniSONE (DELTASONE) 20 MG tablet Take 1 tablet (20 mg total) by mouth 2 (two) times daily with a meal. 10 tablet 0  . tiotropium (SPIRIVA HANDIHALER) 18 MCG inhalation capsule Place 1 capsule (18 mcg total) into inhaler and inhale daily. 30 capsule 6  . triamcinolone cream (KENALOG) 0.1 % Apply 1 application topically 2 (two) times daily. 45 g 1  . hydrocortisone cream 0.5 % Apply 1 application topically 2 (two) times daily. (Patient not taking: Reported on 01/18/2018) 66 g 0  . ketotifen (ZADITOR) 0.025 % ophthalmic solution Place 1 drop into both eyes 2 (two) times daily. (Patient not taking: Reported on 04/18/2017) 5 mL 1   No facility-administered medications prior to visit.      ROS Review of Systems General: negative for fever, weight loss, appetite change Eyes: no visual symptoms. ENT:  no ear symptoms, no sinus tenderness, no nasal congestion or sore throat. Neck: no pain  Respiratory: no wheezing, shortness of breath, cough Cardiovascular: no chest pain, no dyspnea on exertion, no pedal edema, no orthopnea. Gastrointestinal: no abdominal pain, no diarrhea, no constipation Genito-Urinary: no urinary frequency, no dysuria, no polyuria. Hematologic: no bruising Endocrine: no cold or heat intolerance Neurological: no headaches, no seizures, no tremors Musculoskeletal: no joint pains, no joint swelling Skin: no pruritus, no rash. Psychological: no depression, no anxiety,    Objective:  BP 115/74    Pulse (!) 107   Temp (!) 97.4 F (36.3 C) (Oral)   Ht 5\' 5"  (1.651 m)   Wt 181 lb 6.4 oz (82.3 kg)   SpO2 92%   BMI 30.19 kg/m   BP/Weight 05/22/2018 05/03/2018 2/42/6834  Systolic BP 196 222 979  Diastolic BP 74 75 72  Wt. (Lbs) 181.4 - -  BMI 30.19 - -      Physical Exam Constitutional: normal appearing,  Eyes: PERRLA HEENT: Head is atraumatic, normal sinuses, normal oropharynx, normal appearing tonsils and palate, tympanic membrane is normal bilaterally. Neck: normal range of motion, no thyromegaly, no JVD Cardiovascular: tachycardic rate and rhythm, normal heart sounds, no murmurs, rub or gallop, no pedal edema Respiratory: Normal breath sounds, clear to auscultation bilaterally, no wheezes, no rales, no rhonchi Abdomen: soft, not tender to palpation, normal bowel sounds, no enlarged organs Musculoskeletal: Full ROM, no tenderness in joints Skin: warm and dry, no lesions. Neurological: alert, oriented x3, cranial nerves I-XII grossly intact , normal motor strength, normal sensation. Psychological: normal mood.   CMP Latest Ref Rng & Units 05/02/2018 01/22/2018 04/18/2017  Glucose 65 - 99 mg/dL 146(H) 101(H) 98  BUN 8 - 27 mg/dL 8 12 9   Creatinine 0.76 - 1.27 mg/dL 0.86 0.77 0.81  Sodium 134 - 144 mmol/L 142 142 142  Potassium 3.5 - 5.2 mmol/L 4.9 3.9 4.5  Chloride 96 - 106 mmol/L 104 101 102  CO2 20 - 29 mmol/L 17(L) 27 24  Calcium 8.6 - 10.2 mg/dL 9.9 9.9 9.2  Total Protein 6.0 - 8.5 g/dL 7.8 - 7.6  Total Bilirubin 0.0 - 1.2 mg/dL 0.3 - 0.4  Alkaline Phos 39 - 117 IU/L 129(H) - 141(H)  AST 0 - 40 IU/L 18 - 24  ALT 0 - 44 IU/L 19 - 26    Lipid Panel     Component Value Date/Time   CHOL 166 05/02/2018 1028   TRIG 107 05/02/2018 1028   HDL 45 05/02/2018 1028   CHOLHDL 3.7 05/02/2018 1028   CHOLHDL 6.5 (H) 07/21/2015 1625   VLDL 61 (H) 07/21/2015 1625   LDLCALC 100 (H) 05/02/2018 1028    CBC    Component Value Date/Time   WBC 8.8 02/11/2015 1231   RBC 5.77  02/11/2015 1231   HGB 16.3 02/11/2015 1231   HCT 49.4 02/11/2015 1231   PLT 262 02/11/2015 1231   MCV 85.6 02/11/2015 1231   MCH 28.2 02/11/2015 1231   MCHC 33.0 02/11/2015 1231   RDW 14.6 02/11/2015 1231   LYMPHSABS 2.3 06/16/2014 1214   MONOABS 0.6 06/16/2014 1214   EOSABS 0.6 06/16/2014 1214   BASOSABS 0.1 06/16/2014 1214    Lab Results  Component Value Date   HGBA1C 6.8 04/30/2018    Assessment & Plan:   1. Controlled type 2 diabetes mellitus without complication, without long-term current use of insulin (HCC) Controlled with A1c of 6.8 Continue current management - POCT glucose (manual  entry)  2. Chewing tobacco use Spent 3 minutes counseling on cessation he is willing to work on quitting Lung cancer screening not indicated at this time as he has smoked for 20 pack years. - buPROPion (WELLBUTRIN SR) 150 MG 12 hr tablet; Take 1 tablet (150 mg total) by mouth 2 (two) times daily.  Dispense: 60 tablet; Refill: 3   Meds ordered this encounter  Medications  . buPROPion (WELLBUTRIN SR) 150 MG 12 hr tablet    Sig: Take 1 tablet (150 mg total) by mouth 2 (two) times daily.    Dispense:  60 tablet    Refill:  3    For tobacco cessation    Follow-up: Return for Follow-up of chronic medical conditions, keep previously scheduled appointment.       Charlott Rakes, MD, FAAFP. [facility] and [place], [place]   05/22/2018, 3:01 PM

## 2018-05-22 NOTE — Patient Instructions (Signed)
Tobacco Use Disorder Tobacco use disorder (TUD) occurs when a person craves, seeks, and uses tobacco, regardless of the consequences. This disorder can cause problems with mental and physical health. It can affect your ability to have healthy relationships, and it can keep you from meeting your responsibilities at work, home, or school. Tobacco may be:  Smoked as a cigarette or cigar.  Inhaled using e-cigarettes.  Smoked in a pipe or hookah.  Chewed as smokeless tobacco.  Inhaled into the nostrils as snuff. Tobacco products contain a dangerous chemical called nicotine, which is very addictive. Nicotine triggers hormones that make the body feel stimulated and works on areas of the brain that make you feel good. These effects can make it hard for people to quit nicotine. Tobacco contains many other unsafe chemicals that can damage almost every organ in the body. Smoking tobacco also puts others in danger due to fire risk and possible health problems caused by breathing in secondhand smoke. What are the signs or symptoms? Symptoms of TUD may include:  Being unable to slow down or stop your tobacco use.  Spending an abnormal amount of time getting or using tobacco.  Craving tobacco. Cravings may last for up to 6 months after quitting.  Tobacco use that: ? Interferes with your work, school, or home life. ? Interferes with your personal and social relationships. ? Makes you give up activities that you once enjoyed or found important.  Using tobacco even though you know that it is: ? Dangerous or bad for your health or someone else's health. ? Causing problems in your life.  Needing more and more of the substance to get the same effect (developing tolerance).  Experiencing unpleasant symptoms if you do not use the substance (withdrawal). Withdrawal symptoms may include: ? Depressed, anxious, or irritable mood. ? Difficulty concentrating. ? Increased appetite. ? Restlessness or trouble  sleeping.  Using the substance to avoid withdrawal. How is this diagnosed? This condition may be diagnosed based on:  Your current and past tobacco use. Your health care provider may ask questions about how your tobacco use affects your life.  A physical exam. You may be diagnosed with TUD if you have at least two symptoms within a 12-month period. How is this treated? This condition is treated by stopping tobacco use. Many people are unable to quit on their own and need help. Treatment may include:  Nicotine replacement therapy (NRT). NRT provides nicotine without the other harmful chemicals in tobacco. NRT gradually lowers the dosage of nicotine in the body and reduces withdrawal symptoms. NRT is available as: ? Over-the-counter gums, lozenges, and skin patches. ? Prescription mouth inhalers and nasal sprays.  Medicine that acts on the brain to reduce cravings and withdrawal symptoms.  A type of talk therapy that examines your triggers for tobacco use, how to avoid them, and how to cope with cravings (behavioral therapy).  Hypnosis. This may help with withdrawal symptoms.  Joining a support group for others coping with TUD. The best treatment for TUD is usually a combination of medicine, talk therapy, and support groups. Recovery can be a long process. Many people start using tobacco again after stopping (relapse). If you relapse, it does not mean that treatment will not work. Follow these instructions at home:  Lifestyle  Do not use any products that contain nicotine or tobacco, such as cigarettes and e-cigarettes.  Avoid things that trigger tobacco use as much as you can. Triggers include people and situations that usually cause you   to use tobacco.  Avoid drinks that contain caffeine, including coffee. These may worsen some withdrawal symptoms.  Find ways to manage stress. Wanting to smoke may cause stress, and stress can make you want to smoke. Relaxation techniques such as  deep breathing, meditation, and yoga may help.  Attend support groups as needed. These groups are an important part of long-term recovery for many people. General instructions  Take over-the-counter and prescription medicines only as told by your health care provider.  Check with your health care provider before taking any new prescription or over-the-counter medicines.  Decide on a friend, family member, or smoking quit-line (such as 1-800-QUIT-NOW in the [place] that you can call or text when you feel the urge to smoke or when you need help coping with cravings.  Keep all follow-up visits as told by your health care provider and therapist. This is important. Contact a health care provider if:  You are not able to take your medicines as prescribed.  Your symptoms get worse, even with treatment. Summary  Tobacco use disorder (TUD) occurs when a person craves, seeks, and uses tobacco regardless of the consequences.  This condition may be diagnosed based on your current and past tobacco use and a physical exam.  Many people are unable to quit on their own and need help. Recovery can be a long process.  The most effective treatment for TUD is usually a combination of medicine, talk therapy, and support groups. This information is not intended to replace advice given to you by your health care provider. Make sure you discuss any questions you have with your health care provider. Document Released: 11/11/2003 Document Revised: 02/22/2017 Document Reviewed: 02/22/2017 Elsevier Interactive Patient Education  2019 [employer]  

## 2018-05-25 MED FILL — SPIRIVA 18 MCG CP-HANDIHALE: 18 | 30 days supply | Qty: 30 | Fill #1

## 2018-05-29 MED FILL — ADVAIR 250/50 DISKUS: 250-50 | 30 days supply | Qty: 60 | Fill #1

## 2018-06-20 MED FILL — ATORVASTATIN 80 MG TABLET: 80 | 30 days supply | Qty: 30 | Fill #4

## 2018-06-20 MED FILL — METFORMIN HCL ER 500 MG TAB: 500 | 30 days supply | Qty: 60 | Fill #4

## 2018-06-20 MED FILL — CETIRIZINE HCL 10 MG TABS: 10 | 30 days supply | Qty: 30 | Fill #4

## 2018-06-26 MED FILL — SPIRIVA 18 MCG CP-HANDIHALE: 18 | 30 days supply | Qty: 30 | Fill #2

## 2018-06-26 MED FILL — VENTOLIN HFA 90 MCG INHALER: 108 (90 BAS | 25 days supply | Qty: 18 | Fill #2

## 2018-06-26 MED FILL — ADVAIR 250/50 DISKUS: 250-50 | 30 days supply | Qty: 60 | Fill #2

## 2018-07-26 MED FILL — ATORVASTATIN 80 MG TABLET: 80 | 30 days supply | Qty: 30 | Fill #5

## 2018-07-26 MED FILL — CETIRIZINE HCL 10 MG TABS: 10 | 30 days supply | Qty: 30 | Fill #5

## 2018-07-26 MED FILL — METFORMIN HCL ER 500 MG TAB: 500 | 30 days supply | Qty: 60 | Fill #5

## 2018-08-06 MED FILL — SPIRIVA 18 MCG CP-HANDIHALE: 18 | 30 days supply | Qty: 30 | Fill #3

## 2018-08-06 MED FILL — ADVAIR 250/50 DISKUS: 250-50 | 30 days supply | Qty: 60 | Fill #3

## 2018-08-06 MED FILL — VENTOLIN HFA 90 MCG INHALER: 108 (90 BAS | 25 days supply | Qty: 18 | Fill #3

## 2018-08-16 ENCOUNTER — Ambulatory Visit: Payer: Medicare Other | Attending: Family Medicine | Admitting: Physician Assistant

## 2018-08-16 ENCOUNTER — Other Ambulatory Visit: Payer: Self-pay

## 2018-08-16 VITALS — BP 115/79 | HR 107 | Temp 98.2°F | Ht 65.0 in | Wt 180.0 lb

## 2018-08-16 DIAGNOSIS — H04123 Dry eye syndrome of bilateral lacrimal glands: Secondary | ICD-10-CM | POA: Diagnosis not present

## 2018-08-16 DIAGNOSIS — Z7984 Long term (current) use of oral hypoglycemic drugs: Secondary | ICD-10-CM | POA: Insufficient documentation

## 2018-08-16 DIAGNOSIS — Z79899 Other long term (current) drug therapy: Secondary | ICD-10-CM | POA: Diagnosis not present

## 2018-08-16 DIAGNOSIS — E119 Type 2 diabetes mellitus without complications: Secondary | ICD-10-CM | POA: Insufficient documentation

## 2018-08-16 DIAGNOSIS — Z7982 Long term (current) use of aspirin: Secondary | ICD-10-CM | POA: Insufficient documentation

## 2018-08-16 DIAGNOSIS — J449 Chronic obstructive pulmonary disease, unspecified: Secondary | ICD-10-CM | POA: Insufficient documentation

## 2018-08-16 DIAGNOSIS — R21 Rash and other nonspecific skin eruption: Secondary | ICD-10-CM | POA: Diagnosis not present

## 2018-08-16 DIAGNOSIS — R0982 Postnasal drip: Secondary | ICD-10-CM | POA: Insufficient documentation

## 2018-08-16 DIAGNOSIS — Z789 Other specified health status: Secondary | ICD-10-CM

## 2018-08-16 DIAGNOSIS — R6889 Other general symptoms and signs: Secondary | ICD-10-CM | POA: Diagnosis not present

## 2018-08-16 LAB — GLUCOSE, POCT (MANUAL RESULT ENTRY): POC Glucose: 125 mg/dl — AB (ref 70–99)

## 2018-08-16 MED ORDER — POLYETHYL GLYCOL-PROPYL GLYCOL 0.4-0.3 % OP SOLN: 1.0000 [drp] | OPHTHALMIC | 5 refills | Status: AC

## 2018-08-16 MED ORDER — PREDNISONE 10 MG PO TABS: ORAL_TABLET | ORAL | 0 refills | Status: DC

## 2018-08-16 MED ORDER — CETIRIZINE HCL 10 MG PO TABS: 10.0000 mg | ORAL_TABLET | Freq: Every day | ORAL | 11 refills | Status: DC

## 2018-08-16 MED FILL — predniSONE 10 MG TABS: 10 | 21 days supply | Qty: 21 | Fill #0

## 2018-08-16 NOTE — Progress Notes (Signed)
Patient ID: David Barber, male   DOB: February 10, 1951, 68 y.o.   MRN: [id number]     David Barber, is a 68 y.o. male  DTO:[id number]  XIP:[id number]  DOB - 1950/10/08  Subjective:  Chief Complaint and HPI: David Barber is a 68 y.o. male here today with itching and faint rash.  Itching, esp at night.  Keeps him from sleeping. Started about 3 months ago. Uses hydrocortisone with some relief.  Rash is behind upper arms, across his abdomen, upper buttocks and upper thighs.  No fever.  No tick bites.  No travel.  No one else in home affected.  No rash or itching on fingers/wrists/toes/ankles/anal region.    Translator=[place].  ROS:   Constitutional:  No f/c, No night sweats, No unexplained weight loss. EENT:  No vision changes, No blurry vision, No hearing changes. No mouth, throat, or ear problems.  Respiratory: No cough, No SOB Cardiac: No CP, no palpitations GI:  No abd pain, No N/V/D. GU: No Urinary s/sx Musculoskeletal: No joint pain Neuro: No headache, no dizziness, no motor weakness.  Skin: mild rash Endocrine:  No polydipsia. No polyuria.  Psych: Denies SI/HI  No problems updated.  ALLERGIES: No Known Allergies  PAST MEDICAL HISTORY: Past Medical History:  Diagnosis Date  . Bronchitis   . COPD (chronic obstructive pulmonary disease) ([place])   . GERD (gastroesophageal reflux disease)     MEDICATIONS AT HOME: Prior to Admission medications   Medication Sig Start Date End Date Taking? Authorizing Provider  albuterol (PROVENTIL HFA;VENTOLIN HFA) 108 (90 Base) MCG/ACT inhaler Inhale 2 puffs into the lungs every 6 (six) hours as needed for wheezing or shortness of breath. 04/30/18   Charlott Rakes, MD  aspirin EC 81 MG tablet Take 1 tablet (81 mg total) by mouth daily. 11/15/17   Argentina Donovan, PA-C  atorvastatin (LIPITOR) 80 MG tablet Take 1 tablet (80 mg total) by mouth daily. 04/30/18   Charlott Rakes, MD  buPROPion (WELLBUTRIN SR) 150 MG 12 hr tablet Take 1 tablet (150 mg  total) by mouth 2 (two) times daily. 05/22/18   Charlott Rakes, MD  cetirizine (ZYRTEC) 10 MG tablet Take 1 tablet (10 mg total) by mouth daily. 08/16/18   Argentina Donovan, PA-C  ergocalciferol (DRISDOL) 50000 units capsule Take 1 capsule (50,000 Units total) by mouth once a week. 01/23/18   Charlott Rakes, MD  Fluticasone-Salmeterol (ADVAIR DISKUS) 250-50 MCG/DOSE AEPB Inhale 1 puff into the lungs 2 (two) times daily. 04/30/18   Charlott Rakes, MD  gabapentin (NEURONTIN) 300 MG capsule Take 1 capsule (300 mg total) by mouth at bedtime. 04/30/18   Charlott Rakes, MD  hydrocortisone cream 0.5 % Apply 1 application topically 2 (two) times daily. Patient not taking: Reported on 01/18/2018 07/18/17   Charlott Rakes, MD  ketotifen (ZADITOR) 0.025 % ophthalmic solution Place 1 drop into both eyes 2 (two) times daily. Patient not taking: Reported on 04/18/2017 02/15/17   Argentina Donovan, PA-C  metFORMIN (GLUCOPHAGE XR) 500 MG 24 hr tablet Take 1 tablet (500 mg total) by mouth 2 (two) times daily. 04/30/18   Charlott Rakes, MD  methocarbamol (ROBAXIN) 500 MG tablet Take 1 tablet (500 mg total) by mouth 2 (two) times daily as needed for muscle spasms. 01/22/18   Charlott Rakes, MD  Polyethyl Glycol-Propyl Glycol 0.4-0.3 % SOLN Apply 1 drop to eye 1 day or 1 dose for 1 dose. 08/16/18 08/17/18  Argentina Donovan, PA-C  predniSONE (DELTASONE) 10 MG tablet 6,5,4,3,2,1 take  each days dose all at once in the morning with food. 08/16/18   Argentina Donovan, PA-C  tiotropium (SPIRIVA HANDIHALER) 18 MCG inhalation capsule Place 1 capsule (18 mcg total) into inhaler and inhale daily. 04/30/18   Charlott Rakes, MD  triamcinolone cream (KENALOG) 0.1 % Apply 1 application topically 2 (two) times daily. 04/30/18   Charlott Rakes, MD     Objective:  EXAM:   Vitals:   08/16/18 1131  BP: 115/79  Pulse: (!) 107  Temp: 98.2 F (36.8 C)  TempSrc: Oral  SpO2: 93%  Weight: 180 lb (81.6 kg)  Height: 5\' 5"  (1.651 m)     General appearance : A&OX3. NAD. Non-toxic-appearing HEENT: Atraumatic and Normocephalic.  PERRLA. EOM intact.   Chest/Lungs:  Breathing-non-labored, Good air entry bilaterally, breath sounds normal without rales, rhonchi, or wheezing  CVS: S1 S2 regular, no murmurs, gallops, rubs  Extremities: Bilateral Lower Ext shows no edema, both legs are warm to touch with = pulse throughout Neurology:  CN II-XII grossly intact, Non focal.   Psych:  TP linear. J/I WNL. Normal speech. Appropriate eye contact and affect.  Skin:  Faint exanthem maculopapular with mild erythema in distribution noted above.  Data Review Lab Results  Component Value Date   HGBA1C 6.8 04/30/2018   HGBA1C 6.5 01/22/2018   HGBA1C 6.6 10/17/2017     Assessment & Plan   1. Rash Unsure etiology - cetirizine (ZYRTEC) 10 MG tablet; Take 1 tablet (10 mg total) by mouth daily.  Dispense: 30 tablet; Refill: 11 - predniSONE (DELTASONE) 10 MG tablet; 6,5,4,3,2,1 take each days dose all at once in the morning with food.  Dispense: 21 tablet; Refill: 0 - Ambulatory referral to Dermatology  2. Controlled type 2 diabetes mellitus without complication, without long-term current use of insulin ([place]) Stable/controlled.  Watch closely esp on prednisone - Glucose (CBG)  3. Post-nasal drip - cetirizine (ZYRTEC) 10 MG tablet; Take 1 tablet (10 mg total) by mouth daily.  Dispense: 30 tablet; Refill: 11  4. Dry eyes - Polyethyl Glycol-Propyl Glycol 0.4-0.3 % SOLN; Apply 1 drop to eye 1 day or 1 dose for 1 dose.  Dispense: 10 mL; Refill: 5  5. Language barrier stratus interpreters used and additional time performing visit was required.   Patient have been counseled extensively about nutrition and exercise  Return in about 2 months (around 10/16/2018) for with PCP/Dr Newlin.  The patient was given clear instructions to go to ER or return to medical center if symptoms don't improve, worsen or new problems develop. The patient  verbalized understanding. The patient was told to call to get lab results if they haven't heard anything in the next week.     Freeman Caldron, PA-C [facility] [place], [place]   08/16/2018, 11:57 AM

## 2018-08-16 NOTE — Progress Notes (Signed)
Pt. Stated he is having itchiness on his abdomen and back.

## 2018-08-18 ENCOUNTER — Emergency Department (HOSPITAL_COMMUNITY)
Admission: EM | Admit: 2018-08-18 | Discharge: 2018-08-18 | Disposition: A | Discharge status: Home / Self Care | Payer: Medicare Other | Attending: Emergency Medicine | Admitting: Emergency Medicine

## 2018-08-18 ENCOUNTER — Other Ambulatory Visit: Payer: Self-pay

## 2018-08-18 ENCOUNTER — Emergency Department (HOSPITAL_COMMUNITY): Payer: Medicare Other

## 2018-08-18 ENCOUNTER — Encounter (HOSPITAL_COMMUNITY): Payer: Self-pay

## 2018-08-18 DIAGNOSIS — Z79899 Other long term (current) drug therapy: Secondary | ICD-10-CM | POA: Diagnosis not present

## 2018-08-18 DIAGNOSIS — R197 Diarrhea, unspecified: Secondary | ICD-10-CM

## 2018-08-18 DIAGNOSIS — J449 Chronic obstructive pulmonary disease, unspecified: Secondary | ICD-10-CM | POA: Insufficient documentation

## 2018-08-18 DIAGNOSIS — Z7984 Long term (current) use of oral hypoglycemic drugs: Secondary | ICD-10-CM | POA: Insufficient documentation

## 2018-08-18 DIAGNOSIS — F17220 Nicotine dependence, chewing tobacco, uncomplicated: Secondary | ICD-10-CM | POA: Diagnosis not present

## 2018-08-18 DIAGNOSIS — Z7982 Long term (current) use of aspirin: Secondary | ICD-10-CM | POA: Insufficient documentation

## 2018-08-18 DIAGNOSIS — Z20828 Contact with and (suspected) exposure to other viral communicable diseases: Secondary | ICD-10-CM | POA: Diagnosis not present

## 2018-08-18 DIAGNOSIS — E1140 Type 2 diabetes mellitus with diabetic neuropathy, unspecified: Secondary | ICD-10-CM | POA: Diagnosis not present

## 2018-08-18 DIAGNOSIS — K529 Noninfective gastroenteritis and colitis, unspecified: Secondary | ICD-10-CM | POA: Diagnosis not present

## 2018-08-18 LAB — COMPREHENSIVE METABOLIC PANEL
ALT: 20 U/L (ref 0–44)
AST: 22 U/L (ref 15–41)
Albumin: 4 g/dL (ref 3.5–5.0)
Alkaline Phosphatase: 120 U/L (ref 38–126)
Anion gap: 11 (ref 5–15)
BUN: 18 mg/dL (ref 8–23)
CO2: 21 mmol/L — ABNORMAL LOW (ref 22–32)
Calcium: 9.5 mg/dL (ref 8.9–10.3)
Chloride: 105 mmol/L (ref 98–111)
Creatinine, Ser: 1.09 mg/dL (ref 0.61–1.24)
GFR calc Af Amer: >60 mL/min (ref >60)
GFR calc non Af Amer: >60 mL/min (ref >60)
Glucose, Bld: 128 mg/dL — ABNORMAL HIGH (ref 70–99)
Potassium: 4.2 mmol/L (ref 3.5–5.1)
Sodium: 137 mmol/L (ref 135–145)
Total Bilirubin: 0.4 mg/dL (ref 0.3–1.2)
Total Protein: 7.6 g/dL (ref 6.5–8.1)

## 2018-08-18 LAB — CBC WITH DIFFERENTIAL/PLATELET
Abs Immature Granulocytes: 0.1 10*3/uL — ABNORMAL HIGH (ref 0.00–0.07)
Basophils Absolute: 0 10*3/uL (ref 0.0–0.1)
Basophils Relative: 0 %
Eosinophils Absolute: 0 10*3/uL (ref 0.0–0.5)
Eosinophils Relative: 0 %
HCT: 46.9 % (ref 39.0–52.0)
Hemoglobin: 15 g/dL (ref 13.0–17.0)
Immature Granulocytes: 1 %
Lymphocytes Relative: 11 %
Lymphs Abs: 2.2 10*3/uL (ref 0.7–4.0)
MCH: 27.2 pg (ref 26.0–34.0)
MCHC: 32 g/dL (ref 30.0–36.0)
MCV: 85 fL (ref 80.0–100.0)
Monocytes Absolute: 1.1 10*3/uL — ABNORMAL HIGH (ref 0.1–1.0)
Monocytes Relative: 5 %
Neutro Abs: 17.3 10*3/uL — ABNORMAL HIGH (ref 1.7–7.7)
Neutrophils Relative %: 83 %
Platelets: 256 10*3/uL (ref 150–400)
RBC: 5.52 MIL/uL (ref 4.22–5.81)
RDW: 14.7 % (ref 11.5–15.5)
WBC: 20.8 10*3/uL — ABNORMAL HIGH (ref 4.0–10.5)
nRBC: 0 % (ref 0.0–0.2)

## 2018-08-18 LAB — MAGNESIUM: Magnesium: 1.6 mg/dL — ABNORMAL LOW (ref 1.7–2.4)

## 2018-08-18 LAB — SARS CORONAVIRUS 2 BY RT PCR (HOSPITAL ORDER, PERFORMED IN ~~LOC~~ HOSPITAL LAB): SARS Coronavirus 2: NEGATIVE

## 2018-08-18 MED ORDER — ALBUTEROL SULFATE HFA 108 (90 BASE) MCG/ACT IN AERS
4.0000 | INHALATION_SPRAY | Freq: Once | RESPIRATORY_TRACT | Status: AC
  Administered 2018-08-18: 12:00:00 4 via RESPIRATORY_TRACT
  Filled 2018-08-18: qty 6.7

## 2018-08-18 MED ORDER — IOHEXOL 350 MG/ML SOLN
100.0000 mL | Freq: Once | INTRAVENOUS | Status: AC | PRN
  Administered 2018-08-18: 15:00:00 100 mL via INTRAVENOUS

## 2018-08-18 MED ORDER — LACTATED RINGERS IV BOLUS
1000.0000 mL | Freq: Once | INTRAVENOUS | Status: AC
  Administered 2018-08-18: 11:00:00 1000 mL via INTRAVENOUS

## 2018-08-18 MED ORDER — MAGNESIUM SULFATE 2 GM/50ML IV SOLN
2.0000 g | Freq: Once | INTRAVENOUS | Status: AC
  Administered 2018-08-18: 2 g via INTRAVENOUS
  Filled 2018-08-18: qty 50

## 2018-08-18 MED ORDER — LOPERAMIDE HCL 2 MG PO CAPS: 2.0000 mg | ORAL_CAPSULE | Freq: Four times a day (QID) | ORAL | 0 refills | Status: DC | PRN

## 2018-08-18 MED ORDER — AEROCHAMBER PLUS FLO-VU LARGE MISC
1.0000 | Freq: Once | Status: AC
  Administered 2018-08-18: 12:00:00 1

## 2018-08-18 NOTE — Discharge Instructions (Signed)
We saw in the ER for diarrheal illness. Results of the ER reveal no significant abnormalities besides low magnesium which was replaced. We did appreciate that you have lung disease, and that your oxygen saturation was frequently lower than normal.  We recommend that you follow-up with your primary care doctor for further evaluation of your primary lung disease.  Return to the ER immediately if you start having bloody stools or fevers.  Take the medications for diarrhea only for nighttime symptoms.

## 2018-08-18 NOTE — ED Notes (Signed)
Translator outside of room

## 2018-08-18 NOTE — ED Triage Notes (Signed)
Pt from home c/o diarrhea x 2 days; denies fevers; denies n/v; denies sick contacts; denies abd pain; endorses 9-10 episodes of diarrhea since this am

## 2018-08-18 NOTE — ED Provider Notes (Signed)
[facility] EMERGENCY DEPARTMENT Provider Note   CSN: [id number] Arrival date & time: 08/18/18  0957    History   Chief Complaint Chief Complaint  Patient presents with   Diarrhea    HPI David Barber is a 68 y.o. male.     HPI  68 year old with history of COPD, bronchitis comes in a chief complaint of diarrhea.  Patient also likely has diabetic neuropathy, hyperlipidemia.  Translation services were utilized for the service.  Patient comes to the ER primarily for diarrhea. He reports that he started having diarrhea 2 days ago.  He has had about 8-10 episodes of loose bowel movements that are nonbloody.  He has had nausea without vomiting.  Patient denies any significant abdominal pain.  His grandkids are having similar symptoms, but they started getting sick after him.  Patient denies any recent travel history.  He is noted to have low oxygen saturation.  He denies any new cough but does indicate he has some shortness of breath.  Patient tells me that he does not have COPD that he is aware of. Pt has no hx of PE, DVT and denies any exogenous hormone (testosterone / estrogen) use, long distance travels or surgery in the past 6 weeks, active cancer, recent immobilization.   Past Medical History:  Diagnosis Date   Bronchitis    COPD (chronic obstructive pulmonary disease) ([place])    GERD (gastroesophageal reflux disease)     Patient Active Problem List   Diagnosis Date Noted   Diabetic neuropathy ([place]) 01/22/2018   Hyperlipidemia associated with type 2 diabetes mellitus ([place]) 07/29/2015   Hearing loss 07/29/2015   Vitamin D deficiency 05/15/2015   Chronic low back pain 02/11/2015   COPD, mild ([place]) 11/17/2014   Chewing tobacco use 11/17/2014   Chest pain 06/18/2014   GERD (gastroesophageal reflux disease) 06/18/2014   Nonspecific abnormal electrocardiogram (ECG) (EKG) 06/16/2014   Diabetes type 2, controlled ([place]) 12/21/2012     Past Surgical History:  Procedure Laterality Date   CYST REMOVAL NECK  2013   benign        Home Medications    Prior to Admission medications   Medication Sig Start Date End Date Taking? Authorizing Provider  albuterol (PROVENTIL HFA;VENTOLIN HFA) 108 (90 Base) MCG/ACT inhaler Inhale 2 puffs into the lungs every 6 (six) hours as needed for wheezing or shortness of breath. 04/30/18   Charlott Rakes, MD  aspirin EC 81 MG tablet Take 1 tablet (81 mg total) by mouth daily. 11/15/17   Argentina Donovan, PA-C  atorvastatin (LIPITOR) 80 MG tablet Take 1 tablet (80 mg total) by mouth daily. 04/30/18   Charlott Rakes, MD  buPROPion (WELLBUTRIN SR) 150 MG 12 hr tablet Take 1 tablet (150 mg total) by mouth 2 (two) times daily. 05/22/18   Charlott Rakes, MD  cetirizine (ZYRTEC) 10 MG tablet Take 1 tablet (10 mg total) by mouth daily. 08/16/18   Argentina Donovan, PA-C  ergocalciferol (DRISDOL) 50000 units capsule Take 1 capsule (50,000 Units total) by mouth once a week. 01/23/18   Charlott Rakes, MD  Fluticasone-Salmeterol (ADVAIR DISKUS) 250-50 MCG/DOSE AEPB Inhale 1 puff into the lungs 2 (two) times daily. 04/30/18   Charlott Rakes, MD  gabapentin (NEURONTIN) 300 MG capsule Take 1 capsule (300 mg total) by mouth at bedtime. 04/30/18   Charlott Rakes, MD  hydrocortisone cream 0.5 % Apply 1 application topically 2 (two) times daily. Patient not taking: Reported on 01/18/2018 07/18/17   Newlin,  Enobong, MD  ketotifen (ZADITOR) 0.025 % ophthalmic solution Place 1 drop into both eyes 2 (two) times daily. Patient not taking: Reported on 04/18/2017 02/15/17   Argentina Donovan, PA-C  loperamide (IMODIUM) 2 MG capsule Take 1 capsule (2 mg total) by mouth 4 (four) times daily as needed for diarrhea or loose stools. 08/18/18   Varney Biles, MD  metFORMIN (GLUCOPHAGE XR) 500 MG 24 hr tablet Take 1 tablet (500 mg total) by mouth 2 (two) times daily. 04/30/18   Charlott Rakes, MD  methocarbamol (ROBAXIN)  500 MG tablet Take 1 tablet (500 mg total) by mouth 2 (two) times daily as needed for muscle spasms. 01/22/18   Charlott Rakes, MD  predniSONE (DELTASONE) 10 MG tablet 6,5,4,3,2,1 take each days dose all at once in the morning with food. 08/16/18   Argentina Donovan, PA-C  tiotropium (SPIRIVA HANDIHALER) 18 MCG inhalation capsule Place 1 capsule (18 mcg total) into inhaler and inhale daily. 04/30/18   Charlott Rakes, MD  triamcinolone cream (KENALOG) 0.1 % Apply 1 application topically 2 (two) times daily. 04/30/18   Charlott Rakes, MD    Family History Family History  Problem Relation Age of Onset   Colon cancer Neg Hx     Social History Social History   Tobacco Use   Smoking status: Former Smoker    Types: Cigarettes    Last attempt to quit: 10/19/2013    Years since quitting: 4.8   Smokeless tobacco: Current User   Tobacco comment: chewing tobacco  Substance Use Topics   Alcohol use: Yes    Alcohol/week: 3.0 standard drinks    Types: 3 Cans of beer per week   Drug use: No     Allergies   Patient has no known allergies.   Review of Systems Review of Systems  Constitutional: Positive for activity change.  Respiratory: Negative for shortness of breath.   Cardiovascular: Negative for chest pain.  Gastrointestinal: Positive for diarrhea.  Genitourinary: Negative for dysuria.  All other systems reviewed and are negative.    Physical Exam Updated Vital Signs BP 112/83 (BP Location: Right Arm)    Pulse (!) 113    Temp 98.7 F (37.1 C) (Oral)    Resp 16    Ht 5\' 5"  (1.651 m)    Wt 80.3 kg    SpO2 100%    BMI 29.45 kg/m   Physical Exam Vitals signs and nursing note reviewed.  Constitutional:      Appearance: He is well-developed.  HENT:     Head: Normocephalic and atraumatic.  Eyes:     Conjunctiva/sclera: Conjunctivae normal.     Pupils: Pupils are equal, round, and reactive to light.  Neck:     Musculoskeletal: Normal range of motion and neck supple.   Cardiovascular:     Rate and Rhythm: Normal rate and regular rhythm.  Pulmonary:     Effort: Pulmonary effort is normal.     Breath sounds: Normal breath sounds. No wheezing.  Abdominal:     General: Bowel sounds are normal. There is no distension.     Palpations: Abdomen is soft. There is no mass.     Tenderness: There is no abdominal tenderness. There is no guarding or rebound.  Musculoskeletal:        General: No deformity.  Skin:    General: Skin is warm.  Neurological:     Mental Status: He is alert and oriented to person, place, and time.      ED  Treatments / Results  Labs (all labs ordered are listed, but only abnormal results are displayed) Labs Reviewed  COMPREHENSIVE METABOLIC PANEL - Abnormal; Notable for the following components:      Result Value   CO2 21 (*)    Glucose, Bld 128 (*)    All other components within normal limits  CBC WITH DIFFERENTIAL/PLATELET - Abnormal; Notable for the following components:   WBC 20.8 (*)    Neutro Abs 17.3 (*)    Monocytes Absolute 1.1 (*)    Abs Immature Granulocytes 0.10 (*)    All other components within normal limits  MAGNESIUM - Abnormal; Notable for the following components:   Magnesium 1.6 (*)    All other components within normal limits  SARS CORONAVIRUS 2 (HOSPITAL ORDER, [facility])  GASTROINTESTINAL PANEL BY PCR, STOOL (REPLACES STOOL CULTURE)    EKG EKG Interpretation  Date/Time:  Saturday Aug 18 2018 13:54:31 EDT Ventricular Rate:  120 PR Interval:    QRS Duration: 82 QT Interval:  316 QTC Calculation: 447 R Axis:   20 Text Interpretation:  Sinus tachycardia Borderline low voltage, extremity leads [id number] No acute changes No significant change since last tracing Confirmed by Varney Biles [phone]) on 08/18/2018 3:35:12 PM   Radiology Ct Angio Chest Pe W And/or Wo Contrast  Result Date: 08/18/2018 CLINICAL DATA:  Hypoxemia or respiratory failure. Diarrhea for 2 days. EXAM:  CT ANGIOGRAPHY CHEST CT ABDOMEN AND PELVIS WITH CONTRAST TECHNIQUE: Multidetector CT imaging of the chest was performed using the standard protocol during bolus administration of intravenous contrast. Multiplanar CT image reconstructions and MIPs were obtained to evaluate the vascular anatomy. Multidetector CT imaging of the abdomen and pelvis was performed using the standard protocol during bolus administration of intravenous contrast. CONTRAST:  135mL OMNIPAQUE IOHEXOL 350 MG/ML SOLN COMPARISON:  None. FINDINGS: CTA CHEST FINDINGS Cardiovascular: Normal heart size. No pericardial effusion. Negative for pulmonary artery filling defect. Four vessel arch. No notable calcification. Mediastinum/Nodes: Mild prominence of hilar and subcarinal lymph nodes considered reactive to the pulmonary findings. Lungs/Pleura: Centrilobular and paraseptal emphysema. There is mild generalized airway thickening. Dependent atelectasis. There is no edema, consolidation, effusion, or pneumothorax. Musculoskeletal: No acute or aggressive finding Review of the MIP images confirms the above findings. CT ABDOMEN and PELVIS FINDINGS Hepatobiliary: No focal liver abnormality.No evidence of biliary obstruction or stone. Pancreas: Unremarkable. Spleen: Unremarkable. Adrenals/Urinary Tract: Negative adrenals. No hydronephrosis or stone. Unremarkable bladder. Stomach/Bowel: No obstruction. No appendicitis. History of diarrhea. There is no colonic wall thickening. Vascular/Lymphatic: No acute vascular abnormality. No mass or adenopathy. Reproductive:Negative. Other: No ascites or pneumoperitoneum. Musculoskeletal: No acute abnormalities. Review of the MIP images confirms the above findings. IMPRESSION: Chest CTA: 1. Negative for pulmonary embolism. 2. Emphysema and airway thickening with mild atelectasis. Abdominal CT: No acute finding.  No obstruction or visible bowel inflammation. Electronically Signed   By: Monte Fantasia M.D.   On: 08/18/2018  15:28   Ct Abdomen Pelvis W Contrast  Result Date: 08/18/2018 CLINICAL DATA:  Hypoxemia or respiratory failure. Diarrhea for 2 days. EXAM: CT ANGIOGRAPHY CHEST CT ABDOMEN AND PELVIS WITH CONTRAST TECHNIQUE: Multidetector CT imaging of the chest was performed using the standard protocol during bolus administration of intravenous contrast. Multiplanar CT image reconstructions and MIPs were obtained to evaluate the vascular anatomy. Multidetector CT imaging of the abdomen and pelvis was performed using the standard protocol during bolus administration of intravenous contrast. CONTRAST:  177mL OMNIPAQUE IOHEXOL 350 MG/ML SOLN COMPARISON:  None.  FINDINGS: CTA CHEST FINDINGS Cardiovascular: Normal heart size. No pericardial effusion. Negative for pulmonary artery filling defect. Four vessel arch. No notable calcification. Mediastinum/Nodes: Mild prominence of hilar and subcarinal lymph nodes considered reactive to the pulmonary findings. Lungs/Pleura: Centrilobular and paraseptal emphysema. There is mild generalized airway thickening. Dependent atelectasis. There is no edema, consolidation, effusion, or pneumothorax. Musculoskeletal: No acute or aggressive finding Review of the MIP images confirms the above findings. CT ABDOMEN and PELVIS FINDINGS Hepatobiliary: No focal liver abnormality.No evidence of biliary obstruction or stone. Pancreas: Unremarkable. Spleen: Unremarkable. Adrenals/Urinary Tract: Negative adrenals. No hydronephrosis or stone. Unremarkable bladder. Stomach/Bowel: No obstruction. No appendicitis. History of diarrhea. There is no colonic wall thickening. Vascular/Lymphatic: No acute vascular abnormality. No mass or adenopathy. Reproductive:Negative. Other: No ascites or pneumoperitoneum. Musculoskeletal: No acute abnormalities. Review of the MIP images confirms the above findings. IMPRESSION: Chest CTA: 1. Negative for pulmonary embolism. 2. Emphysema and airway thickening with mild atelectasis.  Abdominal CT: No acute finding.  No obstruction or visible bowel inflammation. Electronically Signed   By: Monte Fantasia M.D.   On: 08/18/2018 15:28   Dg Chest Port 1 View  Result Date: 08/18/2018 CLINICAL DATA:  Hypoxia EXAM: PORTABLE CHEST 1 VIEW COMPARISON:  06/11/2012 chest radiograph. FINDINGS: Slightly low lung volumes. Stable cardiomediastinal silhouette with top-normal heart size. No pneumothorax. No pleural effusion. No pulmonary edema. Hazy bibasilar lung opacities. IMPRESSION: Slightly low lung volumes. Hazy bibasilar lung opacities, favor atelectasis, difficult to exclude aspiration or pneumonia. Follow-up chest radiographs advised. Electronically Signed   By: Ilona Sorrel M.D.   On: 08/18/2018 11:47    Procedures Procedures (including critical care time)  Medications Ordered in ED Medications  lactated ringers bolus 1,000 mL (0 mLs Intravenous Stopped 08/18/18 1339)  albuterol (VENTOLIN HFA) 108 (90 Base) MCG/ACT inhaler 4 puff (4 puffs Inhalation Given 08/18/18 1131)  AeroChamber Plus Flo-Vu Large MISC 1 each (1 each Other Given 08/18/18 1132)  magnesium sulfate IVPB 2 g 50 mL (0 g Intravenous Stopped 08/18/18 1602)  iohexol (OMNIPAQUE) 350 MG/ML injection 100 mL (100 mLs Intravenous Contrast Given 08/18/18 1441)     Initial Impression / Assessment and Plan / ED Course  I have reviewed the triage vital signs and the nursing notes.  Pertinent labs & imaging results that were available during my care of the patient were reviewed by me and considered in my medical decision making (see chart for details).  Clinical Course as of Aug 19 1007  Sat Aug 18, 2018  1540 Results of the scan reviewed.  No acute abnormalities appreciated.  Patient wants to go home. His white count is elevated, but without fevers and no significant findings on the CT abdomen I do not see any reason to start him on antibiotics.  The elevated white count could just be reactive.  Strict ER return precautions  have been discussed with him.  Additionally he has been informed to follow-up with his PCP about his primary lung disease that can be appreciated on CT scan as well.  Strict ER return precautions have been discussed, and patient is agreeing with the plan and is comfortable with the workup done and the recommendations from the ER.   CT Angio Chest PE W and/or Wo Contrast [AN]    Clinical Course User Index [AN] Varney Biles, MD       Patient comes in a chief complaint of diarrhea. His primary complaint is diarrhea.  He is noted to have white count elevation.  He has no  abdominal tenderness.  Suspect colitis.  He is also tachycardic and he has O2 sats around 90%.  He denies any cough, chest pain.  There is some shortness of breath.  His lung exam right now is clear.  No signs of CHF.  In his chart there is noted that he has COPD, but patient denies.  He does not smoke.  Saw the COVID patients get a GI symptom, therefore we tested him for COVID.  Cover test came back negative, we moved on to CT angiogram chest and abdomen pelvis with contrast to ensure that the diarrhea is not infectious given the significantly elevated white count.  David Barber was evaluated in Emergency Department on 08/19/2018 for the symptoms described in the history of present illness. He was evaluated in the context of the global COVID-19 pandemic, which necessitated consideration that the patient might be at risk for infection with the SARS-CoV-2 virus that causes COVID-19. Institutional protocols and algorithms that pertain to the evaluation of patients at risk for COVID-19 are in a state of rapid change based on information released by regulatory bodies including the CDC and federal and state organizations. These policies and algorithms were followed during the patient's care in the ED.   Final Clinical Impressions(s) / ED Diagnoses   Final diagnoses:  Diarrhea of presumed infectious origin  Colitis  Chronic  obstructive pulmonary disease, unspecified COPD type [facility])    ED Discharge Orders         Ordered    loperamide (IMODIUM) 2 MG capsule  4 times daily PRN     08/18/18 1540           Varney Biles, MD 08/19/18 1012

## 2018-09-04 MED FILL — SPIRIVA 18 MCG CP-HANDIHALE: 18 | 30 days supply | Qty: 30 | Fill #4

## 2018-09-04 MED FILL — ATORVASTATIN 80 MG TABLET: 80 | 30 days supply | Qty: 30 | Fill #6

## 2018-09-04 MED FILL — CETIRIZINE HCL 10 MG TABS: 10 | 30 days supply | Qty: 30 | Fill #6

## 2018-09-04 MED FILL — VENTOLIN HFA 90 MCG INHALER: 108 (90 BAS | 25 days supply | Qty: 18 | Fill #4

## 2018-09-04 MED FILL — METFORMIN HCL ER 500 MG TAB: 500 | 30 days supply | Qty: 60 | Fill #6

## 2018-09-05 MED FILL — ADVAIR 250/50 DISKUS: 250-50 | 30 days supply | Qty: 60 | Fill #4

## 2018-10-09 MED FILL — CETIRIZINE HCL 10 MG TABS: 10 | 30 days supply | Qty: 30 | Fill #7

## 2018-10-09 MED FILL — ATORVASTATIN 80 MG TABLET: 80 | 30 days supply | Qty: 30 | Fill #0

## 2018-10-09 MED FILL — SPIRIVA 18 MCG CP-HANDIHALE: 18 | 30 days supply | Qty: 30 | Fill #5

## 2018-10-09 MED FILL — ADVAIR 250/50 DISKUS: 250-50 | 30 days supply | Qty: 60 | Fill #5

## 2018-10-09 MED FILL — METFORMIN HCL ER 500 MG TB2: 500 | 30 days supply | Qty: 60 | Fill #0

## 2018-10-09 MED FILL — VENTOLIN HFA 90 MCG INHALER: 108 (90 BAS | 25 days supply | Qty: 18 | Fill #5

## 2018-10-16 ENCOUNTER — Ambulatory Visit: Payer: Medicaid Other | Admitting: Family Medicine

## 2018-10-29 ENCOUNTER — Ambulatory Visit: Payer: Medicaid Other | Admitting: Family Medicine

## 2018-11-12 MED FILL — CETIRIZINE HCL 10 MG TABS: 10 | 30 days supply | Qty: 30 | Fill #8

## 2018-11-12 MED FILL — METFORMIN HCL ER 500 MG TB2: 500 | 30 days supply | Qty: 60 | Fill #1

## 2018-11-12 MED FILL — SPIRIVA 18 MCG CP-HANDIHALE: 18 | 30 days supply | Qty: 30 | Fill #6

## 2018-11-12 MED FILL — ATORVASTATIN 80 MG TABLET: 80 | 30 days supply | Qty: 30 | Fill #1

## 2018-12-03 ENCOUNTER — Ambulatory Visit: Payer: Medicare Other | Attending: Family Medicine | Admitting: Family Medicine

## 2018-12-03 ENCOUNTER — Other Ambulatory Visit: Payer: Self-pay

## 2018-12-03 ENCOUNTER — Encounter: Payer: Self-pay | Admitting: Family Medicine

## 2018-12-03 DIAGNOSIS — E119 Type 2 diabetes mellitus without complications: Secondary | ICD-10-CM

## 2018-12-03 DIAGNOSIS — J449 Chronic obstructive pulmonary disease, unspecified: Secondary | ICD-10-CM

## 2018-12-03 DIAGNOSIS — B86 Scabies: Secondary | ICD-10-CM

## 2018-12-03 DIAGNOSIS — E1169 Type 2 diabetes mellitus with other specified complication: Secondary | ICD-10-CM

## 2018-12-03 DIAGNOSIS — E1149 Type 2 diabetes mellitus with other diabetic neurological complication: Secondary | ICD-10-CM

## 2018-12-03 DIAGNOSIS — E785 Hyperlipidemia, unspecified: Secondary | ICD-10-CM

## 2018-12-03 MED ORDER — METFORMIN HCL ER 500 MG PO TB24: 500.0000 mg | ORAL_TABLET | Freq: Two times a day (BID) | ORAL | 6 refills | Status: DC

## 2018-12-03 MED ORDER — SPIRIVA HANDIHALER 18 MCG IN CAPS: 18.0000 ug | ORAL_CAPSULE | Freq: Every day | RESPIRATORY_TRACT | 6 refills | Status: DC

## 2018-12-03 MED ORDER — GABAPENTIN 300 MG PO CAPS: 300.0000 mg | ORAL_CAPSULE | Freq: Every day | ORAL | 6 refills | Status: DC

## 2018-12-03 MED ORDER — FLUTICASONE-SALMETEROL 250-50 MCG/DOSE IN AEPB: 1.0000 | INHALATION_SPRAY | Freq: Two times a day (BID) | RESPIRATORY_TRACT | 6 refills | Status: DC

## 2018-12-03 MED ORDER — ATORVASTATIN CALCIUM 80 MG PO TABS: 80.0000 mg | ORAL_TABLET | Freq: Every day | ORAL | 6 refills | Status: DC

## 2018-12-03 MED ORDER — PERMETHRIN 5 % EX CREA: 1.0000 "application " | TOPICAL_CREAM | Freq: Once | CUTANEOUS | 0 refills | Status: AC

## 2018-12-03 MED FILL — ADVAIR 250/50 DISKUS: 250-50 | 30 days supply | Qty: 60 | Fill #0

## 2018-12-03 MED FILL — GABAPENTIN 300 MG CAPSULE: 300 | 30 days supply | Qty: 30 | Fill #0

## 2018-12-03 MED FILL — PERMETHRIN 5% CREAM: 5 | 1 days supply | Qty: 60 | Fill #0

## 2018-12-03 NOTE — Progress Notes (Signed)
Patient has been called and DOB has been verified. Patient has been screened and transferred to PCP to start phone visit.   Patient states that he has a rash his stomach, legs and butt area.

## 2018-12-03 NOTE — Progress Notes (Signed)
Virtual Visit via Telephone Note  I connected with David Barber, on 12/03/2018 at 2:20 PM by telephone due to the COVID-19 pandemic and verified that I am speaking with the correct person using two identifiers.   Consent: I discussed the limitations, risks, security and privacy concerns of performing an evaluation and management service by telephone and the availability of in person appointments. I also discussed with the patient that there may be a patient responsible charge related to this service. The patient expressed understanding and agreed to proceed.   Location of Patient: Home  Location of Provider: Clinic   Persons participating in Telemedicine visit: Romulo Lofstrom Farrington-CMA Dr. Arlice Colt ID# [phone] - Interpreter     History of Present Illness: David Barber  is a 68 year old male with a history of COPD, type 2 diabetes mellitus (A1c 6.8 from 04/2018), hyperlipidemia COPD who presents today for a follow-up visit seen   He has had a rash on his stomach, legs, buttock for the last 4-5 days and itch is worse at night and after scratching or when he has clothes on. Denies presence of fevers, allergies to foods, soaps or creams. No other family member is experiencing similar symptoms.  With regards to his diabetes mellitus he is doing well and his diabetes is controlled with an A1c of 6.8.  His COPD is stable with no recent exacerbation. He denies chest pain, dyspnea and has no additional concerns today.   Past Medical History:  Diagnosis Date  . Bronchitis   . COPD (chronic obstructive pulmonary disease) ([place])   . GERD (gastroesophageal reflux disease)    No Known Allergies  Current Outpatient Medications on File Prior to Visit  Medication Sig Dispense Refill  . albuterol (PROVENTIL HFA;VENTOLIN HFA) 108 (90 Base) MCG/ACT inhaler Inhale 2 puffs into the lungs every 6 (six) hours as needed for wheezing or shortness of breath. 1  Inhaler 6  . aspirin EC 81 MG tablet Take 1 tablet (81 mg total) by mouth daily. 30 tablet 5  . atorvastatin (LIPITOR) 80 MG tablet Take 1 tablet (80 mg total) by mouth daily. 30 tablet 6  . buPROPion (WELLBUTRIN SR) 150 MG 12 hr tablet Take 1 tablet (150 mg total) by mouth 2 (two) times daily. 60 tablet 3  . cetirizine (ZYRTEC) 10 MG tablet Take 1 tablet (10 mg total) by mouth daily. 30 tablet 11  . ergocalciferol (DRISDOL) 50000 units capsule Take 1 capsule (50,000 Units total) by mouth once a week. 9 capsule 1  . Fluticasone-Salmeterol (ADVAIR DISKUS) 250-50 MCG/DOSE AEPB Inhale 1 puff into the lungs 2 (two) times daily. 1 each 6  . gabapentin (NEURONTIN) 300 MG capsule Take 1 capsule (300 mg total) by mouth at bedtime. 30 capsule 6  . loperamide (IMODIUM) 2 MG capsule Take 1 capsule (2 mg total) by mouth 4 (four) times daily as needed for diarrhea or loose stools. 12 capsule 0  . metFORMIN (GLUCOPHAGE XR) 500 MG 24 hr tablet Take 1 tablet (500 mg total) by mouth 2 (two) times daily. 60 tablet 6  . methocarbamol (ROBAXIN) 500 MG tablet Take 1 tablet (500 mg total) by mouth 2 (two) times daily as needed for muscle spasms. 60 tablet 1  . tiotropium (SPIRIVA HANDIHALER) 18 MCG inhalation capsule Place 1 capsule (18 mcg total) into inhaler and inhale daily. 30 capsule 6  . triamcinolone cream (KENALOG) 0.1 % Apply 1 application topically 2 (two) times daily. 45 g 1  . hydrocortisone  cream 0.5 % Apply 1 application topically 2 (two) times daily. (Patient not taking: Reported on 01/18/2018) 66 g 0  . ketotifen (ZADITOR) 0.025 % ophthalmic solution Place 1 drop into both eyes 2 (two) times daily. (Patient not taking: Reported on 04/18/2017) 5 mL 1  . predniSONE (DELTASONE) 10 MG tablet 6,5,4,3,2,1 take each days dose all at once in the morning with food. (Patient not taking: Reported on 12/03/2018) 21 tablet 0   No current facility-administered medications on file prior to visit.      Observations/Objective: Awake, alert, oriented x3 Not in acute distress  Lab Results  Component Value Date   HGBA1C 6.8 04/30/2018     Assessment and Plan: 1. Controlled type 2 diabetes mellitus without complication, without long-term current use of insulin (HCC) Controlled with A1c of 6.8 Counseled on Diabetic diet, my plate method, [id number] minutes of moderate intensity exercise/week Keep blood sugar logs with fasting goals of 80-120 mg/dl, random of less than 180 and in the event of sugars less than 60 mg/dl or greater than 400 mg/dl please notify the clinic ASAP. It is recommended that you undergo annual eye exams and annual foot exams. Pneumonia vaccine is recommended. - Hemoglobin A1c; Future - Ambulatory referral to Ophthalmology - Basic Metabolic Panel; Future - metFORMIN (GLUCOPHAGE XR) 500 MG 24 hr tablet; Take 1 tablet (500 mg total) by mouth 2 (two) times daily.  Dispense: 60 tablet; Refill: 6  2. Scabies Care of beddings is important as well - permethrin (ELIMITE) 5 % cream; Apply 1 application topically once for 1 dose.  Dispense: 60 g; Refill: 0  3. Hyperlipidemia associated with type 2 diabetes mellitus (HCC) Controlled Low-cholesterol diet - atorvastatin (LIPITOR) 80 MG tablet; Take 1 tablet (80 mg total) by mouth daily.  Dispense: 30 tablet; Refill: 6  4. Other diabetic neurological complication associated with type 2 diabetes mellitus (HCC) Stable - gabapentin (NEURONTIN) 300 MG capsule; Take 1 capsule (300 mg total) by mouth at bedtime.  Dispense: 30 capsule; Refill: 6  5. COPD, mild (HCC) Stable - tiotropium (SPIRIVA HANDIHALER) 18 MCG inhalation capsule; Place 1 capsule (18 mcg total) into inhaler and inhale daily.  Dispense: 30 capsule; Refill: 6 - Fluticasone-Salmeterol (ADVAIR DISKUS) 250-50 MCG/DOSE AEPB; Inhale 1 puff into the lungs 2 (two) times daily.  Dispense: 1 each; Refill: 6   Follow Up Instructions: Return in about 6 months (around  06/02/2019) for medical conditions.    I discussed the assessment and treatment plan with the patient. The patient was provided an opportunity to ask questions and all were answered. The patient agreed with the plan and demonstrated an understanding of the instructions.   The patient was advised to call back or seek an in-person evaluation if the symptoms worsen or if the condition fails to improve as anticipated.     I provided 15 minutes total of non-face-to-face time during this encounter including median intraservice time, reviewing previous notes, labs, imaging, medications, management and patient verbalized understanding.     Charlott Rakes, MD, FAAFP. [facility] and [facility] [place], [place]   12/03/2018, 2:20 PM

## 2018-12-04 ENCOUNTER — Other Ambulatory Visit: Payer: Self-pay

## 2018-12-04 ENCOUNTER — Ambulatory Visit: Payer: Medicare Other | Attending: Family Medicine

## 2018-12-04 ENCOUNTER — Ambulatory Visit (HOSPITAL_BASED_OUTPATIENT_CLINIC_OR_DEPARTMENT_OTHER): Payer: Medicare Other | Admitting: Pharmacist

## 2018-12-04 DIAGNOSIS — E119 Type 2 diabetes mellitus without complications: Secondary | ICD-10-CM

## 2018-12-04 DIAGNOSIS — Z23 Encounter for immunization: Secondary | ICD-10-CM | POA: Diagnosis not present

## 2018-12-04 NOTE — Progress Notes (Signed)
Patient presents for vaccination against influenza per orders of Dr. Newlin. Consent given. Counseling provided. No contraindications exists. Vaccine administered without incident.   

## 2018-12-05 LAB — BASIC METABOLIC PANEL
BUN/Creatinine Ratio: 8 — ABNORMAL LOW (ref 10–24)
BUN: 7 mg/dL — ABNORMAL LOW (ref 8–27)
CO2: 23 mmol/L (ref 20–29)
Calcium: 9.7 mg/dL (ref 8.6–10.2)
Chloride: 104 mmol/L (ref 96–106)
Creatinine, Ser: 0.85 mg/dL (ref 0.76–1.27)
GFR calc Af Amer: 103 mL/min/{1.73_m2} (ref >59)
GFR calc non Af Amer: 89 mL/min/{1.73_m2} (ref >59)
Glucose: 79 mg/dL (ref 65–99)
Potassium: 4.4 mmol/L (ref 3.5–5.2)
Sodium: 142 mmol/L (ref 134–144)

## 2018-12-05 LAB — HEMOGLOBIN A1C
Est. average glucose Bld gHb Est-mCnc: 146 mg/dL
Hgb A1c MFr Bld: 6.7 % — ABNORMAL HIGH (ref 4.8–5.6)

## 2018-12-06 ENCOUNTER — Telehealth (INDEPENDENT_AMBULATORY_CARE_PROVIDER_SITE_OTHER): Payer: Self-pay

## 2018-12-06 NOTE — Telephone Encounter (Signed)
-----   Message from Charlott Rakes, MD sent at 12/05/2018  5:12 PM EDT ----- Please inform the patient that labs are normal. Thank you.

## 2018-12-06 NOTE — Telephone Encounter (Signed)
Patient was called and a voicemail was left informing patient to return phone call for lab results. 

## 2018-12-25 MED FILL — ATORVASTATIN 80 MG TABLET: 80 | 30 days supply | Qty: 30 | Fill #2

## 2018-12-25 MED FILL — METFORMIN HCL ER 500 MG TB2: 500 | 30 days supply | Qty: 60 | Fill #2

## 2018-12-25 MED FILL — CETIRIZINE HCL 10 MG TABS: 10 | 30 days supply | Qty: 30 | Fill #9

## 2018-12-25 MED FILL — SPIRIVA 18 MCG CP-HANDIHALE: 18 | 30 days supply | Qty: 30 | Fill #0

## 2018-12-27 ENCOUNTER — Telehealth: Payer: Self-pay

## 2018-12-27 NOTE — Telephone Encounter (Signed)
-----   Message from Charlott Rakes, MD sent at 12/05/2018  5:12 PM EDT ----- Please inform the patient that labs are normal. Thank you.

## 2018-12-27 NOTE — Telephone Encounter (Signed)
Patient name and DOB has been verified Patient was informed of lab results. Patient had no questions.  

## 2018-12-29 ENCOUNTER — Ambulatory Visit (HOSPITAL_COMMUNITY)
Admission: EM | Admit: 2018-12-29 | Discharge: 2018-12-29 | Disposition: A | Discharge status: Home / Self Care | Payer: Medicare Other

## 2018-12-29 ENCOUNTER — Encounter (HOSPITAL_COMMUNITY): Payer: Self-pay

## 2018-12-29 DIAGNOSIS — B86 Scabies: Secondary | ICD-10-CM

## 2018-12-29 MED ORDER — DIPHENHYDRAMINE HCL 25 MG PO TABS: 25.0000 mg | ORAL_TABLET | Freq: Four times a day (QID) | ORAL | 0 refills | Status: DC | PRN

## 2018-12-29 MED ORDER — IVERMECTIN 3 MG PO TABS: 3.0000 mg | ORAL_TABLET | Freq: Once | ORAL | 1 refills | Status: AC

## 2018-12-29 MED ORDER — METHYLPREDNISOLONE SODIUM SUCC 125 MG IJ SOLR
INTRAMUSCULAR | Status: AC
  Filled 2018-12-29: qty 2

## 2018-12-29 MED ORDER — METHYLPREDNISOLONE SODIUM SUCC 125 MG IJ SOLR
80.0000 mg | Freq: Once | INTRAMUSCULAR | Status: AC
  Administered 2018-12-29: 12:00:00 80 mg via INTRAMUSCULAR

## 2018-12-29 NOTE — Discharge Instructions (Addendum)
I am treating you for scabies.  We gave you a steroid injection here in clinic to help with the inflammation and itching. Also sending Benadryl to take every 6 hours as needed for itching The ivermectin is for the scabies.  You will take 4 tabs today.  You may repeat this in 1 week if needed.  I gave you a refill Follow up as needed for continued or worsening symptoms You need to wash all your clothes in hot water. If you have any old furniture at home are all close the scabies could be coming from that. Make sure you check your surroundings  Ma tap?'?nl?'? khujal?k? upac?ra gardaichu. H?m? tap?'?nl?'? klinikam? ?ka s??r?'i?a ?nj?nj?sana di'?k? chau? d?ha ra khujal?m? maddata garna. B?n??rilal?'? pani pa?h?'unak? l?gi praty?ka hours gha??? lina k? l?g? khujal? huncha Ivermectin khujal? k? l?g? h?. Tap?'?nl? ?ja 4 ?y?bahar? linuhun?cha. Tap?'?? 1 hapt?m? y? d?h?ry?'una saknuhuncha yadi ?va?yaka cha bhan?. Mail? tim?l?'? puna? bhari di'?m? nirantara v? bigram?d? lak?a?ahar?k? l?gi ?va?yakak? r?pam? anusara?a garnuh?s tap?'?nl? ?phn? sabai kapa?? t?t? p?n?m? dhunu parcha. Yadi tap?'?nsam?ga gharam? kunai pur?n? pharnicara cha bhan? sabai najika chan khujal? ty? ?'um?daicha. Ni?cita garnuh?s ki tap?'? ?phn? varipari hunuhuncha

## 2018-12-29 NOTE — ED Triage Notes (Signed)
Pt reports rash in his stomach, thighs and back x 1 month. Pt reports he was seen at his PCP for the same chief complaint and he was using an ointment did not help with the rash.

## 2018-12-29 NOTE — ED Provider Notes (Signed)
[facility]    CSN: [id number] Arrival date & time: 12/29/18  1040      History   Chief Complaint Chief Complaint  Patient presents with  . Rash    HPI David Barber is a 68 y.o. male.   Patient is a 68 year old male that presents for ongoing and worsening rash.  This is been present worsening over the past month.  He was seen by his primary for this and diagnosed and treated with scabies.  He use the cream without any relief.  Describes the rash is very itchy.    Denies any fever, joint pain. Denies any recent changes in lotions, detergents, foods or other possible irritants. No recent travel. Nobody else at home has the rash. Patient has been outside but denies any contact with plants or insects. No new foods or medications.   ROS per HPI    Rash   Past Medical History:  Diagnosis Date  . Bronchitis   . COPD (chronic obstructive pulmonary disease) ([facility])   . GERD (gastroesophageal reflux disease)     Patient Active Problem List   Diagnosis Date Noted  . Diabetic neuropathy ([facility]) 01/22/2018  . Hyperlipidemia associated with type 2 diabetes mellitus ([facility]) 07/29/2015  . Hearing loss 07/29/2015  . Vitamin D deficiency 05/15/2015  . Chronic low back pain 02/11/2015  . COPD, mild ([place]) 11/17/2014  . Chewing tobacco use 11/17/2014  . Chest pain 06/18/2014  . GERD (gastroesophageal reflux disease) 06/18/2014  . Nonspecific abnormal electrocardiogram (ECG) (EKG) 06/16/2014  . Diabetes type 2, controlled ([facility]) 12/21/2012    Past Surgical History:  Procedure Laterality Date  . CYST REMOVAL NECK  2013   benign       Home Medications    Prior to Admission medications   Medication Sig Start Date End Date Taking? Authorizing Provider  albuterol (PROVENTIL HFA;VENTOLIN HFA) 108 (90 Base) MCG/ACT inhaler Inhale 2 puffs into the lungs every 6 (six) hours as needed for wheezing or shortness of breath. 04/30/18   Charlott Rakes, MD  aspirin EC 81 MG  tablet Take 1 tablet (81 mg total) by mouth daily. 11/15/17   Argentina Donovan, PA-C  atorvastatin (LIPITOR) 80 MG tablet Take 1 tablet (80 mg total) by mouth daily. 12/03/18   Charlott Rakes, MD  buPROPion (WELLBUTRIN SR) 150 MG 12 hr tablet Take 1 tablet (150 mg total) by mouth 2 (two) times daily. 05/22/18   Charlott Rakes, MD  cetirizine (ZYRTEC) 10 MG tablet Take 1 tablet (10 mg total) by mouth daily. 08/16/18   Argentina Donovan, PA-C  diphenhydrAMINE (BENADRYL) 25 MG tablet Take 1 tablet (25 mg total) by mouth every 6 (six) hours as needed. 12/29/18   Loura Halt A, NP  ergocalciferol (DRISDOL) 50000 units capsule Take 1 capsule (50,000 Units total) by mouth once a week. 01/23/18   Charlott Rakes, MD  Fluticasone-Salmeterol (ADVAIR DISKUS) 250-50 MCG/DOSE AEPB Inhale 1 puff into the lungs 2 (two) times daily. 12/03/18   Charlott Rakes, MD  gabapentin (NEURONTIN) 300 MG capsule Take 1 capsule (300 mg total) by mouth at bedtime. 12/03/18   Charlott Rakes, MD  ivermectin (STROMECTOL) 3 MG TABS tablet Take 1 tablet (3 mg total) by mouth once for 1 dose. 12/29/18 12/29/18  Loura Halt A, NP  ketotifen (ZADITOR) 0.025 % ophthalmic solution Place 1 drop into both eyes 2 (two) times daily. Patient not taking: Reported on 04/18/2017 02/15/17   Argentina Donovan, PA-C  loperamide (IMODIUM) 2 MG  capsule Take 1 capsule (2 mg total) by mouth 4 (four) times daily as needed for diarrhea or loose stools. 08/18/18   Varney Biles, MD  metFORMIN (GLUCOPHAGE XR) 500 MG 24 hr tablet Take 1 tablet (500 mg total) by mouth 2 (two) times daily. 12/03/18   Charlott Rakes, MD  methocarbamol (ROBAXIN) 500 MG tablet Take 1 tablet (500 mg total) by mouth 2 (two) times daily as needed for muscle spasms. 01/22/18   Charlott Rakes, MD  permethrin (ELIMITE) 5 % cream  12/03/18   [provider]  tiotropium (SPIRIVA HANDIHALER) 18 MCG inhalation capsule Place 1 capsule (18 mcg total) into inhaler and inhale daily. 12/03/18    Charlott Rakes, MD  triamcinolone cream (KENALOG) 0.1 % Apply 1 application topically 2 (two) times daily. 04/30/18   Charlott Rakes, MD    Family History Family History  Problem Relation Age of Onset  . Colon cancer Neg Hx     Social History Social History   Tobacco Use  . Smoking status: Former Smoker    Types: Cigarettes    Quit date: 10/19/2013    Years since quitting: 5.1  . Smokeless tobacco: Current User  . Tobacco comment: chewing tobacco  Substance Use Topics  . Alcohol use: Yes    Alcohol/week: 3.0 standard drinks    Types: 3 Cans of beer per week  . Drug use: No     Allergies   Patient has no known allergies.   Review of Systems Review of Systems  Skin: Positive for rash.     Physical Exam Triage Vital Signs ED Triage Vitals  Enc Vitals Group     BP 12/29/18 1122 113/78     Pulse Rate 12/29/18 1122 (!) 102     Resp 12/29/18 1122 16     Temp 12/29/18 1122 98.4 F (36.9 C)     Temp Source 12/29/18 1122 Oral     SpO2 12/29/18 1122 96 %     Weight --      Height --      Head Circumference --      Peak Flow --      Pain Score 12/29/18 1115 0     Pain Loc --      Pain Edu? --      Excl. in [place]? --    No data found.  Updated Vital Signs BP 113/78 (BP Location: Left Arm)   Pulse (!) 102   Temp 98.4 F (36.9 C) (Oral)   Resp 16   SpO2 96%   Visual Acuity Right Eye Distance:   Left Eye Distance:   Bilateral Distance:    Right Eye Near:   Left Eye Near:    Bilateral Near:     Physical Exam Vitals signs and nursing note reviewed.  Constitutional:      Appearance: Normal appearance.  HENT:     Head: Normocephalic and atraumatic.     Nose: Nose normal.  Eyes:     Conjunctiva/sclera: Conjunctivae normal.  Neck:     Musculoskeletal: Normal range of motion.  Pulmonary:     Effort: Pulmonary effort is normal.  Abdominal:     Palpations: Abdomen is soft.     Tenderness: There is no abdominal tenderness.  Musculoskeletal: Normal  range of motion.  Skin:    General: Skin is warm and dry.     Findings: Rash present.     Comments: Rash to buttocks, upper thigh, papular/erythematous with excoriations.   Neurological:  Mental Status: He is alert.  Psychiatric:        Mood and Affect: Mood normal.      UC Treatments / Results  Labs (all labs ordered are listed, but only abnormal results are displayed) Labs Reviewed - No data to display  EKG   Radiology No results found.  Procedures Procedures (including critical care time)  Medications Ordered in UC Medications  methylPREDNISolone sodium succinate (SOLU-MEDROL) 125 mg/2 mL injection 80 mg (80 mg Intramuscular Given 12/29/18 1147)  methylPREDNISolone sodium succinate (SOLU-MEDROL) 125 mg/2 mL injection (has no administration in time range)    Initial Impression / Assessment and Plan / UC Course  I have reviewed the triage vital signs and the nursing notes.  Pertinent labs & imaging results that were available during my care of the patient were reviewed by me and considered in my medical decision making (see chart for details).     Rash consistent with scabies.  Failed treatment with Elimite cream Will try ivermectin and give patient steroid injection here in clinic for inflammation Benadryl for itching Follow up as needed for continued or worsening symptoms  Final Clinical Impressions(s) / UC Diagnoses   Final diagnoses:  Scabies     Discharge Instructions     I am treating you for scabies.  We gave you a steroid injection here in clinic to help with the inflammation and itching. Also sending Benadryl to take every 6 hours as needed for itching The ivermectin is for the scabies.  You will take 4 tabs today.  You may repeat this in 1 week if needed.  I gave you a refill Follow up as needed for continued or worsening symptoms You need to wash all your clothes in hot water. If you have any old furniture at home are all close the scabies  could be coming from that. Make sure you check your surroundings  Ma tap?'?nl?'? khujal?k? upac?ra gardaichu. H?m? tap?'?nl?'? klinikam? ?ka s??r?'i?a ?nj?nj?sana di'?k? chau? d?ha ra khujal?m? maddata garna. B?n??rilal?'? pani pa?h?'unak? l?gi praty?ka hours gha??? lina k? l?g? khujal? huncha Ivermectin khujal? k? l?g? h?. Tap?'?nl? ?ja 4 ?y?bahar? linuhun?cha. Tap?'?? 1 hapt?m? y? d?h?ry?'una saknuhuncha yadi ?va?yaka cha bhan?. Mail? tim?l?'? puna? bhari di'?m? nirantara v? bigram?d? lak?a?ahar?k? l?gi ?va?yakak? r?pam? anusara?a garnuh?s tap?'?nl? ?phn? sabai kapa?? t?t? p?n?m? dhunu parcha. Yadi tap?'?nsam?ga gharam? kunai pur?n? pharnicara cha bhan? sabai najika chan khujal? ty? ?'um?daicha. Ni?cita garnuh?s ki tap?'? ?phn? varipari hunuhuncha    ED Prescriptions    Medication Sig Dispense Auth. Provider   ivermectin (STROMECTOL) 3 MG TABS tablet Take 1 tablet (3 mg total) by mouth once for 1 dose. 4 tablet Jireh Vinas A, NP   diphenhydrAMINE (BENADRYL) 25 MG tablet Take 1 tablet (25 mg total) by mouth every 6 (six) hours as needed. 30 tablet Loura Halt A, NP     PDMP not reviewed this encounter.   Loura Halt A, NP 12/29/18 1320

## 2019-01-09 MED FILL — ADVAIR 250/50 DISKUS: 250-50 | 30 days supply | Qty: 60 | Fill #1

## 2019-01-10 ENCOUNTER — Ambulatory Visit: Payer: Medicare Other | Attending: Family Medicine | Admitting: Physician Assistant

## 2019-01-10 ENCOUNTER — Other Ambulatory Visit: Payer: Self-pay

## 2019-01-10 DIAGNOSIS — R21 Rash and other nonspecific skin eruption: Secondary | ICD-10-CM | POA: Diagnosis not present

## 2019-01-10 DIAGNOSIS — L299 Pruritus, unspecified: Secondary | ICD-10-CM

## 2019-01-10 MED ORDER — PREDNISONE 10 MG PO TABS: ORAL_TABLET | ORAL | 0 refills | Status: DC

## 2019-01-10 MED ORDER — HYDROXYZINE HCL 25 MG PO TABS: 25.0000 mg | ORAL_TABLET | Freq: Three times a day (TID) | ORAL | 0 refills | Status: DC | PRN

## 2019-01-10 MED FILL — predniSONE 10 MG TABS: 10 | 6 days supply | Qty: 21 | Fill #0

## 2019-01-10 MED FILL — hydrOXYzine HCL 25 MG TABS: 25 | 20 days supply | Qty: 60 | Fill #0

## 2019-01-10 NOTE — Progress Notes (Signed)
Rash all over body. interrupting his sleep

## 2019-01-10 NOTE — Progress Notes (Signed)
Virtual Visit via Telephone Note  I connected with David Barber on 01/10/19 at  4:10 PM EDT by telephone and verified that I am speaking with the correct person using two identifiers.   I discussed the limitations, risks, security and privacy concerns of performing an evaluation and management service by telephone and the availability of in person appointments. I also discussed with the patient that there may be a patient responsible charge related to this service. The patient expressed understanding and agreed to proceed.  Patient location:  home My Location:  home office Persons on the call: me, the interpreter(Devi) and the patient.     History of Present Illness: Patient has had a rash for a couple of months and was last seen in the ED 12/29/2018.  He has been treated for scabies with permethrin cream, ivermectin, and prednisone.  Prednisone seems to work the best.  The rash never goes away completely and always comes back.  No new soaps or detergents.  No etiology has been determined.      Observations/Objective: A&Ox3   Assessment and Plan: 1. Itching - Ambulatory referral to Dermatology  2. Rash Responds to prednisone but recurs.  Watch blood sugars closely due to prednisone.  If consistently >300 or symptomatic go to ED - hydrOXYzine (ATARAX/VISTARIL) 25 MG tablet; Take 1 tablet (25 mg total) by mouth 3 (three) times daily as needed. For itching  Dispense: 60 tablet; Refill: 0 - predniSONE (DELTASONE) 10 MG tablet; 6,5,4,3,2,1 take each days dose at once in the morning with food  Dispense: 21 tablet; Refill: 0  Follow Up Instructions: With PCP   I discussed the assessment and treatment plan with the patient. The patient was provided an opportunity to ask questions and all were answered. The patient agreed with the plan and demonstrated an understanding of the instructions.   The patient was advised to call back or seek an in-person evaluation if the symptoms worsen or  if the condition fails to improve as anticipated.  I provided 12 minutes of non-face-to-face time during this encounter.   Freeman Caldron, PA-C  Patient ID: David Barber, male   DOB: 1951-03-01, 68 y.o.   MRN: [id number]

## 2019-01-15 ENCOUNTER — Encounter: Payer: Self-pay | Admitting: Family Medicine

## 2019-01-15 ENCOUNTER — Ambulatory Visit: Payer: Medicare Other | Attending: Family Medicine | Admitting: Family Medicine

## 2019-01-15 ENCOUNTER — Other Ambulatory Visit: Payer: Self-pay

## 2019-01-15 VITALS — BP 114/73 | HR 82 | Temp 98.5°F | Ht 65.0 in | Wt 170.2 lb

## 2019-01-15 DIAGNOSIS — K219 Gastro-esophageal reflux disease without esophagitis: Secondary | ICD-10-CM | POA: Diagnosis not present

## 2019-01-15 DIAGNOSIS — L508 Other urticaria: Secondary | ICD-10-CM

## 2019-01-15 DIAGNOSIS — Z79899 Other long term (current) drug therapy: Secondary | ICD-10-CM | POA: Insufficient documentation

## 2019-01-15 DIAGNOSIS — Z7982 Long term (current) use of aspirin: Secondary | ICD-10-CM | POA: Diagnosis not present

## 2019-01-15 DIAGNOSIS — Z7984 Long term (current) use of oral hypoglycemic drugs: Secondary | ICD-10-CM | POA: Diagnosis not present

## 2019-01-15 DIAGNOSIS — J449 Chronic obstructive pulmonary disease, unspecified: Secondary | ICD-10-CM | POA: Insufficient documentation

## 2019-01-15 DIAGNOSIS — L509 Urticaria, unspecified: Secondary | ICD-10-CM | POA: Diagnosis not present

## 2019-01-15 DIAGNOSIS — E119 Type 2 diabetes mellitus without complications: Secondary | ICD-10-CM | POA: Diagnosis not present

## 2019-01-15 DIAGNOSIS — E785 Hyperlipidemia, unspecified: Secondary | ICD-10-CM | POA: Diagnosis not present

## 2019-01-15 DIAGNOSIS — R21 Rash and other nonspecific skin eruption: Secondary | ICD-10-CM | POA: Insufficient documentation

## 2019-01-15 LAB — POCT GLYCOSYLATED HEMOGLOBIN (HGB A1C): HbA1c, POC (controlled diabetic range): 6.4 % (ref 0.0–7.0)

## 2019-01-15 LAB — GLUCOSE, POCT (MANUAL RESULT ENTRY): POC Glucose: 99 mg/dl (ref 70–99)

## 2019-01-15 MED ORDER — PREDNISONE 20 MG PO TABS: 20.0000 mg | ORAL_TABLET | Freq: Two times a day (BID) | ORAL | 0 refills | Status: DC

## 2019-01-15 MED ORDER — PERMETHRIN 5 % EX CREA: TOPICAL_CREAM | Freq: Once | CUTANEOUS | 1 refills | Status: AC

## 2019-01-15 MED FILL — PERMETHRIN 5% CREAM: 5 | 30 days supply | Qty: 60 | Fill #0

## 2019-01-15 MED FILL — predniSONE 20 MG TABS: 20 | 5 days supply | Qty: 10 | Fill #0

## 2019-01-15 NOTE — Progress Notes (Signed)
Patient states that he is having itching on his abdomen and back of legs.

## 2019-01-15 NOTE — Patient Instructions (Signed)
Pruritus Pruritus is an itchy feeling on the skin. One of the most common causes is dry skin, but many different things can cause itching. Most cases of itching do not require medical attention. Sometimes itchy skin can turn into a rash. Follow these instructions at home: Skin care   Apply moisturizing lotion to your skin as needed. Lotion that contains petroleum jelly is best.  Take medicines or apply medicated creams only as told by your health care provider. This may include: ? Corticosteroid cream. ? Anti-itch lotions. ? Oral antihistamines.  Apply a cool, wet cloth (cool compress) to the affected areas.  Take baths with one of the following: ? Epsom salts. You can get these at your local pharmacy or grocery store. Follow the instructions on the packaging. ? Baking soda. Pour a small amount into the bath as told by your health care provider. ? Colloidal oatmeal. You can get this at your local pharmacy or grocery store. Follow the instructions on the packaging.  Apply baking soda paste to your skin. To make the paste, stir water into a small amount of baking soda until it reaches a paste-like consistency.  Do not scratch your skin.  Do not take hot showers or baths, which can make itching worse. A cool shower may help with itching as long as you apply moisturizing lotion after the shower.  Do not use scented soaps, detergents, perfumes, and cosmetic products. Instead, use gentle, unscented versions of these items. General instructions  Avoid wearing tight clothes.  Keep a journal to help find out what is causing your itching. Write down: ? What you eat and drink. ? What cosmetic products you use. ? What soaps or detergents you use. ? What you wear, including jewelry.  Use a humidifier. This keeps the air moist, which helps to prevent dry skin.  Be aware of any changes in your itchiness. Contact a health care provider if:  The itching does not go away after several days.   You are unusually thirsty or urinating more than normal.  Your skin tingles or feels numb.  Your skin or the white parts of your eyes turn yellow (jaundice).  You feel weak.  You have any of the following: ? Night sweats. ? Tiredness (fatigue). ? Weight loss. ? Abdominal pain. Summary  Pruritus is an itchy feeling on the skin. One of the most common causes is dry skin, but many different conditions and factors can cause itching.  Apply moisturizing lotion to your skin as needed. Lotion that contains petroleum jelly is best.  Take medicines or apply medicated creams only as told by your health care provider.  Do not take hot showers or baths. Do not use scented soaps, detergents, perfumes, or cosmetic products. This information is not intended to replace advice given to you by your health care provider. Make sure you discuss any questions you have with your health care provider. Document Released: 11/17/2010 Document Revised: 03/21/2017 Document Reviewed: 03/21/2017 Elsevier Patient Education  2020 Elsevier Inc.  

## 2019-01-15 NOTE — Progress Notes (Signed)
Subjective:  Patient ID: David Barber, male    DOB: 12/24/1950  Age: 68 y.o. MRN: [id number]  CC: Diabetes   HPI David Barber is a 68 year old male with a history of COPD, type 2 diabetes mellitus (A1c 6.8 from 04/2018), hyperlipidemia COPD who presents today forafollow-up of pruritus which he dates back to 5 months ago. This occurs mainly on his trunk and is associated with the presence of a rash. No other family member has similar symptoms and he denies the presence of allergies or change in body products.  He was seen at the ED on 12/29/18 and treated for Scabies with Permethrin. One week ago he was seen by the PA and treated with Prednisone and Hydroxyzine. He is of the opinion that the oral medications were more effective than the topical.  Past Medical History:  Diagnosis Date  . Bronchitis   . COPD (chronic obstructive pulmonary disease) ([facility])   . GERD (gastroesophageal reflux disease)     Past Surgical History:  Procedure Laterality Date  . CYST REMOVAL NECK  2013   benign    Family History  Problem Relation Age of Onset  . Colon cancer Neg Hx     No Known Allergies  Outpatient Medications Prior to Visit  Medication Sig Dispense Refill  . albuterol (PROVENTIL HFA;VENTOLIN HFA) 108 (90 Base) MCG/ACT inhaler Inhale 2 puffs into the lungs every 6 (six) hours as needed for wheezing or shortness of breath. 1 Inhaler 6  . aspirin EC 81 MG tablet Take 1 tablet (81 mg total) by mouth daily. 30 tablet 5  . atorvastatin (LIPITOR) 80 MG tablet Take 1 tablet (80 mg total) by mouth daily. 30 tablet 6  . buPROPion (WELLBUTRIN SR) 150 MG 12 hr tablet Take 1 tablet (150 mg total) by mouth 2 (two) times daily. 60 tablet 3  . cetirizine (ZYRTEC) 10 MG tablet Take 1 tablet (10 mg total) by mouth daily. 30 tablet 11  . diphenhydrAMINE (BENADRYL) 25 MG tablet Take 1 tablet (25 mg total) by mouth every 6 (six) hours as needed. 30 tablet 0  . ergocalciferol (DRISDOL) 50000  units capsule Take 1 capsule (50,000 Units total) by mouth once a week. 9 capsule 1  . Fluticasone-Salmeterol (ADVAIR DISKUS) 250-50 MCG/DOSE AEPB Inhale 1 puff into the lungs 2 (two) times daily. 1 each 6  . gabapentin (NEURONTIN) 300 MG capsule Take 1 capsule (300 mg total) by mouth at bedtime. 30 capsule 6  . hydrOXYzine (ATARAX/VISTARIL) 25 MG tablet Take 1 tablet (25 mg total) by mouth 3 (three) times daily as needed. For itching 60 tablet 0  . loperamide (IMODIUM) 2 MG capsule Take 1 capsule (2 mg total) by mouth 4 (four) times daily as needed for diarrhea or loose stools. 12 capsule 0  . metFORMIN (GLUCOPHAGE XR) 500 MG 24 hr tablet Take 1 tablet (500 mg total) by mouth 2 (two) times daily. 60 tablet 6  . methocarbamol (ROBAXIN) 500 MG tablet Take 1 tablet (500 mg total) by mouth 2 (two) times daily as needed for muscle spasms. 60 tablet 1  . tiotropium (SPIRIVA HANDIHALER) 18 MCG inhalation capsule Place 1 capsule (18 mcg total) into inhaler and inhale daily. 30 capsule 6  . triamcinolone cream (KENALOG) 0.1 % Apply 1 application topically 2 (two) times daily. 45 g 1  . permethrin (ELIMITE) 5 % cream     . predniSONE (DELTASONE) 10 MG tablet 6,5,4,3,2,1 take each days dose at once in the morning with  food 21 tablet 0  . ketotifen (ZADITOR) 0.025 % ophthalmic solution Place 1 drop into both eyes 2 (two) times daily. (Patient not taking: Reported on 04/18/2017) 5 mL 1   No facility-administered medications prior to visit.      ROS Review of Systems  Constitutional: Negative for activity change and appetite change.  HENT: Negative for sinus pressure and sore throat.   Eyes: Negative for visual disturbance.  Respiratory: Negative for cough, chest tightness and shortness of breath.   Cardiovascular: Negative for chest pain and leg swelling.  Gastrointestinal: Negative for abdominal distention, abdominal pain, constipation and diarrhea.  Endocrine: Negative.   Genitourinary: Negative for  dysuria.  Musculoskeletal: Negative for joint swelling and myalgias.  Skin: Positive for rash.  Allergic/Immunologic: Negative.   Neurological: Negative for weakness, light-headedness and numbness.  Psychiatric/Behavioral: Negative for dysphoric mood and suicidal ideas.    Objective:  BP 114/73   Pulse 82   Temp 98.5 F (36.9 C) (Oral)   Ht 5\' 5"  (1.651 m)   Wt 170 lb 3.2 oz (77.2 kg)   SpO2 94%   BMI 28.32 kg/m   BP/Weight 01/15/2019 12/29/2018 0000000  Systolic BP [id number]  Diastolic BP 73 78 83  Wt. (Lbs) 170.2 - 177  BMI 28.32 - 29.45      Physical Exam Constitutional:      Appearance: He is well-developed.  Neck:     Vascular: No JVD.  Cardiovascular:     Rate and Rhythm: Normal rate.     Heart sounds: Normal heart sounds. No murmur.  Pulmonary:     Effort: Pulmonary effort is normal.     Breath sounds: Normal breath sounds. No wheezing or rales.  Chest:     Chest wall: No tenderness.  Abdominal:     General: Bowel sounds are normal. There is no distension.     Palpations: Abdomen is soft. There is no mass.     Tenderness: There is no abdominal tenderness.  Musculoskeletal: Normal range of motion.     Right lower leg: No edema.     Left lower leg: No edema.  Skin:    Comments: Erythematous rash which is a large plaque on upper abdominal wall and rash lower back is pin point and normopigmented. No rash on extremities or face  Neurological:     Mental Status: He is alert and oriented to person, place, and time.  Psychiatric:        Mood and Affect: Mood normal.     CMP Latest Ref Rng & Units 12/04/2018 08/18/2018 05/02/2018  Glucose 65 - 99 mg/dL 79 128(H) 146(H)  BUN 8 - 27 mg/dL 7(L) 18 8  Creatinine 0.76 - 1.27 mg/dL 0.85 1.09 0.86  Sodium 134 - 144 mmol/L 142 137 142  Potassium 3.5 - 5.2 mmol/L 4.4 4.2 4.9  Chloride 96 - 106 mmol/L 104 105 104  CO2 20 - 29 mmol/L 23 21(L) 17(L)  Calcium 8.6 - 10.2 mg/dL 9.7 9.5 9.9  Total Protein 6.5 - 8.1  g/dL - 7.6 7.8  Total Bilirubin 0.3 - 1.2 mg/dL - 0.4 0.3  Alkaline Phos 38 - 126 U/L - 120 129(H)  AST 15 - 41 U/L - 22 18  ALT 0 - 44 U/L - 20 19    Lipid Panel     Component Value Date/Time   CHOL 166 05/02/2018 1028   TRIG 107 05/02/2018 1028   HDL 45 05/02/2018 1028   CHOLHDL 3.7 05/02/2018 1028  CHOLHDL 6.5 (H) 07/21/2015 1625   VLDL 61 (H) 07/21/2015 1625   LDLCALC 100 (H) 05/02/2018 1028    CBC    Component Value Date/Time   WBC 20.8 (H) 08/18/2018 1105   RBC 5.52 08/18/2018 1105   HGB 15.0 08/18/2018 1105   HCT 46.9 08/18/2018 1105   PLT 256 08/18/2018 1105   MCV 85.0 08/18/2018 1105   MCH 27.2 08/18/2018 1105   MCHC 32.0 08/18/2018 1105   RDW 14.7 08/18/2018 1105   LYMPHSABS 2.2 08/18/2018 1105   MONOABS 1.1 (H) 08/18/2018 1105   EOSABS 0.0 08/18/2018 1105   BASOSABS 0.0 08/18/2018 1105    Lab Results  Component Value Date   HGBA1C 6.4 01/15/2019    Assessment & Plan:   1. Controlled type 2 diabetes mellitus without complication, without long-term current use of insulin (HCC) Controlled with A1c of 6.4 - POCT glucose (manual entry) - POCT glycosylated hemoglobin (Hb A1C)  2. Urticaria Unknown etiology given chronicity; unable to identify allergenic component Will refill Permethrin and also place on Prednisone Refer to Dermatology if persisting - predniSONE (DELTASONE) 20 MG tablet; Take 1 tablet (20 mg total) by mouth 2 (two) times daily with a meal.  Dispense: 10 tablet; Refill: 0    Meds ordered this encounter  Medications  . permethrin (ELIMITE) 5 % cream    Sig: Apply topically once for 1 dose. apply from head to toe and leave in for 8-14 hours then wash off.    Dispense:  60 g    Refill:  1  . predniSONE (DELTASONE) 20 MG tablet    Sig: Take 1 tablet (20 mg total) by mouth 2 (two) times daily with a meal.    Dispense:  10 tablet    Refill:  0    Follow-up: Return for Chronic medical conditions, keep previously scheduled appointment.        Charlott Rakes, MD, FAAFP. [facility] [place], [place]   01/15/2019, 11:08 AM

## 2019-01-22 ENCOUNTER — Inpatient Hospital Stay (HOSPITAL_COMMUNITY)
Admission: EM | Admit: 2019-01-22 | Discharge: 2019-01-27 | DRG: 177 | Disposition: A | Discharge status: Home / Self Care | Payer: Medicare Other | Attending: Internal Medicine | Admitting: Internal Medicine

## 2019-01-22 ENCOUNTER — Encounter (HOSPITAL_COMMUNITY): Payer: Self-pay

## 2019-01-22 ENCOUNTER — Other Ambulatory Visit: Payer: Self-pay

## 2019-01-22 ENCOUNTER — Emergency Department (HOSPITAL_COMMUNITY): Payer: Medicare Other

## 2019-01-22 DIAGNOSIS — E1169 Type 2 diabetes mellitus with other specified complication: Secondary | ICD-10-CM | POA: Diagnosis present

## 2019-01-22 DIAGNOSIS — R748 Abnormal levels of other serum enzymes: Secondary | ICD-10-CM | POA: Diagnosis present

## 2019-01-22 DIAGNOSIS — J8410 Pulmonary fibrosis, unspecified: Secondary | ICD-10-CM | POA: Diagnosis present

## 2019-01-22 DIAGNOSIS — Z7984 Long term (current) use of oral hypoglycemic drugs: Secondary | ICD-10-CM | POA: Diagnosis not present

## 2019-01-22 DIAGNOSIS — K219 Gastro-esophageal reflux disease without esophagitis: Secondary | ICD-10-CM | POA: Diagnosis present

## 2019-01-22 DIAGNOSIS — J440 Chronic obstructive pulmonary disease with acute lower respiratory infection: Secondary | ICD-10-CM | POA: Diagnosis present

## 2019-01-22 DIAGNOSIS — E1165 Type 2 diabetes mellitus with hyperglycemia: Secondary | ICD-10-CM | POA: Diagnosis not present

## 2019-01-22 DIAGNOSIS — Z7982 Long term (current) use of aspirin: Secondary | ICD-10-CM | POA: Diagnosis not present

## 2019-01-22 DIAGNOSIS — R0902 Hypoxemia: Secondary | ICD-10-CM

## 2019-01-22 DIAGNOSIS — Z7952 Long term (current) use of systemic steroids: Secondary | ICD-10-CM

## 2019-01-22 DIAGNOSIS — U071 COVID-19: Secondary | ICD-10-CM | POA: Diagnosis not present

## 2019-01-22 DIAGNOSIS — E1149 Type 2 diabetes mellitus with other diabetic neurological complication: Secondary | ICD-10-CM | POA: Diagnosis not present

## 2019-01-22 DIAGNOSIS — J1289 Other viral pneumonia: Secondary | ICD-10-CM | POA: Diagnosis present

## 2019-01-22 DIAGNOSIS — Z7951 Long term (current) use of inhaled steroids: Secondary | ICD-10-CM

## 2019-01-22 DIAGNOSIS — Z9981 Dependence on supplemental oxygen: Secondary | ICD-10-CM

## 2019-01-22 DIAGNOSIS — E1140 Type 2 diabetes mellitus with diabetic neuropathy, unspecified: Secondary | ICD-10-CM | POA: Diagnosis present

## 2019-01-22 DIAGNOSIS — E119 Type 2 diabetes mellitus without complications: Secondary | ICD-10-CM | POA: Diagnosis not present

## 2019-01-22 DIAGNOSIS — J1282 Pneumonia due to coronavirus disease 2019: Secondary | ICD-10-CM | POA: Diagnosis present

## 2019-01-22 DIAGNOSIS — Z87891 Personal history of nicotine dependence: Secondary | ICD-10-CM

## 2019-01-22 DIAGNOSIS — I10 Essential (primary) hypertension: Secondary | ICD-10-CM | POA: Diagnosis present

## 2019-01-22 DIAGNOSIS — E785 Hyperlipidemia, unspecified: Secondary | ICD-10-CM | POA: Diagnosis present

## 2019-01-22 DIAGNOSIS — T380X5A Adverse effect of glucocorticoids and synthetic analogues, initial encounter: Secondary | ICD-10-CM | POA: Diagnosis not present

## 2019-01-22 DIAGNOSIS — J449 Chronic obstructive pulmonary disease, unspecified: Secondary | ICD-10-CM | POA: Diagnosis not present

## 2019-01-22 HISTORY — DX: Essential (primary) hypertension: I10

## 2019-01-22 HISTORY — DX: Type 2 diabetes mellitus without complications: E11.9

## 2019-01-22 LAB — COMPREHENSIVE METABOLIC PANEL
ALT: 48 U/L — ABNORMAL HIGH (ref 0–44)
AST: 37 U/L (ref 15–41)
Albumin: 3.5 g/dL (ref 3.5–5.0)
Alkaline Phosphatase: 104 U/L (ref 38–126)
Anion gap: 9 (ref 5–15)
BUN: 17 mg/dL (ref 8–23)
CO2: 26 mmol/L (ref 22–32)
Calcium: 8.6 mg/dL — ABNORMAL LOW (ref 8.9–10.3)
Chloride: 102 mmol/L (ref 98–111)
Creatinine, Ser: 1.11 mg/dL (ref 0.61–1.24)
GFR calc Af Amer: >60 mL/min (ref >60)
GFR calc non Af Amer: >60 mL/min (ref >60)
Glucose, Bld: 93 mg/dL (ref 70–99)
Potassium: 3.9 mmol/L (ref 3.5–5.1)
Sodium: 137 mmol/L (ref 135–145)
Total Bilirubin: 1 mg/dL (ref 0.3–1.2)
Total Protein: 7.3 g/dL (ref 6.5–8.1)

## 2019-01-22 LAB — URINALYSIS, ROUTINE W REFLEX MICROSCOPIC
Bilirubin Urine: NEGATIVE
Glucose, UA: NEGATIVE mg/dL
Hgb urine dipstick: NEGATIVE
Ketones, ur: NEGATIVE mg/dL
Leukocytes,Ua: NEGATIVE
Nitrite: NEGATIVE
Protein, ur: NEGATIVE mg/dL
Specific Gravity, Urine: 1.017 (ref 1.005–1.030)
pH: 5 (ref 5.0–8.0)

## 2019-01-22 LAB — FERRITIN: Ferritin: 674 ng/mL — ABNORMAL HIGH (ref 24–336)

## 2019-01-22 LAB — SARS CORONAVIRUS 2 BY RT PCR (HOSPITAL ORDER, PERFORMED IN ~~LOC~~ HOSPITAL LAB): SARS Coronavirus 2: POSITIVE — AB

## 2019-01-22 LAB — CBC WITH DIFFERENTIAL/PLATELET
Abs Immature Granulocytes: 0.06 10*3/uL (ref 0.00–0.07)
Basophils Absolute: 0 10*3/uL (ref 0.0–0.1)
Basophils Relative: 0 %
Eosinophils Absolute: 0 10*3/uL (ref 0.0–0.5)
Eosinophils Relative: 0 %
HCT: 48.8 % (ref 39.0–52.0)
Hemoglobin: 15.6 g/dL (ref 13.0–17.0)
Immature Granulocytes: 1 %
Lymphocytes Relative: 13 %
Lymphs Abs: 1.3 10*3/uL (ref 0.7–4.0)
MCH: 27.6 pg (ref 26.0–34.0)
MCHC: 32 g/dL (ref 30.0–36.0)
MCV: 86.2 fL (ref 80.0–100.0)
Monocytes Absolute: 0.4 10*3/uL (ref 0.1–1.0)
Monocytes Relative: 4 %
Neutro Abs: 8.3 10*3/uL — ABNORMAL HIGH (ref 1.7–7.7)
Neutrophils Relative %: 82 %
Platelets: 160 10*3/uL (ref 150–400)
RBC: 5.66 MIL/uL (ref 4.22–5.81)
RDW: 15.6 % — ABNORMAL HIGH (ref 11.5–15.5)
WBC: 10 10*3/uL (ref 4.0–10.5)
nRBC: 0 % (ref 0.0–0.2)

## 2019-01-22 LAB — LACTIC ACID, PLASMA: Lactic Acid, Venous: 1 mmol/L (ref 0.5–1.9)

## 2019-01-22 LAB — D-DIMER, QUANTITATIVE: D-Dimer, Quant: 0.35 ug/mL-FEU (ref 0.00–0.50)

## 2019-01-22 LAB — PROCALCITONIN: Procalcitonin: 0.12 ng/mL

## 2019-01-22 LAB — TRIGLYCERIDES: Triglycerides: 161 mg/dL — ABNORMAL HIGH (ref <150)

## 2019-01-22 LAB — FIBRINOGEN: Fibrinogen: 659 mg/dL — ABNORMAL HIGH (ref 210–475)

## 2019-01-22 LAB — C-REACTIVE PROTEIN: CRP: 9.1 mg/dL — ABNORMAL HIGH (ref <1.0)

## 2019-01-22 LAB — GLUCOSE, CAPILLARY: Glucose-Capillary: 124 mg/dL — ABNORMAL HIGH (ref 70–99)

## 2019-01-22 LAB — TROPONIN I (HIGH SENSITIVITY)
Troponin I (High Sensitivity): 5 ng/L (ref <18)
Troponin I (High Sensitivity): 4 ng/L (ref <18)

## 2019-01-22 LAB — LACTATE DEHYDROGENASE: LDH: 186 U/L (ref 98–192)

## 2019-01-22 MED ORDER — SODIUM CHLORIDE 0.9% FLUSH
3.0000 mL | Freq: Once | INTRAVENOUS | Status: AC
  Administered 2019-01-22: 3 mL via INTRAVENOUS

## 2019-01-22 MED ORDER — ATORVASTATIN CALCIUM 40 MG PO TABS
80.0000 mg | ORAL_TABLET | Freq: Every day | ORAL | Status: DC
  Administered 2019-01-23 – 2019-01-27 (×5): 80 mg via ORAL
  Filled 2019-01-22 (×2): qty 2
  Filled 2019-01-22: qty 8
  Filled 2019-01-22 (×2): qty 2

## 2019-01-22 MED ORDER — DEXAMETHASONE SODIUM PHOSPHATE 10 MG/ML IJ SOLN
10.0000 mg | Freq: Once | INTRAMUSCULAR | Status: AC
  Administered 2019-01-22: 10 mg via INTRAVENOUS
  Filled 2019-01-22: qty 1

## 2019-01-22 MED ORDER — DEXAMETHASONE SODIUM PHOSPHATE 10 MG/ML IJ SOLN
6.0000 mg | INTRAMUSCULAR | Status: DC
  Administered 2019-01-23 – 2019-01-27 (×5): 6 mg via INTRAVENOUS
  Filled 2019-01-22 (×6): qty 1

## 2019-01-22 MED ORDER — SODIUM CHLORIDE 0.9 % IV BOLUS
1000.0000 mL | Freq: Once | INTRAVENOUS | Status: AC
  Administered 2019-01-22: 1000 mL via INTRAVENOUS

## 2019-01-22 MED ORDER — ENOXAPARIN SODIUM 40 MG/0.4ML ~~LOC~~ SOLN
40.0000 mg | SUBCUTANEOUS | Status: DC
  Administered 2019-01-22 – 2019-01-26 (×5): 40 mg via SUBCUTANEOUS
  Filled 2019-01-22 (×5): qty 0.4

## 2019-01-22 MED ORDER — TIOTROPIUM BROMIDE MONOHYDRATE 18 MCG IN CAPS
18.0000 ug | ORAL_CAPSULE | Freq: Every day | RESPIRATORY_TRACT | Status: DC
  Administered 2019-01-23 – 2019-01-27 (×5): 18 ug via RESPIRATORY_TRACT
  Filled 2019-01-22: qty 5

## 2019-01-22 MED ORDER — INSULIN ASPART 100 UNIT/ML ~~LOC~~ SOLN
0.0000 [IU] | Freq: Three times a day (TID) | SUBCUTANEOUS | Status: DC
  Administered 2019-01-23 – 2019-01-24 (×5): 1 [IU] via SUBCUTANEOUS
  Administered 2019-01-25: 2 [IU] via SUBCUTANEOUS
  Administered 2019-01-25: 1 [IU] via SUBCUTANEOUS
  Administered 2019-01-26: 2 [IU] via SUBCUTANEOUS

## 2019-01-22 MED ORDER — FLUTICASONE FUROATE-VILANTEROL 200-25 MCG/INH IN AEPB
1.0000 | INHALATION_SPRAY | Freq: Every day | RESPIRATORY_TRACT | Status: DC
  Administered 2019-01-23 – 2019-01-27 (×5): 1 via RESPIRATORY_TRACT
  Filled 2019-01-22: qty 28

## 2019-01-22 NOTE — ED Notes (Signed)
Placed [place] back on patient at 3 litters a 92%. Patient removing EKG cords. Requested patient sit up in bed to help 02. Patient wants to lay flat.   Used interpreter to explain to patient he is getting transported to [facility]. Patient verbalizes understanding.

## 2019-01-22 NOTE — ED Notes (Signed)
Report given to [facility], [place], RN .

## 2019-01-22 NOTE — ED Notes (Signed)
Pt ambulated around room. Denied SOB and dizziness. O2 sats remained 90-94% on RA

## 2019-01-22 NOTE — ED Provider Notes (Signed)
[facility] DEPT Provider Note   CSN: [id number] Arrival date & time: 01/22/19  1130     History   Chief Complaint Chief Complaint  Patient presents with  . Weakness    HPI David Barber is a 68 y.o. male.     Level 5 caveat for language barrier.  [place] translator is used.  Patient presents with a 1 day history of headache, dizziness, fever and generalized weakness.  EMS gave him 500 cc of fluid and 1000 mg of acetaminophen.  His wife is reportedly coronavirus positive and tested positive yesterday.  Patient himself has a test pending.  He denies any cough, chest pain or shortness of breath.  He denies any abdominal pain, nausea or vomiting.  Complains of headache, lightheadedness and dizziness and generalized weakness and aching all over.  No documented falls or trauma. Patient does apparently have COPD and wears oxygen at home intermittently.  The history is provided by the patient and the EMS personnel. The history is limited by the condition of the patient and a language barrier. A language interpreter was used.  Weakness Associated symptoms: arthralgias, dizziness, fever, headaches and myalgias   Associated symptoms: no abdominal pain, no chest pain, no cough, no dysuria, no nausea, no shortness of breath and no vomiting     Past Medical History:  Diagnosis Date  . Bronchitis   . COPD (chronic obstructive pulmonary disease) ([place])   . Diabetes mellitus without complication ([place])   . GERD (gastroesophageal reflux disease)   . Hypertension     Patient Active Problem List   Diagnosis Date Noted  . Diabetic neuropathy ([place]) 01/22/2018  . Hyperlipidemia associated with type 2 diabetes mellitus ([place]) 07/29/2015  . Hearing loss 07/29/2015  . Vitamin D deficiency 05/15/2015  . Chronic low back pain 02/11/2015  . COPD, mild ([place]) 11/17/2014  . Chewing tobacco use 11/17/2014  . Chest pain 06/18/2014  . GERD (gastroesophageal reflux  disease) 06/18/2014  . Nonspecific abnormal electrocardiogram (ECG) (EKG) 06/16/2014  . Diabetes type 2, controlled ([place]) 12/21/2012    Past Surgical History:  Procedure Laterality Date  . CYST REMOVAL NECK  2013   benign        Home Medications    Prior to Admission medications   Medication Sig Start Date End Date Taking? Authorizing Provider  albuterol (PROVENTIL HFA;VENTOLIN HFA) 108 (90 Base) MCG/ACT inhaler Inhale 2 puffs into the lungs every 6 (six) hours as needed for wheezing or shortness of breath. 04/30/18   Charlott Rakes, MD  aspirin EC 81 MG tablet Take 1 tablet (81 mg total) by mouth daily. 11/15/17   Argentina Donovan, PA-C  atorvastatin (LIPITOR) 80 MG tablet Take 1 tablet (80 mg total) by mouth daily. 12/03/18   Charlott Rakes, MD  buPROPion (WELLBUTRIN SR) 150 MG 12 hr tablet Take 1 tablet (150 mg total) by mouth 2 (two) times daily. 05/22/18   Charlott Rakes, MD  cetirizine (ZYRTEC) 10 MG tablet Take 1 tablet (10 mg total) by mouth daily. 08/16/18   Argentina Donovan, PA-C  diphenhydrAMINE (BENADRYL) 25 MG tablet Take 1 tablet (25 mg total) by mouth every 6 (six) hours as needed. 12/29/18   Loura Halt A, NP  ergocalciferol (DRISDOL) 50000 units capsule Take 1 capsule (50,000 Units total) by mouth once a week. 01/23/18   Charlott Rakes, MD  Fluticasone-Salmeterol (ADVAIR DISKUS) 250-50 MCG/DOSE AEPB Inhale 1 puff into the lungs 2 (two) times daily. 12/03/18   Charlott Rakes, MD  gabapentin (NEURONTIN) 300 MG capsule Take 1 capsule (300 mg total) by mouth at bedtime. 12/03/18   Charlott Rakes, MD  hydrOXYzine (ATARAX/VISTARIL) 25 MG tablet Take 1 tablet (25 mg total) by mouth 3 (three) times daily as needed. For itching 01/10/19   Argentina Donovan, PA-C  ketotifen (ZADITOR) 0.025 % ophthalmic solution Place 1 drop into both eyes 2 (two) times daily. Patient not taking: Reported on 04/18/2017 02/15/17   Argentina Donovan, PA-C  loperamide (IMODIUM) 2 MG capsule Take 1  capsule (2 mg total) by mouth 4 (four) times daily as needed for diarrhea or loose stools. 08/18/18   Varney Biles, MD  metFORMIN (GLUCOPHAGE XR) 500 MG 24 hr tablet Take 1 tablet (500 mg total) by mouth 2 (two) times daily. 12/03/18   Charlott Rakes, MD  methocarbamol (ROBAXIN) 500 MG tablet Take 1 tablet (500 mg total) by mouth 2 (two) times daily as needed for muscle spasms. 01/22/18   Charlott Rakes, MD  predniSONE (DELTASONE) 20 MG tablet Take 1 tablet (20 mg total) by mouth 2 (two) times daily with a meal. 01/15/19   Charlott Rakes, MD  tiotropium (SPIRIVA HANDIHALER) 18 MCG inhalation capsule Place 1 capsule (18 mcg total) into inhaler and inhale daily. 12/03/18   Charlott Rakes, MD  triamcinolone cream (KENALOG) 0.1 % Apply 1 application topically 2 (two) times daily. 04/30/18   Charlott Rakes, MD    Family History Family History  Problem Relation Age of Onset  . Colon cancer Neg Hx     Social History Social History   Tobacco Use  . Smoking status: Former Smoker    Types: Cigarettes    Quit date: 10/19/2013    Years since quitting: 5.2  . Smokeless tobacco: Current User  . Tobacco comment: chewing tobacco  Substance Use Topics  . Alcohol use: Yes    Alcohol/week: 3.0 standard drinks    Types: 3 Cans of beer per week  . Drug use: No     Allergies   Patient has no known allergies.   Review of Systems Review of Systems  Constitutional: Positive for activity change, appetite change, fatigue and fever.  HENT: Negative for congestion and rhinorrhea.   Eyes: Negative for visual disturbance.  Respiratory: Negative for cough, chest tightness and shortness of breath.   Cardiovascular: Negative for chest pain.  Gastrointestinal: Negative for abdominal pain, nausea and vomiting.  Genitourinary: Negative for dysuria and hematuria.  Musculoskeletal: Positive for arthralgias and myalgias.  Skin: Negative for rash.  Neurological: Positive for dizziness, weakness,  light-headedness and headaches.   all other systems are negative except as noted in the HPI and PMH.     Physical Exam Updated Vital Signs BP 108/76   Pulse (!) 109   Temp 99.1 F (37.3 C)   Resp (!) 22   SpO2 93%   Physical Exam Vitals signs and nursing note reviewed.  Constitutional:      General: He is not in acute distress.    Appearance: Normal appearance. He is well-developed and normal weight.     Comments: No increased work of breathing, speaking full sentences  HENT:     Head: Normocephalic and atraumatic.     Mouth/Throat:     Pharynx: No oropharyngeal exudate.  Eyes:     Conjunctiva/sclera: Conjunctivae normal.     Pupils: Pupils are equal, round, and reactive to light.  Neck:     Musculoskeletal: Normal range of motion and neck supple.     Comments: No  meningismus. Cardiovascular:     Rate and Rhythm: Regular rhythm. Tachycardia present.     Heart sounds: Normal heart sounds. No murmur.  Pulmonary:     Effort: Pulmonary effort is normal. No respiratory distress.     Breath sounds: Normal breath sounds.  Chest:     Chest wall: No tenderness.  Abdominal:     Palpations: Abdomen is soft.     Tenderness: There is no abdominal tenderness. There is no guarding or rebound.  Musculoskeletal: Normal range of motion.        General: No tenderness.  Skin:    General: Skin is warm.     Capillary Refill: Capillary refill takes less than 2 seconds.  Neurological:     General: No focal deficit present.     Mental Status: He is alert and oriented to person, place, and time. Mental status is at baseline.     Cranial Nerves: No cranial nerve deficit.     Motor: No abnormal muscle tone.     Coordination: Coordination normal.     Comments: No ataxia on finger to nose bilaterally. No pronator drift. 5/5 strength throughout. CN 2-12 intact.Equal grip strength. Sensation intact.   Psychiatric:        Behavior: Behavior normal.      ED Treatments / Results  Labs (all  labs ordered are listed, but only abnormal results are displayed) Labs Reviewed  SARS CORONAVIRUS 2 BY RT PCR (HOSPITAL ORDER, [facility]) - Abnormal; Notable for the following components:      Result Value   SARS Coronavirus 2 POSITIVE (*)    All other components within normal limits  COMPREHENSIVE METABOLIC PANEL - Abnormal; Notable for the following components:   Calcium 8.6 (*)    ALT 48 (*)    All other components within normal limits  CBC WITH DIFFERENTIAL/PLATELET - Abnormal; Notable for the following components:   RDW 15.6 (*)    Neutro Abs 8.3 (*)    All other components within normal limits  FERRITIN - Abnormal; Notable for the following components:   Ferritin 674 (*)    All other components within normal limits  TRIGLYCERIDES - Abnormal; Notable for the following components:   Triglycerides 161 (*)    All other components within normal limits  FIBRINOGEN - Abnormal; Notable for the following components:   Fibrinogen 659 (*)    All other components within normal limits  C-REACTIVE PROTEIN - Abnormal; Notable for the following components:   CRP 9.1 (*)    All other components within normal limits  CULTURE, BLOOD (ROUTINE X 2)  CULTURE, BLOOD (ROUTINE X 2)  LACTIC ACID, PLASMA  URINALYSIS, ROUTINE W REFLEX MICROSCOPIC  D-DIMER, QUANTITATIVE (NOT AT [facility])  PROCALCITONIN  LACTATE DEHYDROGENASE  LACTIC ACID, PLASMA  TROPONIN I (HIGH SENSITIVITY)  TROPONIN I (HIGH SENSITIVITY)    EKG EKG Interpretation  Date/Time:  Tuesday January 22 2019 12:41:29 EST Ventricular Rate:  109 PR Interval:    QRS Duration: 76 QT Interval:  285 QTC Calculation: 384 R Axis:   42 Text Interpretation: Sinus tachycardia No significant change was found Confirmed by Ezequiel Essex [id number]) on 01/22/2019 12:54:40 PM   Radiology Dg Chest Port 1 View  Result Date: 01/22/2019 CLINICAL DATA:  Shortness of breath with cough and fever. EXAM: PORTABLE CHEST 1 VIEW  COMPARISON:  Aug 18, 2018 FINDINGS: There is patchy airspace opacity in the right mid lung as well as in the left mid lower lung  zones. There is a degree of underlying fibrosis in the bases. Heart is upper normal in size with pulmonary vascularity normal. No adenopathy. No bone lesions IMPRESSION: Patchy airspace opacity superimposed on areas of fibrosis. Suspect atypical organism superimposed on chronic lung disease. Heart size is upper normal, stable.  No evident adenopathy. Electronically Signed   By: Lowella Grip III M.D.   On: 01/22/2019 13:24    Procedures Procedures (including critical care time)  Medications Ordered in ED Medications  sodium chloride 0.9 % bolus 1,000 mL (has no administration in time range)  sodium chloride flush (NS) 0.9 % injection 3 mL (3 mLs Intravenous Given 01/22/19 1214)     Initial Impression / Assessment and Plan / ED Course  I have reviewed the triage vital signs and the nursing notes.  Pertinent labs & imaging results that were available during my care of the patient were reviewed by me and considered in my medical decision making (see chart for details).       Weakness, fever, cough with coronavirus exposure at home.  No respiratory distress.  CXR with airspace opacities concerning for COVID.  Mild elevation of inflammatory markers.  Tachycardic and RR elevated. Gentle IVF given.  D-dimer negative. Doubt PE.  New O2 requirement with desaturations to 86% on RA. Will plan admission with new O2 requirement and start steroids.  D/w Dr. Marthenia Rolling.   Decameron [place] was evaluated in Emergency Department on 01/22/2019 for the symptoms described in the history of present illness. He was evaluated in the context of the global COVID-19 pandemic, which necessitated consideration that the patient might be at risk for infection with the SARS-CoV-2 virus that causes COVID-19. Institutional protocols and algorithms that pertain to the evaluation of patients  at risk for COVID-19 are in a state of rapid change based on information released by regulatory bodies including the CDC and federal and state organizations. These policies and algorithms were followed during the patient's care in the ED.  Final Clinical Impressions(s) / ED Diagnoses   Final diagnoses:  COVID-19 virus infection  Hypoxia    ED Discharge Orders    None       Susana Duell, Annie Main, MD 01/22/19 1857

## 2019-01-22 NOTE — ED Triage Notes (Signed)
Pt arrived via [facility] from home CC weakness, fever, cough onset this morning. Pt took 1000mg  tylenol today. 543ml NS given in route 20G left wrist   Pts Wife COVID POSITIVE. Pt tested on Monday results pending.   Hx. Asthma Pt primary language is [place] pt can understand some english

## 2019-01-22 NOTE — ED Notes (Signed)
[facility] has been dispatched. ETA 2 hours.

## 2019-01-22 NOTE — ED Notes (Signed)
Patient keeps taking 02 out of nose. Requested patient place [place] back in nose. Patient states he wants to sleep.

## 2019-01-22 NOTE — H&P (Signed)
History and Physical  David Barber [id number] DOB: Feb 08, 1951 DOA: 01/22/2019  Referring physician: ER provider PCP: Charlott Rakes, MD  Outpatient Specialists:    Patient coming from: Home  Chief Complaint: Shortness of breath and fever.  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  Patient is a poor historian.  Patient presents with 2-day history of fever, worsening shortness of breath, headache, generalized weakness and dizziness.  On presentation to the hospital, chest x-ray done revealed patchy airspace opacities superimposed on areas of fibrosis.  COVID-19 test came back positive.  Triglyceride is mildly elevated, ferritin is elevated, CRP is elevated, procalcitonin is 0.12.  Patient be admitted for further assessment and management.  ED Course: On presentation to the ER, vitals revealed temperature of 99.7, heart rate of 107, blood pressure 102/44, respiratory rate of 25 bpm and O2 sat of 86% on room air.  ECG revealed WBC of 10, hemoglobin 15.6, hematocrit of 40.8, MCV of 86.2 platelet count of 160.  Chemistry revealed sodium of 137, potassium of 3.9, chloride 102, CO2 26, BUN of 17, creatinine of 1.11 with blood sugar of 93.  COVID-19 test is positive.  Chest x-ray is as documented above.  Pertinent labs: As documented above.  EKG: Independently reviewed.  Sinus tachycardia noted.  Imaging: independently reviewed.   Review of Systems: Patient is a poor historian.  Negative for visual changes, rash, chest pain, dysuria, bleeding, n/v/abdominal pain.  Past Medical History:  Diagnosis Date  . Bronchitis   . COPD (chronic obstructive pulmonary disease) ([place])   . Diabetes mellitus without complication ([place])   . GERD (gastroesophageal reflux disease)   . Hypertension     Past Surgical History:  Procedure Laterality Date  . CYST REMOVAL NECK  2013   benign     reports that he quit smoking about 5 years ago. His smoking use included cigarettes. He uses smokeless tobacco. He reports current alcohol use of about 3.0 standard drinks of alcohol per week. He reports that he does not use drugs.  No Known Allergies  Family History  Problem Relation Age of Onset  . Colon cancer Neg Hx      Prior to Admission medications   Medication Sig Start Date End Date Taking? Authorizing Provider  albuterol (PROVENTIL HFA;VENTOLIN HFA) 108 (90 Base) MCG/ACT inhaler Inhale 2 puffs into the lungs every 6 (six) hours as needed for wheezing or shortness of breath. 04/30/18   Charlott Rakes, MD  aspirin EC 81 MG tablet Take 1 tablet (81 mg total) by mouth daily. 11/15/17   Argentina Donovan, PA-C  atorvastatin (LIPITOR) 80 MG tablet Take 1 tablet (80 mg total) by mouth daily. 12/03/18   Charlott Rakes, MD  buPROPion (WELLBUTRIN SR) 150 MG 12 hr tablet Take 1 tablet (150 mg total) by mouth 2 (two) times daily. 05/22/18   Charlott Rakes, MD  cetirizine (ZYRTEC) 10 MG tablet Take 1 tablet (10 mg total) by mouth daily. 08/16/18   Argentina Donovan, PA-C  diphenhydrAMINE (BENADRYL) 25 MG tablet Take 1 tablet (25 mg total) by mouth every 6 (six) hours as needed. 12/29/18   Loura Halt A, NP  ergocalciferol (DRISDOL) 50000 units capsule Take 1 capsule (50,000 Units total) by mouth once a week. 01/23/18   Charlott Rakes, MD  Fluticasone-Salmeterol (ADVAIR DISKUS) 250-50 MCG/DOSE AEPB Inhale 1 puff into the lungs 2 (two) times daily. 12/03/18   Newlin, Charlane Ferretti,  MD  gabapentin (NEURONTIN) 300 MG capsule Take 1 capsule (300 mg total) by mouth at bedtime. 12/03/18   Charlott Rakes, MD  hydrOXYzine (ATARAX/VISTARIL) 25 MG tablet Take 1 tablet (25 mg total) by mouth 3 (three) times daily as needed. For itching 01/10/19   Argentina Donovan, PA-C  ketotifen (ZADITOR) 0.025 % ophthalmic solution Place 1 drop into both eyes 2 (two) times daily. Patient not taking: Reported on 04/18/2017  02/15/17   Argentina Donovan, PA-C  loperamide (IMODIUM) 2 MG capsule Take 1 capsule (2 mg total) by mouth 4 (four) times daily as needed for diarrhea or loose stools. 08/18/18   Varney Biles, MD  metFORMIN (GLUCOPHAGE XR) 500 MG 24 hr tablet Take 1 tablet (500 mg total) by mouth 2 (two) times daily. 12/03/18   Charlott Rakes, MD  methocarbamol (ROBAXIN) 500 MG tablet Take 1 tablet (500 mg total) by mouth 2 (two) times daily as needed for muscle spasms. 01/22/18   Charlott Rakes, MD  predniSONE (DELTASONE) 20 MG tablet Take 1 tablet (20 mg total) by mouth 2 (two) times daily with a meal. 01/15/19   Charlott Rakes, MD  tiotropium (SPIRIVA HANDIHALER) 18 MCG inhalation capsule Place 1 capsule (18 mcg total) into inhaler and inhale daily. 12/03/18   Charlott Rakes, MD  triamcinolone cream (KENALOG) 0.1 % Apply 1 application topically 2 (two) times daily. 04/30/18   Charlott Rakes, MD    Physical Exam: Vitals:   01/22/19 1506 01/22/19 1530 01/22/19 1600 01/22/19 1630  BP: 102/62 121/64 124/82 (!) 102/44  Pulse: (!) 107 (!) 102 (!) 105 (!) 107  Resp: 19 (!) 31 18 (!) 25  Temp:      TempSrc:      SpO2: 95% 99% 96% (!) 86%    Constitutional:  . Appears calm and comfortable Eyes:  . No pallor. No jaundice.  ENMT:  . external ears, nose appear normal Neck:  . Neck is supple. No JVD Respiratory:  . Decreased air entry. Cardiovascular:  . S1S2 . No LE extremity edema   Abdomen:  . Abdomen is soft and non tender. Organs are difficult to assess. Neurologic:  . Awake and alert. . Moves all limbs.  Wt Readings from Last 3 Encounters:  01/15/19 77.2 kg  08/18/18 80.3 kg  08/16/18 81.6 kg    I have personally reviewed following labs and imaging studies  Labs on Admission:  CBC: Recent Labs  Lab 01/22/19 1200  WBC 10.0  NEUTROABS 8.3*  HGB 15.6  HCT 48.8  MCV 86.2  PLT 0000000   Basic Metabolic Panel: Recent Labs  Lab 01/22/19 1200  NA 137  K 3.9  CL 102  CO2 26   GLUCOSE 93  BUN 17  CREATININE 1.11  CALCIUM 8.6*   Liver Function Tests: Recent Labs  Lab 01/22/19 1200  AST 37  ALT 48*  ALKPHOS 104  BILITOT 1.0  PROT 7.3  ALBUMIN 3.5   No results for input(s): LIPASE, AMYLASE in the last 168 hours. No results for input(s): AMMONIA in the last 168 hours. Coagulation Profile: No results for input(s): INR, PROTIME in the last 168 hours. Cardiac Enzymes: No results for input(s): CKTOTAL, CKMB, CKMBINDEX, TROPONINI in the last 168 hours. BNP (last 3 results) No results for input(s): PROBNP in the last 8760 hours. HbA1C: No results for input(s): HGBA1C in the last 72 hours. CBG: No results for input(s): GLUCAP in the last 168 hours. Lipid Profile: Recent Labs    01/22/19 1200  TRIG 161*   Thyroid Function Tests: No results for input(s): TSH, T4TOTAL, FREET4, T3FREE, THYROIDAB in the last 72 hours. Anemia Panel: Recent Labs    01/22/19 1200  FERRITIN 674*   Urine analysis:    Component Value Date/Time   COLORURINE YELLOW 01/22/2019 1200   APPEARANCEUR CLEAR 01/22/2019 1200   LABSPEC 1.017 01/22/2019 1200   PHURINE 5.0 01/22/2019 1200   GLUCOSEU NEGATIVE 01/22/2019 1200   HGBUR NEGATIVE 01/22/2019 1200   BILIRUBINUR NEGATIVE 01/22/2019 1200   KETONESUR NEGATIVE 01/22/2019 1200   PROTEINUR NEGATIVE 01/22/2019 1200   UROBILINOGEN 0.2 11/23/2012 1355   NITRITE NEGATIVE 01/22/2019 1200   LEUKOCYTESUR NEGATIVE 01/22/2019 1200   Sepsis Labs: @LABRCNTIP (procalcitonin:4,lacticidven:4) ) Recent Results (from the past 240 hour(s))  SARS Coronavirus 2 by RT PCR (hospital order, performed in [place] hospital lab) Nasopharyngeal Nasopharyngeal Swab     Status: Abnormal   Collection Time: 01/22/19 12:15 PM   Specimen: Nasopharyngeal Swab  Result Value Ref Range Status   SARS Coronavirus 2 POSITIVE (A) NEGATIVE Final    Comment: CRITICAL RESULT CALLED TO, READ BACK BY AND VERIFIED WITH: PATTY DOWD,RN [id number] @ [facility]  (NOTE) If result is NEGATIVE SARS-CoV-2 target nucleic acids are NOT DETECTED. The SARS-CoV-2 RNA is generally detectable in upper and lower  respiratory specimens during the acute phase of infection. The lowest  concentration of SARS-CoV-2 viral copies this assay can detect is 250  copies / mL. A negative result does not preclude SARS-CoV-2 infection  and should not be used as the sole basis for treatment or other  patient management decisions.  A negative result may occur with  improper specimen collection / handling, submission of specimen other  than nasopharyngeal swab, presence of viral mutation(s) within the  areas targeted by this assay, and inadequate number of viral copies  (<250 copies / mL). A negative result must be combined with clinical  observations, patient history, and epidemiological information. If result is POSITIVE SARS-CoV-2 target nucleic acids ar e DETECTED. The SARS-CoV-2 RNA is generally detectable in upper and lower  respiratory specimens during the acute phase of infection.  Positive  results are indicative of active infection with SARS-CoV-2.  Clinical  correlation with patient history and other diagnostic information is  necessary to determine patient infection status.  Positive results do  not rule out bacterial infection or co-infection with other viruses. If result is PRESUMPTIVE POSTIVE SARS-CoV-2 nucleic acids MAY BE PRESENT.   A presumptive positive result was obtained on the submitted specimen  and confirmed on repeat testing.  While 2019 novel coronavirus  (SARS-CoV-2) nucleic acids may be present in the submitted sample  additional confirmatory testing may be necessary for epidemiological  and / or clinical management purposes  to differentiate between  SARS-CoV-2 and other Sarbecovirus currently known to infect humans.  If clinically indicated additional testing with an alternate test  methodolog y [phone]) is advised. The SARS-CoV-2 RNA is  generally  detectable in upper and lower respiratory specimens during the acute  phase of infection. The expected result is Negative. Fact Sheet for Patients:  StrictlyIdeas.no Fact Sheet for Healthcare Providers: BankingDealers.co.za This test is not yet approved or cleared by the [place] FDA and has been authorized for detection and/or diagnosis of SARS-CoV-2 by FDA under an Emergency Use Authorization (EUA).  This EUA will remain in effect (meaning this test can be used) for the duration of the COVID-19 declaration under Section 564(b)(1) of the Act, 21 U.S.C. section  360bbb-3(b)(1), unless the authorization is terminated or revoked sooner. Performed at [facility], [place] [address], [place], [place] [address]       Radiological Exams on Admission: Dg Chest Port 1 View  Result Date: 01/22/2019 CLINICAL DATA:  Shortness of breath with cough and fever. EXAM: PORTABLE CHEST 1 VIEW COMPARISON:  Aug 18, 2018 FINDINGS: There is patchy airspace opacity in the right mid lung as well as in the left mid lower lung zones. There is a degree of underlying fibrosis in the bases. Heart is upper normal in size with pulmonary vascularity normal. No adenopathy. No bone lesions IMPRESSION: Patchy airspace opacity superimposed on areas of fibrosis. Suspect atypical organism superimposed on chronic lung disease. Heart size is upper normal, stable.  No evident adenopathy. Electronically Signed   By: Lowella Grip III M.D.   On: 01/22/2019 13:24    EKG: Independently reviewed.   Active Problems:   Pneumonia due to severe acute respiratory syndrome coronavirus 2 (SARS-CoV-2)   Assessment/Plan Pneumonia due to severe acute respiratory syndrome coronavirus 2 (SARS-CoV-2): -Patient for further assessment and management. -Start patient on Decadron. -Check inflammatory markers daily -Low threshold to introduce remdesivir.  -Supportive care. -Further management depend on hospital course.  History of COPD: Continue inhalers. Steroids as above For the management depend on hospital course.  DVT prophylaxis: Subcutaneous Lovenox Code Status: Full code Family Communication:  Disposition Plan: Home eventually Consults called: None Admission status: Inpatient  Time spent: 65 minutes  Dana Allan, MD  [facility] Pager #: [phone] 7PM-7AM contact night coverage as above  01/22/2019, 4:34 PM

## 2019-01-22 NOTE — ED Notes (Signed)
Date and time results received: 01/22/19 1:48 PM  (use smartphrase ".now" to insert current time)  Test: Covid  Critical Value: Positive  Name of Provider Notified: Logan RN   Orders Received? Or Actions Taken?:

## 2019-01-22 NOTE — ED Notes (Signed)
PTAR at bedside 

## 2019-01-23 DIAGNOSIS — U071 COVID-19: Secondary | ICD-10-CM | POA: Diagnosis not present

## 2019-01-23 DIAGNOSIS — R748 Abnormal levels of other serum enzymes: Secondary | ICD-10-CM | POA: Diagnosis not present

## 2019-01-23 DIAGNOSIS — J1289 Other viral pneumonia: Secondary | ICD-10-CM

## 2019-01-23 DIAGNOSIS — R0902 Hypoxemia: Secondary | ICD-10-CM | POA: Diagnosis not present

## 2019-01-23 LAB — CBC WITH DIFFERENTIAL/PLATELET
Abs Immature Granulocytes: 0.05 10*3/uL (ref 0.00–0.07)
Basophils Absolute: 0 10*3/uL (ref 0.0–0.1)
Basophils Relative: 0 %
Eosinophils Absolute: 0 10*3/uL (ref 0.0–0.5)
Eosinophils Relative: 0 %
HCT: 44.6 % (ref 39.0–52.0)
Hemoglobin: 14.3 g/dL (ref 13.0–17.0)
Immature Granulocytes: 1 %
Lymphocytes Relative: 11 %
Lymphs Abs: 0.9 10*3/uL (ref 0.7–4.0)
MCH: 27.1 pg (ref 26.0–34.0)
MCHC: 32.1 g/dL (ref 30.0–36.0)
MCV: 84.5 fL (ref 80.0–100.0)
Monocytes Absolute: 0.2 10*3/uL (ref 0.1–1.0)
Monocytes Relative: 3 %
Neutro Abs: 6.9 10*3/uL (ref 1.7–7.7)
Neutrophils Relative %: 85 %
Platelets: 149 10*3/uL — ABNORMAL LOW (ref 150–400)
RBC: 5.28 MIL/uL (ref 4.22–5.81)
RDW: 15.4 % (ref 11.5–15.5)
WBC: 8 10*3/uL (ref 4.0–10.5)
nRBC: 0 % (ref 0.0–0.2)

## 2019-01-23 LAB — ABO/RH: ABO/RH(D): O POS

## 2019-01-23 LAB — COMPREHENSIVE METABOLIC PANEL
ALT: 53 U/L — ABNORMAL HIGH (ref 0–44)
AST: 48 U/L — ABNORMAL HIGH (ref 15–41)
Albumin: 2.9 g/dL — ABNORMAL LOW (ref 3.5–5.0)
Alkaline Phosphatase: 92 U/L (ref 38–126)
Anion gap: 10 (ref 5–15)
BUN: 18 mg/dL (ref 8–23)
CO2: 24 mmol/L (ref 22–32)
Calcium: 8.6 mg/dL — ABNORMAL LOW (ref 8.9–10.3)
Chloride: 103 mmol/L (ref 98–111)
Creatinine, Ser: 0.84 mg/dL (ref 0.61–1.24)
GFR calc Af Amer: >60 mL/min (ref >60)
GFR calc non Af Amer: >60 mL/min (ref >60)
Glucose, Bld: 142 mg/dL — ABNORMAL HIGH (ref 70–99)
Potassium: 4.3 mmol/L (ref 3.5–5.1)
Sodium: 137 mmol/L (ref 135–145)
Total Bilirubin: 0.7 mg/dL (ref 0.3–1.2)
Total Protein: 6.7 g/dL (ref 6.5–8.1)

## 2019-01-23 LAB — GLUCOSE, CAPILLARY
Glucose-Capillary: 129 mg/dL — ABNORMAL HIGH (ref 70–99)
Glucose-Capillary: 133 mg/dL — ABNORMAL HIGH (ref 70–99)
Glucose-Capillary: 134 mg/dL — ABNORMAL HIGH (ref 70–99)
Glucose-Capillary: 248 mg/dL — ABNORMAL HIGH (ref 70–99)

## 2019-01-23 LAB — MAGNESIUM: Magnesium: 2 mg/dL (ref 1.7–2.4)

## 2019-01-23 LAB — D-DIMER, QUANTITATIVE: D-Dimer, Quant: 0.37 ug/mL-FEU (ref 0.00–0.50)

## 2019-01-23 LAB — FERRITIN: Ferritin: 721 ng/mL — ABNORMAL HIGH (ref 24–336)

## 2019-01-23 LAB — PHOSPHORUS: Phosphorus: 4.3 mg/dL (ref 2.5–4.6)

## 2019-01-23 LAB — HIV ANTIBODY (ROUTINE TESTING W REFLEX): HIV Screen 4th Generation wRfx: NONREACTIVE

## 2019-01-23 LAB — C-REACTIVE PROTEIN: CRP: 14.1 mg/dL — ABNORMAL HIGH (ref <1.0)

## 2019-01-23 MED ORDER — SODIUM CHLORIDE 0.9 % IV SOLN
200.0000 mg | Freq: Once | INTRAVENOUS | Status: AC
  Administered 2019-01-23: 200 mg via INTRAVENOUS
  Filled 2019-01-23: qty 40

## 2019-01-23 MED ORDER — SODIUM CHLORIDE 0.9 % IV SOLN
100.0000 mg | INTRAVENOUS | Status: AC
  Administered 2019-01-24 – 2019-01-27 (×4): 100 mg via INTRAVENOUS
  Filled 2019-01-23 (×4): qty 20

## 2019-01-23 NOTE — TOC Initial Note (Addendum)
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   Patient presents with 2-day history of fever, worsening shortness of breath, headache, generalized weakness and dizziness. COVID-19 test came back positive. Admitted for further treatment of COVID-19. On Remdesivir, oxygen at [facility]. Patient speaks Nepali.           Patient Goals and CMS Choice        Expected Discharge Plan and Services: to be determined.                                                Prior Living Arrangements/Services                       Activities of Daily Living Home Assistive Devices/Equipment: CBG Meter ADL Screening (condition at time of admission) Patient's cognitive ability adequate to safely complete daily activities?: Yes Is the patient deaf or have difficulty hearing?: No Does the patient have difficulty seeing, even when wearing glasses/contacts?: No Does the patient have difficulty concentrating, remembering, or making decisions?: No Patient able to express need for assistance with ADLs?: Yes Does the patient have difficulty dressing or bathing?: No Independently performs ADLs?: Yes (appropriate for developmental age)(patient very weak) Does the patient have difficulty walking or climbing stairs?: Yes(secondary to weakness) Weakness of Legs: Both Weakness of Arms/Hands: None  Permission Sought/Granted                  Emotional Assessment              Admission diagnosis:  Hypoxia [R09.02] COVID-19 virus infection [U07.1] Patient Active Problem  List   Diagnosis Date Noted  . Pneumonia due to severe acute respiratory syndrome coronavirus 2 (SARS-CoV-2) 01/22/2019  . Diabetic neuropathy ([place]) 01/22/2018  . Hyperlipidemia associated with type 2 diabetes mellitus ([place]) 07/29/2015  . Hearing loss 07/29/2015  . Vitamin D deficiency 05/15/2015  . Chronic low back pain 02/11/2015  . COPD, mild ([place]) 11/17/2014  . Chewing tobacco use 11/17/2014  . Chest pain 06/18/2014  . GERD (gastroesophageal reflux disease) 06/18/2014  . Nonspecific abnormal electrocardiogram (ECG) (EKG) 06/16/2014  . Diabetes type 2, controlled ([place]) 12/21/2012   PCP:  Charlott Rakes, MD Pharmacy:   [facility], [place] [address] [place] [address] Phone: [phone] Fax: [phone]     Social Determinants of Health (SDOH) Interventions    Readmission Risk Interventions No flowsheet data found.

## 2019-01-23 NOTE — Progress Notes (Signed)
Note for pt daughter in law in pt physical chart. Will pick it up tomorrow between 10am and 2pm

## 2019-01-23 NOTE — Progress Notes (Signed)
Family updated via pt phone

## 2019-01-23 NOTE — Progress Notes (Signed)
Remdesivir - Pharmacy Brief Note   O:  ALT: 53 CXR: Patchy airspace opacity superimposed on areas of fibrosis. Suspect atypical organism superimposed on chronic lung disease. SpO2: 97% on 4L [place]   A/P:  Remdesivir 200 mg once followed by 100 mg daily x 4 days.   Gretta Arab PharmD, BCPS Clinical pharmacist phone 7am- 5pm: [phone] 01/23/2019 9:48 AM

## 2019-01-23 NOTE — Progress Notes (Addendum)
PROGRESS NOTE    Essien Bahl [place]  [id number] DOB: 08/08/1950 DOA: 01/22/2019 PCP: Charlott Rakes, MD    Brief Narrative:  68 year old male who presented with dyspnea and fever.  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  Patient reports 2-day history of fever, dyspnea, headaches, generalized weakness and dizziness.  Initial physical examination his temperature was 99.7, heart rate 107, blood pressure 102/44, respiratory rate 25, oxygen saturation 86% on room air.  His lungs had decreased air entry, heart S1-S2 present rhythmic, abdomen soft, no lower extremity edema. Sodium 137, potassium 3.9, chloride 102, bicarb 26, glucose 93, BUN 17, creatinine 1.1, white count 10.0, hemoglobin 15.6, hematocrit 48.8, platelet 160.  COVID-19 was positive.  Chest radiograph with bilateral interstitial infiltrates, predominantly left lower lobe.  EKG 109 bpm, normal axis, normal intervals, sinus rhythm, poor R wave progression, no ST segment or T wave changes.  Patient was admitted to the hospital working diagnosis of SARS COVID-19 viral pneumonia complicated by acute hypoxic respiratory failure.  Assessment & Plan:   Active Problems:   Pneumonia due to severe acute respiratory syndrome coronavirus 2 (SARS-CoV-2)  1. Acute hypoxic respiratory failure due to COVID 19 viral pneumonia. Patient continue to have dyspnea, and cough.  RR: 15  Pulse oxymetry:94%  Fi02: 32 to 36% with 3 to 4 LPM per [place].  COVID-19 Labs  Recent Labs    01/22/19 1200 01/22/19 1220 01/23/19 0140  DDIMER  --  0.35 0.37  FERRITIN 674*  --  721*  LDH  --  186  --   CRP 9.1*  --  14.1*    Stable inflammatory markers, will continue to trend.  Continue medical therapy with remdesivir #2/5 and systemic corticosteroids with dexamethasone. Will continue guaifenesin DM, chlorpheniramine. And airway clearing techniques with flutter  valve and incentive spirometer. Out of bed as tolerated. Vitamin C and zinc.   2. COPD. No signs of acute exacerbation, will continue with bronchodilator therapy and oxymetry monitoring.   3. Elevated liver enzymes. AST at 48 and ALT at 53. Likely related to acute COVID 19 infection, will continue to trend enzymes.   4. Steroid induced hyperglycemia. Will continue glucose cover and monitoring with insulin sliding sale. Patient is tolerating po well.    DVT prophylaxis: enoxaparin   Code Status: full Family Communication: no family at the bedside  Disposition Plan/ discharge barriers: pending clinical improvement.   Body mass index is 28.32 kg/m. Malnutrition Type:      Malnutrition Characteristics:      Nutrition Interventions:     RN Pressure Injury Documentation:     Consultants:     Procedures:     Antimicrobials:       Subjective: Communication limited due to language barrier, but no dyspnea or chest pain, no nausea or vomiting. Poor appetite.   Objective: Vitals:   01/22/19 2018 01/22/19 2200 01/23/19 0425 01/23/19 0712  BP: 101/69 112/72 (!) 87/56 98/63  Pulse: 98 74 70 82  Resp: 20 20 20 18   Temp: 99.1 F (37.3 C) 98.9 F (37.2 C) 98.2 F (36.8 C) 97.7 F (36.5 C)  TempSrc: Oral   Oral  SpO2:  93% 93% 92%  Weight: 77.2 kg     Height: 5\' 5"  (1.651 m)       Intake/Output Summary (Last 24 hours) at 01/23/2019 0920 Last data filed at 01/23/2019 0200 Gross per 24 hour  Intake 1000 ml  Output 650 ml  Net 350 ml   Filed Weights   01/22/19 2018  Weight: 77.2 kg    Examination:   General: Not in pain or dyspnea, deconditioned  Neurology: Awake and alert, non focal  E ENT: mild pallor, no icterus, oral mucosa moist Cardiovascular: No JVD. S1-S2 present, rhythmic, no gallops, rubs, or murmurs. No lower extremity edema. Pulmonary: poisitve breath sounds bilaterally. Gastrointestinal. Abdomen with no organomegaly, non tender, no rebound or  guarding Skin. No rashes Musculoskeletal: no joint deformities     Data Reviewed: I have personally reviewed following labs and imaging studies  CBC: Recent Labs  Lab 01/22/19 1200 01/23/19 0140  WBC 10.0 8.0  NEUTROABS 8.3* 6.9  HGB 15.6 14.3  HCT 48.8 44.6  MCV 86.2 84.5  PLT 160 [id number]*   Basic Metabolic Panel: Recent Labs  Lab 01/22/19 1200 01/23/19 0140  NA 137 137  K 3.9 4.3  CL 102 103  CO2 26 24  GLUCOSE 93 142*  BUN 17 18  CREATININE 1.11 0.84  CALCIUM 8.6* 8.6*  MG  --  2.0  PHOS  --  4.3   GFR: Estimated Creatinine Clearance: 79.6 mL/min (by C-G formula based on SCr of 0.84 mg/dL). Liver Function Tests: Recent Labs  Lab 01/22/19 1200 01/23/19 0140  AST 37 48*  ALT 48* 53*  ALKPHOS 104 92  BILITOT 1.0 0.7  PROT 7.3 6.7  ALBUMIN 3.5 2.9*   No results for input(s): LIPASE, AMYLASE in the last 168 hours. No results for input(s): AMMONIA in the last 168 hours. Coagulation Profile: No results for input(s): INR, PROTIME in the last 168 hours. Cardiac Enzymes: No results for input(s): CKTOTAL, CKMB, CKMBINDEX, TROPONINI in the last 168 hours. BNP (last 3 results) No results for input(s): PROBNP in the last 8760 hours. HbA1C: No results for input(s): HGBA1C in the last 72 hours. CBG: Recent Labs  Lab 01/22/19 2021 01/23/19 0709  GLUCAP 124* 129*   Lipid Profile: Recent Labs    01/22/19 1200  TRIG 161*   Thyroid Function Tests: No results for input(s): TSH, T4TOTAL, FREET4, T3FREE, THYROIDAB in the last 72 hours. Anemia Panel: Recent Labs    01/22/19 1200 01/23/19 0140  FERRITIN 674* 721*      Radiology Studies: I have reviewed all of the imaging during this hospital visit personally     Scheduled Meds: . atorvastatin  80 mg Oral Daily  . dexamethasone (DECADRON) injection  6 mg Intravenous Q24H  . enoxaparin (LOVENOX) injection  40 mg Subcutaneous Q24H  . fluticasone furoate-vilanterol  1 puff Inhalation Daily  . insulin  aspart  0-9 Units Subcutaneous TID WC  . tiotropium  18 mcg Inhalation Daily   Continuous Infusions:   LOS: 1 day        Jady Braggs Gerome Apley, MD

## 2019-01-24 DIAGNOSIS — K219 Gastro-esophageal reflux disease without esophagitis: Secondary | ICD-10-CM | POA: Diagnosis not present

## 2019-01-24 DIAGNOSIS — E119 Type 2 diabetes mellitus without complications: Secondary | ICD-10-CM | POA: Diagnosis not present

## 2019-01-24 DIAGNOSIS — J449 Chronic obstructive pulmonary disease, unspecified: Secondary | ICD-10-CM

## 2019-01-24 DIAGNOSIS — U071 COVID-19: Secondary | ICD-10-CM | POA: Diagnosis not present

## 2019-01-24 LAB — COMPREHENSIVE METABOLIC PANEL
ALT: 50 U/L — ABNORMAL HIGH (ref 0–44)
AST: 44 U/L — ABNORMAL HIGH (ref 15–41)
Albumin: 2.9 g/dL — ABNORMAL LOW (ref 3.5–5.0)
Alkaline Phosphatase: 82 U/L (ref 38–126)
Anion gap: 11 (ref 5–15)
BUN: 28 mg/dL — ABNORMAL HIGH (ref 8–23)
CO2: 23 mmol/L (ref 22–32)
Calcium: 8.8 mg/dL — ABNORMAL LOW (ref 8.9–10.3)
Chloride: 105 mmol/L (ref 98–111)
Creatinine, Ser: 0.77 mg/dL (ref 0.61–1.24)
GFR calc Af Amer: >60 mL/min (ref >60)
GFR calc non Af Amer: >60 mL/min (ref >60)
Glucose, Bld: 132 mg/dL — ABNORMAL HIGH (ref 70–99)
Potassium: 4.3 mmol/L (ref 3.5–5.1)
Sodium: 139 mmol/L (ref 135–145)
Total Bilirubin: 0.6 mg/dL (ref 0.3–1.2)
Total Protein: 6.4 g/dL — ABNORMAL LOW (ref 6.5–8.1)

## 2019-01-24 LAB — GLUCOSE, CAPILLARY
Glucose-Capillary: 116 mg/dL — ABNORMAL HIGH (ref 70–99)
Glucose-Capillary: 147 mg/dL — ABNORMAL HIGH (ref 70–99)
Glucose-Capillary: 134 mg/dL — ABNORMAL HIGH (ref 70–99)
Glucose-Capillary: 148 mg/dL — ABNORMAL HIGH (ref 70–99)

## 2019-01-24 LAB — D-DIMER, QUANTITATIVE: D-Dimer, Quant: 0.29 ug/mL-FEU (ref 0.00–0.50)

## 2019-01-24 LAB — FERRITIN: Ferritin: 761 ng/mL — ABNORMAL HIGH (ref 24–336)

## 2019-01-24 LAB — C-REACTIVE PROTEIN: CRP: 8.8 mg/dL — ABNORMAL HIGH (ref <1.0)

## 2019-01-24 MED ORDER — HYPROMELLOSE (GONIOSCOPIC) 2.5 % OP SOLN
1.0000 [drp] | Freq: Four times a day (QID) | OPHTHALMIC | Status: DC | PRN
  Filled 2019-01-24: qty 15

## 2019-01-24 MED ORDER — POLYVINYL ALCOHOL 1.4 % OP SOLN
1.0000 [drp] | Freq: Four times a day (QID) | OPHTHALMIC | Status: DC | PRN
  Administered 2019-01-24: 1 [drp] via OPHTHALMIC
  Filled 2019-01-24: qty 15

## 2019-01-24 MED ORDER — PANTOPRAZOLE SODIUM 40 MG PO TBEC
40.0000 mg | DELAYED_RELEASE_TABLET | Freq: Every day | ORAL | Status: DC
  Administered 2019-01-24 – 2019-01-27 (×4): 40 mg via ORAL
  Filled 2019-01-24 (×4): qty 1

## 2019-01-24 NOTE — Progress Notes (Signed)
PROGRESS NOTE    David Barber  [id number] DOB: 05/18/1950 DOA: 01/22/2019 PCP: Charlott Rakes, MD    Brief Narrative:  68 year old male who presented with dyspnea and fever.  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  Patient reports 2-day history of fever, dyspnea, headaches, generalized weakness and dizziness.  Initial physical examination his temperature was 99.7, heart rate 107, blood pressure 102/44, respiratory rate 25, oxygen saturation 86% on room air.  His lungs had decreased air entry, heart S1-S2 present rhythmic, abdomen soft, no lower extremity edema. Sodium 137, potassium 3.9, chloride 102, bicarb 26, glucose 93, BUN 17, creatinine 1.1, white count 10.0, hemoglobin 15.6, hematocrit 48.8, platelet 160.  COVID-19 was positive.  Chest radiograph with bilateral interstitial infiltrates, predominantly left lower lobe.  EKG 109 bpm, normal axis, normal intervals, sinus rhythm, poor R wave progression, no ST segment or T wave changes.  Patient was admitted to the hospital working diagnosis of SARS COVID-19 viral pneumonia complicated by acute hypoxic respiratory failure.    Assessment & Plan:   Principal Problem:   Pneumonia due to severe acute respiratory syndrome coronavirus 2 (SARS-CoV-2) Active Problems:   Diabetes type 2, controlled (HCC)   GERD (gastroesophageal reflux disease)   COPD, mild (HCC)   Diabetic neuropathy ([place])   1. Acute hypoxic respiratory failure due to COVID 19 viral pneumonia. His symptoms have been improving, but not yet back to baseline.  RR: 18 to 20  Pulse oxymetry:90 to 92% Fi02: 36% Fi02, 4 LPM per [place].   COVID-19 Labs  Recent Labs    01/22/19 1200 01/22/19 1220 01/23/19 0140 01/24/19 0000  DDIMER  --  0.35 0.37 0.29  FERRITIN 674*  --  721* 761*  LDH  --  186  --   --   CRP 9.1*  --  14.1* 8.8*   Inflammatory markers are improving,  trending down.   Tolerating well medical therapy with Remdesivir #3/5 (AST 44 and ALT 50)  and systemic corticosteroids with dexamethasone  6 mg per day. On guaifenesin DM, guaifenesin with chlorpheniramine. Airway clearing techniques with flutter valve and incentive spirometer. Vitamin C and zinc.   2. COPD. No signs of clinical signs of acute exacerbation. Continue with bronchodilator therapy and oxymetry monitoring.   3. Elevated liver enzymes. Stable AST at 44 and ALT at 50. Will continue close monitoring.  4. T2DM with steroid induced hyperglycemia. Fasting glucose 132, will continue glucose cover and monitoring with insulin sliding scale, patient is tolerating po well.   5. GERD. Continue antiacid therapy with proton pump inhibitors.   DVT prophylaxis: enoxaparin   Code Status:  full Family Communication: no family at the bedside  Disposition Plan/ discharge barriers: pending clinical improvement, completion of Remdesivir course.   Body mass index is 28.32 kg/m. Malnutrition Type:      Malnutrition Characteristics:      Nutrition Interventions:     RN Pressure Injury Documentation:     Consultants:     Procedures:     Antimicrobials:       Subjective: Patient is feeling better, no dyspnea, no chest pain, no nausea or vomiting, poor appetie.   Objective: Vitals:   01/23/19 1519 01/23/19 1932 01/24/19 0417 01/24/19 0747  BP: 103/70 100/60 98/64 97/63   Pulse: 86 87 81 85  Resp: 15 16 20 18   Temp: (!) 97.4 F (36.3 C) 97.6 F (36.4 C) 98.1 F (36.7 C) 98  F (36.7 C)  TempSrc: Oral Oral  Oral  SpO2: 94% 91% 92% 90%  Weight:      Height:        Intake/Output Summary (Last 24 hours) at 01/24/2019 0856 Last data filed at 01/24/2019 0200 Gross per 24 hour  Intake 875 ml  Output 1400 ml  Net -525 ml   Filed Weights   01/22/19 2018  Weight: 77.2 kg    Examination:   General: Not in pain or dyspnea, deconditioned  Neurology: Awake and  alert, non focal  E ENT: mild pallor, no icterus, oral mucosa moist Cardiovascular: No JVD. S1-S2 present, rhythmic, no gallops, rubs, or murmurs. No lower extremity edema. Pulmonary: posiitve breath sounds bilaterally. Gastrointestinal. Abdomen with  no organomegaly, non tender, no rebound or guarding Skin. No rashes Musculoskeletal: no joint deformities     Data Reviewed: I have personally reviewed following labs and imaging studies  CBC: Recent Labs  Lab 01/22/19 1200 01/23/19 0140  WBC 10.0 8.0  NEUTROABS 8.3* 6.9  HGB 15.6 14.3  HCT 48.8 44.6  MCV 86.2 84.5  PLT 160 123456*   Basic Metabolic Panel: Recent Labs  Lab 01/22/19 1200 01/23/19 0140 01/24/19 0000  NA 137 137 139  K 3.9 4.3 4.3  CL 102 103 105  CO2 26 24 23   GLUCOSE 93 142* 132*  BUN 17 18 28*  CREATININE 1.11 0.84 0.77  CALCIUM 8.6* 8.6* 8.8*  MG  --  2.0  --   PHOS  --  4.3  --    GFR: Estimated Creatinine Clearance: 83.6 mL/min (by C-G formula based on SCr of 0.77 mg/dL). Liver Function Tests: Recent Labs  Lab 01/22/19 1200 01/23/19 0140 01/24/19 0000  AST 37 48* 44*  ALT 48* 53* 50*  ALKPHOS 104 92 82  BILITOT 1.0 0.7 0.6  PROT 7.3 6.7 6.4*  ALBUMIN 3.5 2.9* 2.9*   No results for input(s): LIPASE, AMYLASE in the last 168 hours. No results for input(s): AMMONIA in the last 168 hours. Coagulation Profile: No results for input(s): INR, PROTIME in the last 168 hours. Cardiac Enzymes: No results for input(s): CKTOTAL, CKMB, CKMBINDEX, TROPONINI in the last 168 hours. BNP (last 3 results) No results for input(s): PROBNP in the last 8760 hours. HbA1C: No results for input(s): HGBA1C in the last 72 hours. CBG: Recent Labs  Lab 01/23/19 0709 01/23/19 1204 01/23/19 1557 01/23/19 2025 01/24/19 0745  GLUCAP 129* 133* 134* 248* 116*   Lipid Profile: Recent Labs    01/22/19 1200  TRIG 161*   Thyroid Function Tests: No results for input(s): TSH, T4TOTAL, FREET4, T3FREE, THYROIDAB in  the last 72 hours. Anemia Panel: Recent Labs    01/23/19 0140 01/24/19 0000  FERRITIN 721* 761*      Radiology Studies: I have reviewed all of the imaging during this hospital visit personally     Scheduled Meds: . atorvastatin  80 mg Oral Daily  . dexamethasone (DECADRON) injection  6 mg Intravenous Q24H  . enoxaparin (LOVENOX) injection  40 mg Subcutaneous Q24H  . fluticasone furoate-vilanterol  1 puff Inhalation Daily  . insulin aspart  0-9 Units Subcutaneous TID WC  . tiotropium  18 mcg Inhalation Daily   Continuous Infusions: . remdesivir 100 mg in NS 250 mL       LOS: 2 days        David Zuba Gerome Apley, MD

## 2019-01-25 DIAGNOSIS — E119 Type 2 diabetes mellitus without complications: Secondary | ICD-10-CM | POA: Diagnosis not present

## 2019-01-25 DIAGNOSIS — K219 Gastro-esophageal reflux disease without esophagitis: Secondary | ICD-10-CM | POA: Diagnosis not present

## 2019-01-25 DIAGNOSIS — U071 COVID-19: Secondary | ICD-10-CM | POA: Diagnosis not present

## 2019-01-25 DIAGNOSIS — J449 Chronic obstructive pulmonary disease, unspecified: Secondary | ICD-10-CM | POA: Diagnosis not present

## 2019-01-25 LAB — COMPREHENSIVE METABOLIC PANEL
ALT: 56 U/L — ABNORMAL HIGH (ref 0–44)
AST: 42 U/L — ABNORMAL HIGH (ref 15–41)
Albumin: 2.9 g/dL — ABNORMAL LOW (ref 3.5–5.0)
Alkaline Phosphatase: 78 U/L (ref 38–126)
Anion gap: 10 (ref 5–15)
BUN: 27 mg/dL — ABNORMAL HIGH (ref 8–23)
CO2: 25 mmol/L (ref 22–32)
Calcium: 9 mg/dL (ref 8.9–10.3)
Chloride: 107 mmol/L (ref 98–111)
Creatinine, Ser: 0.77 mg/dL (ref 0.61–1.24)
GFR calc Af Amer: >60 mL/min (ref >60)
GFR calc non Af Amer: >60 mL/min (ref >60)
Glucose, Bld: 115 mg/dL — ABNORMAL HIGH (ref 70–99)
Potassium: 4.6 mmol/L (ref 3.5–5.1)
Sodium: 142 mmol/L (ref 135–145)
Total Bilirubin: 0.9 mg/dL (ref 0.3–1.2)
Total Protein: 6.4 g/dL — ABNORMAL LOW (ref 6.5–8.1)

## 2019-01-25 LAB — GLUCOSE, CAPILLARY
Glucose-Capillary: 100 mg/dL — ABNORMAL HIGH (ref 70–99)
Glucose-Capillary: 137 mg/dL — ABNORMAL HIGH (ref 70–99)
Glucose-Capillary: 154 mg/dL — ABNORMAL HIGH (ref 70–99)
Glucose-Capillary: 118 mg/dL — ABNORMAL HIGH (ref 70–99)

## 2019-01-25 LAB — D-DIMER, QUANTITATIVE: D-Dimer, Quant: 0.31 ug/mL-FEU (ref 0.00–0.50)

## 2019-01-25 LAB — FERRITIN: Ferritin: 734 ng/mL — ABNORMAL HIGH (ref 24–336)

## 2019-01-25 LAB — C-REACTIVE PROTEIN: CRP: 3.1 mg/dL — ABNORMAL HIGH (ref <1.0)

## 2019-01-25 NOTE — Progress Notes (Signed)
PROGRESS NOTE    David Barber  [id number] DOB: 1950-10-15 DOA: 01/22/2019 PCP: Charlott Rakes, MD    Brief Narrative:  68 year old male who presented with dyspnea and fever. 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 Patient reports 2-day history of fever, dyspnea, headaches, generalized weakness and dizziness. Initial physical examination his temperature was 99.7, heart rate 107, blood pressure 102/44, respiratory rate 25, oxygen saturation 86% on room air. His lungs had decreased air entry, heart S1-S2 present rhythmic, abdomen soft, no lower extremity edema. Sodium 137, potassium 3.9, chloride 102, bicarb 26, glucose 93, BUN 17, creatinine 1.1, white count 10.0, hemoglobin 15.6, hematocrit 48.8,platelet 160. COVID-19 was positive. Chest radiograph with bilateral interstitial infiltrates, predominantly left lower lobe. EKG 109 bpm, normal axis, normal intervals, sinus rhythm, poor R wave progression, no ST segment or T wave changes.  Patient was admitted to the hospital working diagnosis of SARS COVID-19 viral pneumonia complicated by acute hypoxic respiratory failure.  Patient has responding well to medical therapy with remdesivir and steroids, but continue to have supplemental oxygen requirements up to 4 LPM.   Assessment & Plan:   Principal Problem:   Pneumonia due to severe acute respiratory syndrome coronavirus 2 (SARS-CoV-2) Active Problems:   Diabetes type 2, controlled (HCC)   GERD (gastroesophageal reflux disease)   COPD, mild (HCC)   Diabetic neuropathy ([place])    1. Acute hypoxic respiratory failure due to COVID 19 viral pneumonia. His symptoms continue improving, but not yet back to baseline, but has persistent need of supplemental 02 per [place].   RR: 18  Pulse oxymetry: 97  Fi02: 36% Fi02 with 4 LPM per [place].  COVID-19 Labs  Recent Labs    01/22/19 1220  01/23/19 0140 01/24/19 0000 01/25/19 0538  DDIMER 0.35 0.37 0.29 0.31  FERRITIN  --  721* 761* 734*  LDH 186  --   --   --   CRP  --  14.1* 8.8* 3.1*    Inflammatory markers continue improving, and trending down.   On Remdesivir #4/5 (AST 42 and ALT 56) , systemic corticosteroids with dexamethasone  6 mg per day, guaifenesin DM, and chlorpheniramine. Continue with airway clearing techniques with flutter valve and incentive spirometer. Vitamin C and zinc.   Check ambulatory oxymetry on room air, patient may need supplemental 02 at discharge.   2. COPD No acute exacerbation. On bronchodilator therapy. Continue with Breo and spiriva.   3. Elevated liver enzymes. AST at 42 from 44 and ALT at 56 from 50, likely due to COVID 19 viral infection. Will continue to trend liver enzymes.   4. T2DM with steroid induced hyperglycemia. Fasting glucose 115 mg/dl. Glucose cover and monitoring with insulin sliding scale.   5. GERD. On pantoprazole.  6. Dyslipidemia. Continue with atorvastatin and continue to follow on liver function testing.   DVT prophylaxis: enoxaparin   Code Status:  full Family Communication: no family at the bedside  Disposition Plan/ discharge barriers: pending clinical improvement, completion of Remdesivir course.     Body mass index is 28.32 kg/m. Malnutrition Type:      Malnutrition Characteristics:      Nutrition Interventions:     RN Pressure Injury Documentation:     Consultants:     Procedures:     Antimicrobials:       Subjective: Patient is feeling well, moderate appetite, no nausea or vomiting, denies any chest pain or significant dyspnea,  continue to need supplemental 02 per [place].   Objective: Vitals:   01/24/19 2142 01/25/19 0200 01/25/19 0801 01/25/19 0830  BP:   122/90   Pulse:   72   Resp:  20 18   Temp:   98.1 F (36.7 C)   TempSrc:   Oral   SpO2: 94%  96% 97%  Weight:      Height:        Intake/Output Summary  (Last 24 hours) at 01/25/2019 0900 Last data filed at 01/25/2019 0215 Gross per 24 hour  Intake 730 ml  Output 350 ml  Net 380 ml   Filed Weights   01/22/19 2018  Weight: 77.2 kg    Examination:   General: Not in pain or dyspnea, deconditioned  Neurology: Awake and alert, non focal  E ENT: mild pallor, no icterus, oral mucosa moist Cardiovascular: No JVD. S1-S2 present, rhythmic, no gallops, rubs, or murmurs. No lower extremity edema. Pulmonary: vesicular breath sounds bilaterally.  Gastrointestinal. Abdomen with no organomegaly, non tender, no rebound or guarding Skin. No rashes Musculoskeletal: no joint deformities     Data Reviewed: I have personally reviewed following labs and imaging studies  CBC: Recent Labs  Lab 01/22/19 1200 01/23/19 0140  WBC 10.0 8.0  NEUTROABS 8.3* 6.9  HGB 15.6 14.3  HCT 48.8 44.6  MCV 86.2 84.5  PLT 160 123456*   Basic Metabolic Panel: Recent Labs  Lab 01/22/19 1200 01/23/19 0140 01/24/19 0000 01/25/19 0538  NA 137 137 139 142  K 3.9 4.3 4.3 4.6  CL 102 103 105 107  CO2 26 24 23 25   GLUCOSE 93 142* 132* 115*  BUN 17 18 28* 27*  CREATININE 1.11 0.84 0.77 0.77  CALCIUM 8.6* 8.6* 8.8* 9.0  MG  --  2.0  --   --   PHOS  --  4.3  --   --    GFR: Estimated Creatinine Clearance: 83.6 mL/min (by C-G formula based on SCr of 0.77 mg/dL). Liver Function Tests: Recent Labs  Lab 01/22/19 1200 01/23/19 0140 01/24/19 0000 01/25/19 0538  AST 37 48* 44* 42*  ALT 48* 53* 50* 56*  ALKPHOS 104 92 82 78  BILITOT 1.0 0.7 0.6 0.9  PROT 7.3 6.7 6.4* 6.4*  ALBUMIN 3.5 2.9* 2.9* 2.9*   No results for input(s): LIPASE, AMYLASE in the last 168 hours. No results for input(s): AMMONIA in the last 168 hours. Coagulation Profile: No results for input(s): INR, PROTIME in the last 168 hours. Cardiac Enzymes: No results for input(s): CKTOTAL, CKMB, CKMBINDEX, TROPONINI in the last 168 hours. BNP (last 3 results) No results for input(s): PROBNP in  the last 8760 hours. HbA1C: No results for input(s): HGBA1C in the last 72 hours. CBG: Recent Labs  Lab 01/24/19 0745 01/24/19 1145 01/24/19 1607 01/24/19 2147 01/25/19 0759  GLUCAP 116* 147* 134* 148* 100*   Lipid Profile: Recent Labs    01/22/19 1200  TRIG 161*   Thyroid Function Tests: No results for input(s): TSH, T4TOTAL, FREET4, T3FREE, THYROIDAB in the last 72 hours. Anemia Panel: Recent Labs    01/24/19 0000 01/25/19 0538  FERRITIN 761* 734*      Radiology Studies: I have reviewed all of the imaging during this hospital visit personally     Scheduled Meds: . atorvastatin  80 mg Oral Daily  . dexamethasone (DECADRON) injection  6 mg Intravenous Q24H  . enoxaparin (LOVENOX) injection  40 mg Subcutaneous Q24H  . fluticasone furoate-vilanterol  1 puff Inhalation  Daily  . insulin aspart  0-9 Units Subcutaneous TID WC  . pantoprazole  40 mg Oral Daily  . tiotropium  18 mcg Inhalation Daily   Continuous Infusions: . remdesivir 100 mg in NS 250 mL 100 mg (01/25/19 0814)     LOS: 3 days        Jude Linck Gerome Apley, MD

## 2019-01-25 NOTE — Progress Notes (Signed)
SATURATION QUALIFICATIONS: Patient Saturations on Room Air at Rest = 87%  Patient Saturations on Room Air while Ambulating = 80%  Patient Saturations on 2 Liters of oxygen while Ambulating = 89%  Patient still required oxygen to maintain adequate oxygen saturation at rest and while ambulating.

## 2019-01-25 NOTE — Evaluation (Signed)
Physical Therapy Evaluation Patient Details Name: David Barber MRN: [id number] DOB: March 01, 1951 Today's Date: 01/25/2019   History of Present Illness  68 y/o male w/ hx HTN, GERD, DM, COPD, Bronchitis, admitted to the hospital working diagnosis of SARS COVID-19 viral pneumonia complicated by acute hypoxic respiratory failure. Pt is Nepali speaking only  Clinical Impression   Pt admitted with above diagnosis. PTA states was very independent, but lately he has had some decline in function prior to being hospitalized. He lives home with many a family member, most of which have also tested positive for COVID.  Pt currently with functional limitations due to the deficits listed below (see PT Problem List). Pt is at SBA to min guard assist with functional mobility, he was able to ambulate approx 249ft with no AD and SBA on 2L/min and desat min 84%, also able to recover very quickly once resting. He walked in room approx 30 ft on room air and used rest room desat to 85%, again once resting able to recover very quickly. Pt has reported that at time she uses cane at home secondary to dizziness he feels, but did not show signs of vestibular deficits today. Pt will benefit from skilled PT to increase their independence and safety with mobility to allow discharge to the venue listed below.        Follow Up Recommendations No PT follow up    Equipment Recommendations  3in1 (PT);Rolling walker with 5" wheels    Recommendations for Other Services       Precautions / Restrictions Precautions Precautions: Fall Precaution Comments: speaks Nepali only Restrictions Weight Bearing Restrictions: No      Mobility  Bed Mobility Overal bed mobility: Modified Independent                Transfers Overall transfer level: Needs assistance   Transfers: Sit to/from Stand Sit to Stand: Supervision         General transfer comment: needs set up and line management  help  Ambulation/Gait Ambulation/Gait assistance: Supervision Gait Distance (Feet): 250 Feet Assistive device: None Gait Pattern/deviations: Step-through pattern     General Gait Details: ambulated approx 244ft with no AD and SBA, when asked if this distance was ok states this is nothing he used to walk up to 25 mins or so daily. he can now maybe walk up to 10 mins at a time. 0n 2L/min via  lowest 02 reading was 84%  Financial trader Rankin (Stroke Patients Only)       Balance Overall balance assessment: Needs assistance Sitting-balance support: Feet unsupported Sitting balance-Leahy Scale: Good   Postural control: Posterior lean(slight) Standing balance support: During functional activity Standing balance-Leahy Scale: Fair                               Pertinent Vitals/Pain Pain Assessment: No/denies pain    Home Living Family/patient expects to be discharged to:: Private residence Living Arrangements: Spouse/significant other;Children Available Help at Discharge: Family Type of Home: House Home Access: Level entry     Home Layout: One level Home Equipment: Kasandra Knudsen - single point Additional Comments: states lives home with a lot of family members and most of whom tested positive for virus, also states his son is disabled    Prior Function Level of Independence: Independent with assistive device(s)  Comments: was very independent until recently when became more and more debilitated and using cane, also report sof having dizzy spells at times (denies them this day)     Hand Dominance        Extremity/Trunk Assessment   Upper Extremity Assessment Upper Extremity Assessment: Overall WFL for tasks assessed    Lower Extremity Assessment Lower Extremity Assessment: Overall WFL for tasks assessed    Cervical / Trunk Assessment Cervical / Trunk Assessment: Normal  Communication   Communication:  Interpreter utilized  Cognition Arousal/Alertness: Awake/alert Behavior During Therapy: WFL for tasks assessed/performed Overall Cognitive Status: Within Functional Limits for tasks assessed                                 General Comments: seemed within functional, used interpreter       General Comments      Exercises     Assessment/Plan    PT Assessment Patient needs continued PT services  PT Problem List Decreased strength;Decreased activity tolerance;Decreased mobility;Decreased safety awareness       PT Treatment Interventions Gait training;Functional mobility training;Therapeutic activities;Therapeutic exercise;Balance training;Neuromuscular re-education;Patient/family education    PT Goals (Current goals can be found in the Care Plan section)  Acute Rehab PT Goals Patient Stated Goal: states wants to go home, all family also tested positive including 81 year old grand daughter Time For Goal Achievement: 02/08/19 Potential to Achieve Goals: Good    Frequency Min 3X/week   Barriers to discharge Other (comment) other family members also tested positive    Co-evaluation               AM-PAC PT "6 Clicks" Mobility  Outcome Measure Help needed turning from your back to your side while in a flat bed without using bedrails?: None Help needed moving from lying on your back to sitting on the side of a flat bed without using bedrails?: None Help needed moving to and from a bed to a chair (including a wheelchair)?: A Little Help needed standing up from a chair using your arms (e.g., wheelchair or bedside chair)?: A Little Help needed to walk in hospital room?: A Little Help needed climbing 3-5 steps with a railing? : A Lot 6 Click Score: 19    End of Session Equipment Utilized During Treatment: Oxygen Activity Tolerance: Patient tolerated treatment well Patient left: in bed;with call bell/phone within reach   PT Visit Diagnosis: Other abnormalities  of gait and mobility (R26.89);Muscle weakness (generalized) (M62.81)    Time: [id number] PT Time Calculation (min) (ACUTE ONLY): 28 min   Charges:   PT Evaluation $PT Eval Moderate Complexity: 1 Mod PT Treatments $Gait Training: 8-22 mins        Horald Chestnut, PT   Delford Field 01/25/2019, 4:25 PM

## 2019-01-26 DIAGNOSIS — E1149 Type 2 diabetes mellitus with other diabetic neurological complication: Secondary | ICD-10-CM | POA: Diagnosis not present

## 2019-01-26 DIAGNOSIS — E119 Type 2 diabetes mellitus without complications: Secondary | ICD-10-CM | POA: Diagnosis not present

## 2019-01-26 DIAGNOSIS — U071 COVID-19: Secondary | ICD-10-CM | POA: Diagnosis not present

## 2019-01-26 DIAGNOSIS — J449 Chronic obstructive pulmonary disease, unspecified: Secondary | ICD-10-CM | POA: Diagnosis not present

## 2019-01-26 LAB — COMPREHENSIVE METABOLIC PANEL
ALT: 73 U/L — ABNORMAL HIGH (ref 0–44)
AST: 52 U/L — ABNORMAL HIGH (ref 15–41)
Albumin: 2.7 g/dL — ABNORMAL LOW (ref 3.5–5.0)
Alkaline Phosphatase: 78 U/L (ref 38–126)
Anion gap: 12 (ref 5–15)
BUN: 27 mg/dL — ABNORMAL HIGH (ref 8–23)
CO2: 24 mmol/L (ref 22–32)
Calcium: 8.8 mg/dL — ABNORMAL LOW (ref 8.9–10.3)
Chloride: 103 mmol/L (ref 98–111)
Creatinine, Ser: 0.74 mg/dL (ref 0.61–1.24)
GFR calc Af Amer: >60 mL/min (ref >60)
GFR calc non Af Amer: >60 mL/min (ref >60)
Glucose, Bld: 104 mg/dL — ABNORMAL HIGH (ref 70–99)
Potassium: 4.4 mmol/L (ref 3.5–5.1)
Sodium: 139 mmol/L (ref 135–145)
Total Bilirubin: 0.6 mg/dL (ref 0.3–1.2)
Total Protein: 6.4 g/dL — ABNORMAL LOW (ref 6.5–8.1)

## 2019-01-26 LAB — GLUCOSE, CAPILLARY
Glucose-Capillary: 92 mg/dL (ref 70–99)
Glucose-Capillary: 111 mg/dL — ABNORMAL HIGH (ref 70–99)
Glucose-Capillary: 193 mg/dL — ABNORMAL HIGH (ref 70–99)
Glucose-Capillary: 118 mg/dL — ABNORMAL HIGH (ref 70–99)

## 2019-01-26 LAB — D-DIMER, QUANTITATIVE: D-Dimer, Quant: 0.36 ug/mL-FEU (ref 0.00–0.50)

## 2019-01-26 LAB — FERRITIN: Ferritin: 726 ng/mL — ABNORMAL HIGH (ref 24–336)

## 2019-01-26 LAB — C-REACTIVE PROTEIN: CRP: 1.4 mg/dL — ABNORMAL HIGH (ref <1.0)

## 2019-01-26 MED ORDER — LOPERAMIDE HCL 2 MG PO CAPS: 2.0000 mg | ORAL_CAPSULE | ORAL | Status: DC | PRN

## 2019-01-26 NOTE — Progress Notes (Signed)
PROGRESS NOTE    David Barber [place]  [id number] DOB: May 24, 1950 DOA: 01/22/2019 PCP: Charlott Rakes, MD    Brief Narrative:  68 year old male who presented with dyspnea and fever. 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 Patient reports 2-day history of fever, dyspnea, headaches, generalized weakness and dizziness. Initial physical examination his temperature was 99.7, heart rate 107, blood pressure 102/44, respiratory rate 25, oxygen saturation 86% on room air. His lungs had decreased air entry, heart S1-S2 present rhythmic, abdomen soft, no lower extremity edema. Sodium 137, potassium 3.9, chloride 102, bicarb 26, glucose 93, BUN 17, creatinine 1.1, white count 10.0, hemoglobin 15.6, hematocrit 48.8,platelet 160. COVID-19 was positive. Chest radiograph with bilateral interstitial infiltrates, predominantly left lower lobe. EKG 109 bpm, normal axis, normal intervals, sinus rhythm, poor R wave progression, no ST segment or T wave changes.  Patient was admitted to the hospital working diagnosis of SARS COVID-19 viral pneumonia complicated by acute hypoxic respiratory failure.  Patient has responding well to medical therapy with remdesivir and steroids, but continue to have supplemental oxygen requirements up to 4 LPM.   On room air his oxygenation at rest is 87%, ambulating 80% requiring 2 L per nasal cannula to reach 89%.    Assessment & Plan:   Principal Problem:   Pneumonia due to severe acute respiratory syndrome coronavirus 2 (SARS-CoV-2) Active Problems:   Diabetes type 2, controlled (HCC)   GERD (gastroesophageal reflux disease)   COPD, mild (HCC)   Diabetic neuropathy ([place])   1. Acute hypoxic respiratory failure due to COVID 19 viral pneumonia.Patient with poor oral intake and loose stools, dyspnea has been improving, positive desaturation on ambulation.   RR: 16 to 17  Pulse oxymetry: 94%  Fi02: 36% Fi02, 4 LPM per [place].   COVID-19 Labs  Recent Labs    01/24/19 0000 01/25/19 0538 01/26/19 0213  DDIMER 0.29 0.31 0.36  FERRITIN 761* 734* 726*  CRP 8.8* 3.1* 1.4*    Inflammatory markers are trending down.   On Remdesivir #4/5(AST 52 and ALT 73), continue with systemic corticosteroids with dexamethasone. Antitussive with guaifenesin DM, andchlorpheniramine. A airway clearing techniques with flutter valve and incentive spirometer.Vitamin C and zinc.   Likely will need home 02, will have repeat ambulatory oxymetry on room air  in am, before discharge.  Will add lomotil for loose stools.   2. COPD No signs of acute exacerbation. Continue withbronchodilator therapy, with Breo and spiriva.   3. Elevated liver enzymes.Persistent elevation of liver enzymes at AST at 52 from ALT 73. Follow liver panel in am.   4.T2DM with steroid induced hyperglycemia.Fasting glucose 104 mg/dl. On insulin sliding scale for glucose cover and monitoring.   5. GERD. Continue with pantoprazole.  6. Dyslipidemia. On atorvastatin.  DVT prophylaxis:enoxaparin Code Status:full Family Communication:no family at the bedside Disposition Plan/ discharge barriers:possible dc home in am, may need home 02.   Body mass index is 28.32 kg/m. Malnutrition Type:      Malnutrition Characteristics:      Nutrition Interventions:     RN Pressure Injury Documentation:     Consultants:     Procedures:     Antimicrobials:       Subjective: Patient with positive loose stools but no diarrhea, poor appetite, no nausea or vomiting, dyspnea has been improving.   Objective: Vitals:   01/25/19 1636 01/25/19 2100 01/26/19 0600 01/26/19 0744  BP: 131/76 (!) 98/55 108/63 109/77  Pulse: 71 (!) 56 62 71  Resp: 18 16 16 17   Temp: 97.6 F (36.4 C) (!) 97.5 F (36.4 C) 97.8 F (36.6 C) 99.3 F (37.4 C)  TempSrc: Oral Oral Oral Oral  SpO2: 90%  90%  94%  Weight:      Height:        Intake/Output Summary (Last 24 hours) at 01/26/2019 [id number] Last data filed at 01/26/2019 0000 Gross per 24 hour  Intake 700 ml  Output 350 ml  Net 350 ml   Filed Weights   01/22/19 2018  Weight: 77.2 kg    Examination:   General: Not in pain or dyspnea, deconditioned and ill looking appearing  Neurology: Awake and alert, non focal  E ENT: positive pallor, no icterus, oral mucosa moist Cardiovascular: No JVD. S1-S2 present, rhythmic, no gallops, rubs, or murmurs. No lower extremity edema. Pulmonary: positive breath sounds bilaterally. Gastrointestinal. Abdomen with no organomegaly, non tender, no rebound or guarding Skin. No rashes Musculoskeletal: no joint deformities     Data Reviewed: I have personally reviewed following labs and imaging studies  CBC: Recent Labs  Lab 01/22/19 1200 01/23/19 0140  WBC 10.0 8.0  NEUTROABS 8.3* 6.9  HGB 15.6 14.3  HCT 48.8 44.6  MCV 86.2 84.5  PLT 160 [id number]*   Basic Metabolic Panel: Recent Labs  Lab 01/22/19 1200 01/23/19 0140 01/24/19 0000 01/25/19 0538 01/26/19 0213  NA 137 137 139 142 139  K 3.9 4.3 4.3 4.6 4.4  CL 102 103 105 107 103  CO2 26 24 23 25 24   GLUCOSE 93 142* 132* 115* 104*  BUN 17 18 28* 27* 27*  CREATININE 1.11 0.84 0.77 0.77 0.74  CALCIUM 8.6* 8.6* 8.8* 9.0 8.8*  MG  --  2.0  --   --   --   PHOS  --  4.3  --   --   --    GFR: Estimated Creatinine Clearance: 83.6 mL/min (by C-G formula based on SCr of 0.74 mg/dL). Liver Function Tests: Recent Labs  Lab 01/22/19 1200 01/23/19 0140 01/24/19 0000 01/25/19 0538 01/26/19 0213  AST 37 48* 44* 42* 52*  ALT 48* 53* 50* 56* 73*  ALKPHOS 104 92 82 78 78  BILITOT 1.0 0.7 0.6 0.9 0.6  PROT 7.3 6.7 6.4* 6.4* 6.4*  ALBUMIN 3.5 2.9* 2.9* 2.9* 2.7*   No results for input(s): LIPASE, AMYLASE in the last 168 hours. No results for input(s): AMMONIA in the last 168 hours. Coagulation Profile: No results for input(s): INR,  PROTIME in the last 168 hours. Cardiac Enzymes: No results for input(s): CKTOTAL, CKMB, CKMBINDEX, TROPONINI in the last 168 hours. BNP (last 3 results) No results for input(s): PROBNP in the last 8760 hours. HbA1C: No results for input(s): HGBA1C in the last 72 hours. CBG: Recent Labs  Lab 01/25/19 0759 01/25/19 1208 01/25/19 1635 01/25/19 2103 01/26/19 0749  GLUCAP 100* 137* 154* 118* 92   Lipid Profile: No results for input(s): CHOL, HDL, LDLCALC, TRIG, CHOLHDL, LDLDIRECT in the last 72 hours. Thyroid Function Tests: No results for input(s): TSH, T4TOTAL, FREET4, T3FREE, THYROIDAB in the last 72 hours. Anemia Panel: Recent Labs    01/25/19 0538 01/26/19 0213  FERRITIN 734* 726*      Radiology Studies: I have reviewed all of the imaging during this hospital visit personally     Scheduled Meds: . atorvastatin  80 mg Oral Daily  . dexamethasone (DECADRON) injection  6 mg Intravenous Q24H  . enoxaparin (LOVENOX) injection  40 mg Subcutaneous Q24H  . fluticasone furoate-vilanterol  1 puff Inhalation Daily  . insulin aspart  0-9 Units Subcutaneous TID WC  . pantoprazole  40 mg Oral Daily  . tiotropium  18 mcg Inhalation Daily   Continuous Infusions: . remdesivir 100 mg in NS 250 mL Stopped (01/25/19 0945)     LOS: 4 days        Salene Mohamud Gerome Apley, MD

## 2019-01-26 NOTE — Evaluation (Addendum)
Occupational Therapy Evaluation Patient Details Name: David Barber MRN: [id number] DOB: 1950/09/14 Today's Date: 01/26/2019    History of Present Illness 68 y/o male w/ hx HTN, GERD, DM, COPD, Bronchitis, admitted to the hospital working diagnosis of SARS COVID-19 viral pneumonia complicated by acute hypoxic respiratory failure. Pt is Nepali speaking only   Clinical Impression   Pt admitted with above diagnoses, presenting with compromise in cardiopulmonary status. Attempted to use video interpreter with increased wait time. Pt signaled for OT to not use, wanting to walk. Home set up information required from PT whom used interpreter for information. PTA pt fully independent. At time of eval, he is independent in bed mobility. He is able to untangle and manage own lines as well. Pt on 4L [place] throughout session with saturations remaining in mid 80s to high 90s. Gestoral cues for pursed lip breathing given. Pt able to complete functional mobility with supervision level of assist. Still appears to fatigue more quickly than baseline. He appears to be able to complete BADL at mod I, but continues to need ECS education. Will benefit from acute OT to return to functional independence while admitted. No OT follow up indicated post acute.     Follow Up Recommendations  No OT follow up    Equipment Recommendations  None recommended by OT    Recommendations for Other Services       Precautions / Restrictions Precautions Precautions: Fall Precaution Comments: speaks Nepali only Restrictions Weight Bearing Restrictions: No      Mobility Bed Mobility Overal bed mobility: Independent                Transfers Overall transfer level: Modified independent               General transfer comment: pt starting to manage own lines, unplugging and re plugging certain ones- very independent    Balance Overall balance assessment: No apparent balance deficits (not formally assessed)                                          ADL either performed or assessed with clinical judgement   ADL Overall ADL's : Modified independent                                       General ADL Comments: pt is mod I with BADL and very functional with mobility. Needs continued education in ECS strategies to apply to BADL while cardiopulmonary system is compromised     Vision Patient Visual Report: No change from baseline       Perception     Praxis      Pertinent Vitals/Pain Pain Assessment: No/denies pain     Hand Dominance     Extremity/Trunk Assessment Upper Extremity Assessment Upper Extremity Assessment: Overall WFL for tasks assessed   Lower Extremity Assessment Lower Extremity Assessment: Overall WFL for tasks assessed       Communication Communication Communication: Prefers language other than English;Other (comment)(nepali speaking)   Cognition Arousal/Alertness: Awake/alert Behavior During Therapy: WFL for tasks assessed/performed Overall Cognitive Status: Within Functional Limits for tasks assessed  General Comments       Exercises     Shoulder Instructions      Home Living Family/patient expects to be discharged to:: Private residence Living Arrangements: Spouse/significant other;Children Available Help at Discharge: Family Type of Home: House Home Access: Level entry     Home Layout: One level               Home Equipment: Kasandra Knudsen - single point   Additional Comments: states lives home with a lot of family members and most of whom tested positive for virus, also states his son is disabled      Prior Functioning/Environment Level of Independence: Independent with assistive device(s)        Comments: was very independent until recently when became more and more debilitated and using cane, also report sof having dizzy spells at times (denies them this day)         OT Problem List: Decreased knowledge of use of DME or AE;Cardiopulmonary status limiting activity;Decreased activity tolerance      OT Treatment/Interventions: Self-care/ADL training;Therapeutic exercise;Patient/family education;Energy conservation;Therapeutic activities;DME and/or AE instruction    OT Goals(Current goals can be found in the care plan section) Acute Rehab OT Goals Patient Stated Goal: wanting to go home OT Goal Formulation: With patient Time For Goal Achievement: 02/09/19 Potential to Achieve Goals: Good  OT Frequency: Min 2X/week   Barriers to D/C:            Co-evaluation              AM-PAC OT "6 Clicks" Daily Activity     Outcome Measure Help from another person eating meals?: None Help from another person taking care of personal grooming?: A Little Help from another person toileting, which includes using toliet, bedpan, or urinal?: None Help from another person bathing (including washing, rinsing, drying)?: A Little Help from another person to put on and taking off regular upper body clothing?: None Help from another person to put on and taking off regular lower body clothing?: None 6 Click Score: 22   End of Session Equipment Utilized During Treatment: Oxygen Nurse Communication: Mobility status  Activity Tolerance: Patient tolerated treatment well Patient left: in bed;with call bell/phone within reach  OT Visit Diagnosis: Other abnormalities of gait and mobility (R26.89);Other (comment)(cardiopulmonary status limiting BADL)                Time: [id number] OT Time Calculation (min): 14 min Charges:  OT General Charges $OT Visit: 1 Visit OT Evaluation $OT Eval Low Complexity: 1 Low  Zenovia Jarred, MSOT, OTR/L [place] OT/ Acute Relief OT [facility] Office: [phone]  Zenovia Jarred 01/26/2019, 1:19 PM

## 2019-01-27 DIAGNOSIS — U071 COVID-19: Secondary | ICD-10-CM | POA: Diagnosis not present

## 2019-01-27 DIAGNOSIS — J449 Chronic obstructive pulmonary disease, unspecified: Secondary | ICD-10-CM | POA: Diagnosis not present

## 2019-01-27 DIAGNOSIS — E1149 Type 2 diabetes mellitus with other diabetic neurological complication: Secondary | ICD-10-CM | POA: Diagnosis not present

## 2019-01-27 DIAGNOSIS — E119 Type 2 diabetes mellitus without complications: Secondary | ICD-10-CM | POA: Diagnosis not present

## 2019-01-27 LAB — COMPREHENSIVE METABOLIC PANEL
ALT: 71 U/L — ABNORMAL HIGH (ref 0–44)
AST: 45 U/L — ABNORMAL HIGH (ref 15–41)
Albumin: 2.9 g/dL — ABNORMAL LOW (ref 3.5–5.0)
Alkaline Phosphatase: 78 U/L (ref 38–126)
Anion gap: 12 (ref 5–15)
BUN: 28 mg/dL — ABNORMAL HIGH (ref 8–23)
CO2: 24 mmol/L (ref 22–32)
Calcium: 9.2 mg/dL (ref 8.9–10.3)
Chloride: 103 mmol/L (ref 98–111)
Creatinine, Ser: 0.83 mg/dL (ref 0.61–1.24)
GFR calc Af Amer: >60 mL/min (ref >60)
GFR calc non Af Amer: >60 mL/min (ref >60)
Glucose, Bld: 118 mg/dL — ABNORMAL HIGH (ref 70–99)
Potassium: 4.6 mmol/L (ref 3.5–5.1)
Sodium: 139 mmol/L (ref 135–145)
Total Bilirubin: 0.8 mg/dL (ref 0.3–1.2)
Total Protein: 6.8 g/dL (ref 6.5–8.1)

## 2019-01-27 LAB — C-REACTIVE PROTEIN: CRP: 0.9 mg/dL (ref <1.0)

## 2019-01-27 LAB — CULTURE, BLOOD (ROUTINE X 2)
Culture: NO GROWTH
Culture: NO GROWTH
Special Requests: ADEQUATE

## 2019-01-27 LAB — GLUCOSE, CAPILLARY
Glucose-Capillary: 83 mg/dL (ref 70–99)
Glucose-Capillary: 117 mg/dL — ABNORMAL HIGH (ref 70–99)

## 2019-01-27 LAB — D-DIMER, QUANTITATIVE: D-Dimer, Quant: 0.58 ug/mL-FEU — ABNORMAL HIGH (ref 0.00–0.50)

## 2019-01-27 LAB — FERRITIN: Ferritin: 585 ng/mL — ABNORMAL HIGH (ref 24–336)

## 2019-01-27 NOTE — TOC Transition Note (Addendum)
Transition of Care [facility]) - CM/SW Discharge Note Marvetta Gibbons RN, BSN [facility] of [place] W/E coverage remote- RN Case Manager [phone]   Patient Details  Name: David Barber MRN: [id number] Date of Birth: 03/28/1950  [facility] CM/SW Contact:  Dawayne Patricia, RN Phone Number: 01/27/2019, 11:09 AM   Clinical Narrative:    Pt stable for transition home today, per MD pt will need home 02- orders have been placed- call placed to contact in epic- which is pt's SIL- discussed discharge and home 02 needs- confirmed address with SIL and explained home 02 arrangements with [employer] for home 02- SIL voiced understanding and states someone will be home for delivery- once home 02 equipment has been confirmed- SIL has requested transportation home as family is in quarantine. CM will arrange [facility] transport home once home 02 delivery confirmed later this afternoon. Call made to Magda Paganini with [employer] for home 02 DME needs- [employer] will work on home equipment delivery for discharge home today. Msg sent to bedside RN on transition plan. Paperwork for [facility] prepared and printed to unit. Spoke with Financial controller to be sure paperwork is there for transport.   1400- [facility] called for transport- charge RN aware and to let bedside RN know transport called.   Final next level of care: Home/Self Care Barriers to Discharge: No Barriers Identified   Patient Goals and CMS Choice Patient states their goals for this hospitalization and ongoing recovery are:: home      Discharge Placement                 Home/self care      Discharge Plan and Services   Discharge Planning Services: CM Consult Post Acute Care Choice: Durable Medical Equipment          DME Arranged: Oxygen DME Agency: [place] Date DME Agency Contacted: 01/27/19 Time DME Agency Contacted: [phone] Representative spoke with at DME Agency: Learta Codding HH Arranged: NA [place] Agency: NA        Social  Determinants of Health ([place]) Interventions     Readmission Risk Interventions Readmission Risk Prevention Plan 01/27/2019  Post Dischage Appt Complete  Medication Screening Complete  Transportation Screening Complete  Some recent data might be hidden

## 2019-01-27 NOTE — Progress Notes (Signed)
SATURATION QUALIFICATIONS: (This note is used to comply with regulatory documentation for home oxygen)  Patient Saturations on Room Air at Rest = 83%  Patient Saturations on Room Air while Ambulating = 84%  Patient Saturations on 3 Liters of oxygen while Ambulating = 91%  Please briefly explain why patient needs home oxygen:  Patient desaturates on room air and with exertion on oxygen.

## 2019-01-27 NOTE — Discharge Summary (Signed)
Physician Discharge Summary  David Barber [place] [id number] DOB: Aug 20, 1950 DOA: 01/22/2019  PCP: Charlott Rakes, MD  Admit date: 01/22/2019 Discharge date: 01/27/2019  Admitted From: Home  Disposition:  Home   Recommendations for Outpatient Follow-up and new medication changes:  1. Follow up with Dr. Margarita Rana in 2 weeks.  2. Please continue self quarantine for 2 weeks, use a mask in public and maintain physical distancing.  3. Recommended pulmonary function testing as an outpatient. 4. Patient has been discharged on submental oxygen per nasal cannula. 5. Follow-up LFTs as an outpatient.   Home Health: no   Equipment/Devices: home 02    Discharge Condition: stable  CODE STATUS: full  Diet recommendation: heart healthy and diabetic prudent.   Brief/Interim Summary: 68 year old male who presented with dyspnea and fever. He does have significant past medical history for COPD, GERD, hypertension,  diabetes mellitus type 2 and pulmonary fibrosis. Patient reports 2-day history of fever, dyspnea, headaches, generalized weakness and dizziness. On hisinitial physical examination his temperature was 99.7, heart rate 107, blood pressure 102/44, respiratory rate 25, oxygen saturation 86% on room air. His lungs had decreased air entry, heart S1-S2 present rhythmic, abdomen soft, no lower extremity edema. Sodium 137, potassium 3.9, chloride 102, bicarb 26, glucose 93, BUN 17, creatinine 1.1, white count 10.0, hemoglobin 15.6, hematocrit 48.8,platelet 160. COVID-19 was positive. Chest radiograph with bilateral interstitial infiltrates, predominantly left lower lobe. EKG 109 bpm, normal axis, normal intervals, sinus rhythm, poor R wave progression, no ST segment or T wave changes.  Patient was admitted to the hospital working diagnosis of SARS COVID-19 viral pneumonia complicated by acute hypoxic respiratory failure.  Patient responded well to medical therapy with remdesivir and systemic  corticosteroids, but continue to have supplemental oxygen requirements up 2 to 4 LPM.  On room air his oxygenation at rest is 87%, ambulating 80% requiring 2 L per nasal cannula to reach 89%.    1.  Acute hypoxic respiratory failure due to SARS COVID-19 viral pneumonia.  Patient was admitted to the medical ward, he received supplemental oxygen per nasal cannula and was treated with remdesivir and intravenous corticosteroids with dexamethasone.  He received antitussive agents, bronchodilators and airway clearance techniques with flutter valve incentive spirometer.  Vitamin C and zinc.  His inflammatory markers and symptoms improved, but he continued to have increased oxygen requirements, needing supplemental oxygen per nasal cannula.  His oximetry on room air on ambulation reached 80%.  Will discharge patient with home oxygen (2LPM), likely underlying pulmonary fibrosis is related to his persistent hypoxemia.  His oximetry at discharge is 95% on 2 L of supplemental oxygen per nasal cannula.   2.  COPD/pulmonary fibrosis.  No signs of acute exacerbation, continue bronchodilator therapy.  It is likely that his persistent hypoxemia is related to chronic lung disease, patient will be discharged on supplemental home oxygen.  I will recommend him to get a full pulmonary function testing as an outpatient.  3.  Elevated liver enzymes/likely Covid related.  AST and ALT were monitored during his hospitalization, at discharge his AST is 45 and ALT 71, recommend outpatient follow-up.  4.  Type 2 diabetes mellitus with steroid induced hyperglycemia.  Patient received insulin sliding scale for glucose coverage monitoring.  His discharge fasting glucose is 118.  5.  GERD.  Continue with pantoprazole.   6. Dyslipidemia. Patient on atorvastatin.    Discharge Diagnoses:  Principal Problem:   Pneumonia due to severe acute respiratory syndrome coronavirus 2 (SARS-CoV-2) Active Problems:  Diabetes type 2,  controlled ([place])   GERD (gastroesophageal reflux disease)   COPD, mild ([place])   Diabetic neuropathy ([place])    Discharge Instructions   Allergies as of 01/27/2019   No Known Allergies     Medication List    STOP taking these medications   ivermectin 3 MG Tabs tablet Commonly known as: STROMECTOL   predniSONE 20 MG tablet Commonly known as: DELTASONE     TAKE these medications   albuterol 108 (90 Base) MCG/ACT inhaler Commonly known as: VENTOLIN HFA Inhale 2 puffs into the lungs every 6 (six) hours as needed for wheezing or shortness of breath.   aspirin EC 81 MG tablet Take 1 tablet (81 mg total) by mouth daily.   atorvastatin 80 MG tablet Commonly known as: LIPITOR Take 1 tablet (80 mg total) by mouth daily.   buPROPion 150 MG 12 hr tablet Commonly known as: Wellbutrin SR Take 1 tablet (150 mg total) by mouth 2 (two) times daily.   cetirizine 10 MG tablet Commonly known as: ZYRTEC Take 1 tablet (10 mg total) by mouth daily.   diphenhydrAMINE 25 MG tablet Commonly known as: BENADRYL Take 1 tablet (25 mg total) by mouth every 6 (six) hours as needed.   ergocalciferol 1.25 MG (50000 UT) capsule Commonly known as: Drisdol Take 1 capsule (50,000 Units total) by mouth once a week.   Fluticasone-Salmeterol 250-50 MCG/DOSE Aepb Commonly known as: Advair Diskus Inhale 1 puff into the lungs 2 (two) times daily.   gabapentin 300 MG capsule Commonly known as: NEURONTIN Take 1 capsule (300 mg total) by mouth at bedtime.   hydrOXYzine 25 MG tablet Commonly known as: ATARAX/VISTARIL Take 1 tablet (25 mg total) by mouth 3 (three) times daily as needed. For itching   ketotifen 0.025 % ophthalmic solution Commonly known as: ZADITOR Place 1 drop into both eyes 2 (two) times daily.   loperamide 2 MG capsule Commonly known as: IMODIUM Take 1 capsule (2 mg total) by mouth 4 (four) times daily as needed for diarrhea or loose stools.   metFORMIN 500 MG 24 hr  tablet Commonly known as: Glucophage XR Take 1 tablet (500 mg total) by mouth 2 (two) times daily.   methocarbamol 500 MG tablet Commonly known as: Robaxin Take 1 tablet (500 mg total) by mouth 2 (two) times daily as needed for muscle spasms.   permethrin 5 % cream Commonly known as: ELIMITE Apply 1 application topically once.   Spiriva HandiHaler 18 MCG inhalation capsule Generic drug: tiotropium Place 1 capsule (18 mcg total) into inhaler and inhale daily.   triamcinolone cream 0.1 % Commonly known as: KENALOG Apply 1 application topically 2 (two) times daily.            Durable Medical Equipment  (From admission, onward)         Start     Ordered   01/27/19 1026  DME Oxygen  Once    Question Answer Comment  Length of Need 6 Months   Mode or (Route) Nasal cannula   Liters per Minute 2   Frequency Continuous (stationary and portable oxygen unit needed)   Oxygen conserving device Yes   Oxygen delivery system Gas      01/27/19 1026          No Known Allergies  Consultations:     Procedures/Studies: Dg Chest Port 1 View  Result Date: 01/22/2019 CLINICAL DATA:  Shortness of breath with cough and fever. EXAM: PORTABLE CHEST 1 VIEW COMPARISON:  Aug 18, 2018 FINDINGS: There is patchy airspace opacity in the right mid lung as well as in the left mid lower lung zones. There is a degree of underlying fibrosis in the bases. Heart is upper normal in size with pulmonary vascularity normal. No adenopathy. No bone lesions IMPRESSION: Patchy airspace opacity superimposed on areas of fibrosis. Suspect atypical organism superimposed on chronic lung disease. Heart size is upper normal, stable.  No evident adenopathy. Electronically Signed   By: Lowella Grip III M.D.   On: 01/22/2019 13:24      Procedures:   Subjective: Patient is feeling better, dyspnea has been improving, no nausea or vomiting. No chest pain.   Discharge Exam: Vitals:   01/27/19 0600 01/27/19  0802  BP: (!) 93/55 97/69  Pulse:  72  Resp:  16  Temp: (!) 97.1 F (36.2 C) 98 F (36.7 C)  SpO2:  95%   Vitals:   01/26/19 1529 01/26/19 2052 01/27/19 0600 01/27/19 0802  BP: 96/61  (!) 93/55 97/69  Pulse: 80   72  Resp: 16   16  Temp: 97.6 F (36.4 C) (!) 97.5 F (36.4 C) (!) 97.1 F (36.2 C) 98 F (36.7 C)  TempSrc: Oral Oral Oral Oral  SpO2: 94%   95%  Weight:      Height:        General: Not in pain or dyspnea, deconditioned  Neurology: Awake and alert, non focal  E ENT: mild pallor, no icterus, oral mucosa moist Cardiovascular: No JVD. S1-S2 present, rhythmic, no gallops, rubs, or murmurs. No lower extremity edema. Pulmonary: positive breath sounds bilaterally. Gastrointestinal. Abdomen with no organomegaly, non tender, no rebound or guarding Skin. No rashes Musculoskeletal: no joint deformities   The results of significant diagnostics from this hospitalization (including imaging, microbiology, ancillary and laboratory) are listed below for reference.     Microbiology: Recent Results (from the past 240 hour(s))  Blood Culture (routine x 2)     Status: None   Collection Time: 01/22/19 12:02 PM   Specimen: BLOOD  Result Value Ref Range Status   Specimen Description   Final    BLOOD BLOOD LEFT FOREARM Performed at [facility] [address], [place], [place] [address]    Special Requests   Final    BOTTLES DRAWN AEROBIC AND ANAEROBIC Blood Culture adequate volume Performed at [facility] [address], [place], [place] [address]    Culture   Final    NO GROWTH 5 DAYS Performed at [facility], [place] [address], [place], [place] [address]    Report Status 01/27/2019 FINAL  Final  Blood Culture (routine x 2)     Status: None   Collection Time: 01/22/19 12:09 PM   Specimen: BLOOD  Result Value Ref Range Status   Specimen Description   Final    BLOOD RIGHT ANTECUBITAL Performed at [facility] [address], [place], [place] [address]    Special Requests   Final    BOTTLES DRAWN AEROBIC AND ANAEROBIC Blood Culture results may not be optimal due to an excessive volume of blood received in culture bottles Performed at [facility] [address], [place], [place] [address]    Culture   Final    NO GROWTH 5 DAYS Performed at [facility], [place] [address] [place],  [address]    Report Status 01/27/2019 FINAL  Final  SARS Coronavirus 2 by RT PCR (  hospital order, performed in [facility] hospital lab) Nasopharyngeal Nasopharyngeal Swab     Status: Abnormal   Collection Time: 01/22/19 12:15 PM   Specimen: Nasopharyngeal Swab  Result Value Ref Range Status   SARS Coronavirus 2 POSITIVE (A) NEGATIVE Final    Comment: CRITICAL RESULT CALLED TO, READ BACK BY AND VERIFIED WITH: PATTY DOWD,RN [id number] @ [id number] BY J SCOTTON (NOTE) If result is NEGATIVE SARS-CoV-2 target nucleic acids are NOT DETECTED. The SARS-CoV-2 RNA is generally detectable in upper and lower  respiratory specimens during the acute phase of infection. The lowest  concentration of SARS-CoV-2 viral copies this assay can detect is 250  copies / mL. A negative result does not preclude SARS-CoV-2 infection  and should not be used as the sole basis for treatment or other  patient management decisions.  A negative result may occur with  improper specimen collection / handling, submission of specimen other  than nasopharyngeal swab, presence of viral mutation(s) within the  areas targeted by this assay, and inadequate number of viral copies  (<250 copies / mL). A negative result must be combined with clinical  observations, patient history, and epidemiological information. If result is POSITIVE SARS-CoV-2 target nucleic acids ar e DETECTED. The SARS-CoV-2 RNA is generally detectable in upper and lower  respiratory specimens during the acute phase of infection.  Positive  results  are indicative of active infection with SARS-CoV-2.  Clinical  correlation with patient history and other diagnostic information is  necessary to determine patient infection status.  Positive results do  not rule out bacterial infection or co-infection with other viruses. If result is PRESUMPTIVE POSTIVE SARS-CoV-2 nucleic acids MAY BE PRESENT.   A presumptive positive result was obtained on the submitted specimen  and confirmed on repeat testing.  While 2019 novel coronavirus  (SARS-CoV-2) nucleic acids may be present in the submitted sample  additional confirmatory testing may be necessary for epidemiological  and / or clinical management purposes  to differentiate between  SARS-CoV-2 and other Sarbecovirus currently known to infect humans.  If clinically indicated additional testing with an alternate test  methodolog y [phone]) is advised. The SARS-CoV-2 RNA is generally  detectable in upper and lower respiratory specimens during the acute  phase of infection. The expected result is Negative. Fact Sheet for Patients:  StrictlyIdeas.no Fact Sheet for Healthcare Providers: BankingDealers.co.za This test is not yet approved or cleared by the [place] FDA and has been authorized for detection and/or diagnosis of SARS-CoV-2 by FDA under an Emergency Use Authorization (EUA).  This EUA will remain in effect (meaning this test can be used) for the duration of the COVID-19 declaration under Section 564(b)(1) of the Act, 21 U.S.C. section 360bbb-3(b)(1), unless the authorization is terminated or revoked sooner. Performed at [facility], [place] [address], [place], [place] [address]      Labs: BNP (last 3 results) No results for input(s): BNP in the last 8760 hours. Basic Metabolic Panel: Recent Labs  Lab 01/23/19 0140 01/24/19 0000 01/25/19 0538 01/26/19 0213 01/27/19 0231  NA 137 139 142 139 139  K 4.3 4.3 4.6  4.4 4.6  CL 103 105 107 103 103  CO2 24 23 25 24 24   GLUCOSE 142* 132* 115* 104* 118*  BUN 18 28* 27* 27* 28*  CREATININE 0.84 0.77 0.77 0.74 0.83  CALCIUM 8.6* 8.8* 9.0 8.8* 9.2  MG 2.0  --   --   --   --   PHOS 4.3  --   --   --   --  Liver Function Tests: Recent Labs  Lab 01/23/19 0140 01/24/19 0000 01/25/19 0538 01/26/19 0213 01/27/19 0231  AST 48* 44* 42* 52* 45*  ALT 53* 50* 56* 73* 71*  ALKPHOS 92 82 78 78 78  BILITOT 0.7 0.6 0.9 0.6 0.8  PROT 6.7 6.4* 6.4* 6.4* 6.8  ALBUMIN 2.9* 2.9* 2.9* 2.7* 2.9*   No results for input(s): LIPASE, AMYLASE in the last 168 hours. No results for input(s): AMMONIA in the last 168 hours. CBC: Recent Labs  Lab 01/22/19 1200 01/23/19 0140  WBC 10.0 8.0  NEUTROABS 8.3* 6.9  HGB 15.6 14.3  HCT 48.8 44.6  MCV 86.2 84.5  PLT 160 149*   Cardiac Enzymes: No results for input(s): CKTOTAL, CKMB, CKMBINDEX, TROPONINI in the last 168 hours. BNP: Invalid input(s): POCBNP CBG: Recent Labs  Lab 01/26/19 0749 01/26/19 1110 01/26/19 1603 01/26/19 2042 01/27/19 0805  GLUCAP 92 111* 193* 118* 83   D-Dimer Recent Labs    01/26/19 0213 01/27/19 0231  DDIMER 0.36 0.58*   Hgb A1c No results for input(s): HGBA1C in the last 72 hours. Lipid Profile No results for input(s): CHOL, HDL, LDLCALC, TRIG, CHOLHDL, LDLDIRECT in the last 72 hours. Thyroid function studies No results for input(s): TSH, T4TOTAL, T3FREE, THYROIDAB in the last 72 hours.  Invalid input(s): FREET3 Anemia work up Recent Labs    01/26/19 0213 01/27/19 0231  FERRITIN 726* 585*   Urinalysis    Component Value Date/Time   COLORURINE YELLOW 01/22/2019 1200   APPEARANCEUR CLEAR 01/22/2019 1200   LABSPEC 1.017 01/22/2019 1200   PHURINE 5.0 01/22/2019 1200   GLUCOSEU NEGATIVE 01/22/2019 1200   HGBUR NEGATIVE 01/22/2019 1200   BILIRUBINUR NEGATIVE 01/22/2019 1200   KETONESUR NEGATIVE 01/22/2019 1200   PROTEINUR NEGATIVE 01/22/2019 1200   UROBILINOGEN 0.2  11/23/2012 1355   NITRITE NEGATIVE 01/22/2019 1200   LEUKOCYTESUR NEGATIVE 01/22/2019 1200   Sepsis Labs Invalid input(s): PROCALCITONIN,  WBC,  LACTICIDVEN Microbiology Recent Results (from the past 240 hour(s))  Blood Culture (routine x 2)     Status: None   Collection Time: 01/22/19 12:02 PM   Specimen: BLOOD  Result Value Ref Range Status   Specimen Description   Final    BLOOD BLOOD LEFT FOREARM Performed at [facility], [place] [address], [place], [place] [address]    Special Requests   Final    BOTTLES DRAWN AEROBIC AND ANAEROBIC Blood Culture adequate volume Performed at [facility] [address] [place], [place] [address]    Culture   Final    NO GROWTH 5 DAYS Performed at [facility], [place] [address], [place], [place] [address]    Report Status 01/27/2019 FINAL  Final  Blood Culture (routine x 2)     Status: None   Collection Time: 01/22/19 12:09 PM   Specimen: BLOOD  Result Value Ref Range Status   Specimen Description   Final    BLOOD RIGHT ANTECUBITAL Performed at [facility] [address], [place], [place] [address]    Special Requests   Final    BOTTLES DRAWN AEROBIC AND ANAEROBIC Blood Culture results may not be optimal due to an excessive volume of blood received in culture bottles Performed at [facility] [address] [place], [place] [address]    Culture   Final    NO GROWTH 5 DAYS Performed at [facility], [place] [address], [place], [place] [address]    Report  Status 01/27/2019 FINAL  Final  SARS Coronavirus 2 by RT PCR (hospital order, performed in [facility] hospital lab) Nasopharyngeal Nasopharyngeal Swab     Status: Abnormal   Collection Time: 01/22/19 12:15 PM   Specimen: Nasopharyngeal Swab  Result Value Ref Range Status   SARS Coronavirus 2 POSITIVE (A) NEGATIVE Final    Comment: CRITICAL RESULT CALLED TO, READ BACK BY AND VERIFIED WITH: PATTY  DOWD,RN [id number] @ [id number] BY J SCOTTON (NOTE) If result is NEGATIVE SARS-CoV-2 target nucleic acids are NOT DETECTED. The SARS-CoV-2 RNA is generally detectable in upper and lower  respiratory specimens during the acute phase of infection. The lowest  concentration of SARS-CoV-2 viral copies this assay can detect is 250  copies / mL. A negative result does not preclude SARS-CoV-2 infection  and should not be used as the sole basis for treatment or other  patient management decisions.  A negative result may occur with  improper specimen collection / handling, submission of specimen other  than nasopharyngeal swab, presence of viral mutation(s) within the  areas targeted by this assay, and inadequate number of viral copies  (<250 copies / mL). A negative result must be combined with clinical  observations, patient history, and epidemiological information. If result is POSITIVE SARS-CoV-2 target nucleic acids ar e DETECTED. The SARS-CoV-2 RNA is generally detectable in upper and lower  respiratory specimens during the acute phase of infection.  Positive  results are indicative of active infection with SARS-CoV-2.  Clinical  correlation with patient history and other diagnostic information is  necessary to determine patient infection status.  Positive results do  not rule out bacterial infection or co-infection with other viruses. If result is PRESUMPTIVE POSTIVE SARS-CoV-2 nucleic acids MAY BE PRESENT.   A presumptive positive result was obtained on the submitted specimen  and confirmed on repeat testing.  While 2019 novel coronavirus  (SARS-CoV-2) nucleic acids may be present in the submitted sample  additional confirmatory testing may be necessary for epidemiological  and / or clinical management purposes  to differentiate between  SARS-CoV-2 and other Sarbecovirus currently known to infect humans.  If clinically indicated additional testing with an alternate test  methodolog y [phone])  is advised. The SARS-CoV-2 RNA is generally  detectable in upper and lower respiratory specimens during the acute  phase of infection. The expected result is Negative. Fact Sheet for Patients:  StrictlyIdeas.no Fact Sheet for Healthcare Providers: BankingDealers.co.za This test is not yet approved or cleared by the [place] FDA and has been authorized for detection and/or diagnosis of SARS-CoV-2 by FDA under an Emergency Use Authorization (EUA).  This EUA will remain in effect (meaning this test can be used) for the duration of the COVID-19 declaration under Section 564(b)(1) of the Act, 21 U.S.C. section 360bbb-3(b)(1), unless the authorization is terminated or revoked sooner. Performed at [facility], [place] [address] [place], [place] [address]      Time coordinating discharge: 45 minutes  SIGNED:   Tawni Millers, MD  [facility] 01/27/2019, 8:36 AM

## 2019-02-06 MED FILL — ATORVASTATIN 80 MG TABLET: 80 | 30 days supply | Qty: 30 | Fill #3

## 2019-02-06 MED FILL — ADVAIR 250/50 DISKUS: 250-50 | 30 days supply | Qty: 60 | Fill #2

## 2019-02-06 MED FILL — METFORMIN HCL ER 500 MG TB2: 500 | 30 days supply | Qty: 60 | Fill #3

## 2019-02-06 MED FILL — SPIRIVA 18 MCG CP-HANDIHALE: 18 | 30 days supply | Qty: 30 | Fill #1

## 2019-03-09 ENCOUNTER — Other Ambulatory Visit: Payer: Self-pay

## 2019-03-09 ENCOUNTER — Encounter (HOSPITAL_COMMUNITY): Payer: Self-pay

## 2019-03-09 ENCOUNTER — Ambulatory Visit (HOSPITAL_COMMUNITY)
Admission: EM | Admit: 2019-03-09 | Discharge: 2019-03-09 | Disposition: A | Discharge status: Home / Self Care | Payer: Medicare Other | Attending: Urgent Care | Admitting: Urgent Care

## 2019-03-09 DIAGNOSIS — J449 Chronic obstructive pulmonary disease, unspecified: Secondary | ICD-10-CM | POA: Diagnosis not present

## 2019-03-09 DIAGNOSIS — L299 Pruritus, unspecified: Secondary | ICD-10-CM

## 2019-03-09 DIAGNOSIS — R21 Rash and other nonspecific skin eruption: Secondary | ICD-10-CM | POA: Diagnosis not present

## 2019-03-09 DIAGNOSIS — R000 Tachycardia, unspecified: Secondary | ICD-10-CM | POA: Diagnosis not present

## 2019-03-09 DIAGNOSIS — L259 Unspecified contact dermatitis, unspecified cause: Secondary | ICD-10-CM

## 2019-03-09 MED ORDER — HYDROXYZINE HCL 25 MG PO TABS: 12.5000 mg | ORAL_TABLET | Freq: Three times a day (TID) | ORAL | 0 refills | Status: DC | PRN

## 2019-03-09 MED ORDER — METHYLPREDNISOLONE SODIUM SUCC 125 MG IJ SOLR
INTRAMUSCULAR | Status: AC
  Filled 2019-03-09: qty 2

## 2019-03-09 MED ORDER — METHYLPREDNISOLONE SODIUM SUCC 125 MG IJ SOLR
125.0000 mg | Freq: Once | INTRAMUSCULAR | Status: AC
  Administered 2019-03-09: 125 mg via INTRAMUSCULAR

## 2019-03-09 NOTE — Discharge Instructions (Signed)
? ???????? ????? ?????? ????? ????????? ???? ????? ??????????? ????????? ???????? ??????? ????, ???? ???????? ????????? ?????? ????????? ?????????? ???????? ??   dermatologist ??? ???????????? ?????? ?????? ?? ?????, ????? ??????? ???? ??????????????? ?????? ???? ???????????  Ma tap?'?nl?'? ?phn? lag?t?ra r??am? par?mar?ak? l?gi tvac? vi???ajal?'? sandarbhita gardaichu. Ahil?k? l?gi, h?m? s??r?'i?a ?nj?k?ana pray?ga garn?chau?. D?rghak?l?na tap?'im?l?'? ?ka dermatologist sa?ga vyavasth?panak? ?va?yaka pardacha. Yasa b?cam?, tap?'?? khujal?k? l?gi h?'i?r?ks?'ij?na pray?ga garna saknuhuncha.

## 2019-03-09 NOTE — ED Triage Notes (Signed)
Pt present a rash that has spread all over his body. Symptoms been going on for over a month

## 2019-03-09 NOTE — ED Provider Notes (Addendum)
[place]  Attempted to the use of [employer] interpreter in [place] for ~45 minutes, patient also called family members and we were both unsuccessful. Used IT trainer for rudimentary translation.   MRN: [id number] DOB: 1951-03-11  Subjective:   David Barber is a 68 y.o. male presenting for 1 year history of persistent recurrent pruritic rash diffusely scattered over his body.  Patient states that he previously received an injection with good relief.  He has never seen a dermatologist for this.  Patient denies chest pain, difficulty breathing, abdominal pain, dizziness.  No current facility-administered medications for this encounter.  Current Outpatient Medications:  .  albuterol (PROVENTIL HFA;VENTOLIN HFA) 108 (90 Base) MCG/ACT inhaler, Inhale 2 puffs into the lungs every 6 (six) hours as needed for wheezing or shortness of breath., Disp: 1 Inhaler, Rfl: 6 .  aspirin EC 81 MG tablet, Take 1 tablet (81 mg total) by mouth daily., Disp: 30 tablet, Rfl: 5 .  atorvastatin (LIPITOR) 80 MG tablet, Take 1 tablet (80 mg total) by mouth daily., Disp: 30 tablet, Rfl: 6 .  buPROPion (WELLBUTRIN SR) 150 MG 12 hr tablet, Take 1 tablet (150 mg total) by mouth 2 (two) times daily., Disp: 60 tablet, Rfl: 3 .  cetirizine (ZYRTEC) 10 MG tablet, Take 1 tablet (10 mg total) by mouth daily., Disp: 30 tablet, Rfl: 11 .  diphenhydrAMINE (BENADRYL) 25 MG tablet, Take 1 tablet (25 mg total) by mouth every 6 (six) hours as needed., Disp: 30 tablet, Rfl: 0 .  ergocalciferol (DRISDOL) 50000 units capsule, Take 1 capsule (50,000 Units total) by mouth once a week., Disp: 9 capsule, Rfl: 1 .  Fluticasone-Salmeterol (ADVAIR DISKUS) 250-50 MCG/DOSE AEPB, Inhale 1 puff into the lungs 2 (two) times daily., Disp: 1 each, Rfl: 6 .  gabapentin (NEURONTIN) 300 MG capsule, Take 1 capsule (300 mg total) by mouth at bedtime., Disp: 30 capsule, Rfl: 6 .  hydrOXYzine (ATARAX/VISTARIL) 25 MG tablet, Take 1 tablet (25  mg total) by mouth 3 (three) times daily as needed. For itching, Disp: 60 tablet, Rfl: 0 .  ketotifen (ZADITOR) 0.025 % ophthalmic solution, Place 1 drop into both eyes 2 (two) times daily., Disp: 5 mL, Rfl: 1 .  loperamide (IMODIUM) 2 MG capsule, Take 1 capsule (2 mg total) by mouth 4 (four) times daily as needed for diarrhea or loose stools., Disp: 12 capsule, Rfl: 0 .  metFORMIN (GLUCOPHAGE XR) 500 MG 24 hr tablet, Take 1 tablet (500 mg total) by mouth 2 (two) times daily., Disp: 60 tablet, Rfl: 6 .  methocarbamol (ROBAXIN) 500 MG tablet, Take 1 tablet (500 mg total) by mouth 2 (two) times daily as needed for muscle spasms., Disp: 60 tablet, Rfl: 1 .  permethrin (ELIMITE) 5 % cream, Apply 1 application topically once., Disp: , Rfl:  .  tiotropium (SPIRIVA HANDIHALER) 18 MCG inhalation capsule, Place 1 capsule (18 mcg total) into inhaler and inhale daily., Disp: 30 capsule, Rfl: 6 .  triamcinolone cream (KENALOG) 0.1 %, Apply 1 application topically 2 (two) times daily., Disp: 45 g, Rfl: 1   No Known Allergies  Past Medical History:  Diagnosis Date  . Bronchitis   . COPD (chronic obstructive pulmonary disease) ([place])   . Diabetes mellitus without complication ([place])   . GERD (gastroesophageal reflux disease)   . Hypertension      Past Surgical History:  Procedure Laterality Date  . CYST REMOVAL NECK  2013   benign    Family History  Problem Relation  Age of Onset  . Colon cancer Neg Hx     Social History   Tobacco Use  . Smoking status: Former Smoker    Types: Cigarettes    Quit date: 10/19/2013    Years since quitting: 5.3  . Smokeless tobacco: Current User  . Tobacco comment: chewing tobacco  Substance Use Topics  . Alcohol use: Yes    Alcohol/week: 3.0 standard drinks    Types: 3 Cans of beer per week  . Drug use: No    Review of Systems  Constitutional: Negative for diaphoresis and fever.  Respiratory: Negative for shortness of breath.   Cardiovascular: Negative for  chest pain.  Gastrointestinal: Negative for abdominal pain.  Neurological: Negative for dizziness.     Objective:   Vitals: BP 119/82 (BP Location: Left Arm)   Pulse (!) 111   Temp 97.8 F (36.6 C) (Oral)   Resp 18   SpO2 96%    Physical Exam Constitutional:      General: He is not in acute distress.    Appearance: Normal appearance. He is well-developed and normal weight. He is not ill-appearing, toxic-appearing or diaphoretic.  HENT:     Head: Normocephalic and atraumatic.     Right Ear: External ear normal.     Left Ear: External ear normal.     Nose: Nose normal.     Mouth/Throat:     Mouth: Mucous membranes are moist.     Pharynx: Oropharynx is clear.  Eyes:     General: No scleral icterus.       Right eye: No discharge.        Left eye: No discharge.     Extraocular Movements: Extraocular movements intact.     Pupils: Pupils are equal, round, and reactive to light.  Cardiovascular:     Rate and Rhythm: Regular rhythm. Tachycardia present.     Heart sounds: Normal heart sounds. No murmur. No friction rub. No gallop.   Pulmonary:     Effort: Pulmonary effort is normal. No respiratory distress.     Breath sounds: Normal breath sounds. No stridor. No wheezing, rhonchi or rales.  Musculoskeletal:     Cervical back: Normal range of motion.  Skin:    General: Skin is warm and dry.     Findings: Rash (Large dry scaly patches as depicted) present.  Neurological:     Mental Status: He is alert and oriented to person, place, and time.  Psychiatric:        Mood and Affect: Mood normal.        Behavior: Behavior normal.        Thought Content: Thought content normal.        Judgment: Judgment normal.                ED ECG REPORT   Date: 03/09/2019  Rate: 106bpm   Rhythm: sinus tachycardia  QRS Axis: normal  Intervals: normal  ST/T Wave abnormalities: normal  Conduction Disutrbances:none  Narrative Interpretation: Sinus tachycardia at 106 bpm  Old  EKG Reviewed: unchanged  I have personally reviewed the EKG tracing and agree with the computerized printout as noted.   Assessment and Plan :   1. Rash and nonspecific skin eruption   2. Contact dermatitis, unspecified contact dermatitis type, unspecified trigger   3. Itching   4. Chronic obstructive pulmonary disease, unspecified COPD type ([place])   5. Tachycardia     Unfortunately not able to communicate with patient effectively but rash  Case reviewed with Dr. Mannie Stabile. Will use IM Solumedrol to address inflammatory skin condition such as eczema versus contact dermatitis versus psoriasis. Will refer to [facility] for consult with their dermatologist. Use hydroxyzine in the meantime. ECG is identical with sinus tachycardia on 01/22/2019. Needs f/u with PCP asap. Counseled patient on potential for adverse effects with medications prescribed/recommended today, ER and return-to-clinic precautions discussed, patient verbalized understanding.    Jaynee Eagles, PA-C 03/09/19 1158    Jaynee Eagles, [place] 03/09/19 1159

## 2019-03-12 MED FILL — SPIRIVA 18 MCG CP-HANDIHALE: 18 | 30 days supply | Qty: 30 | Fill #2

## 2019-03-12 MED FILL — METFORMIN HCL ER 500 MG TB2: 500 | 30 days supply | Qty: 60 | Fill #4

## 2019-03-25 MED FILL — PERMETHRIN 5% CREAM: 5 | 30 days supply | Qty: 60 | Fill #1

## 2019-04-05 ENCOUNTER — Ambulatory Visit (HOSPITAL_COMMUNITY)
Admission: EM | Admit: 2019-04-05 | Discharge: 2019-04-05 | Disposition: A | Discharge status: Home / Self Care | Payer: Medicare Other | Attending: Urgent Care | Admitting: Urgent Care

## 2019-04-05 ENCOUNTER — Encounter (HOSPITAL_COMMUNITY): Payer: Self-pay

## 2019-04-05 ENCOUNTER — Other Ambulatory Visit: Payer: Self-pay

## 2019-04-05 DIAGNOSIS — R21 Rash and other nonspecific skin eruption: Secondary | ICD-10-CM | POA: Diagnosis not present

## 2019-04-05 DIAGNOSIS — L299 Pruritus, unspecified: Secondary | ICD-10-CM

## 2019-04-05 MED ORDER — HYDROXYZINE HCL 25 MG PO TABS: 25.0000 mg | ORAL_TABLET | Freq: Three times a day (TID) | ORAL | 0 refills | Status: DC | PRN

## 2019-04-05 MED ORDER — PREDNISONE 20 MG PO TABS: ORAL_TABLET | ORAL | 0 refills | Status: DC

## 2019-04-05 NOTE — ED Provider Notes (Addendum)
[place]   MRN: [id number] DOB: 10-05-1950  Subjective:   David Barber is a 69 y.o. male presenting for 6 month history of persistent itchy rash over his limbs, torso. Previously patient received an injection with good relief but his rash has not resolved and has worsened again.  Last OV was 03/09/2019, received IM injection of Solumedrol. It helped the patient for a week, rash has return.  Of note, he does have an appointment with a dermatologist on April 19, 2019.  No current facility-administered medications for this encounter.  Current Outpatient Medications:  .  albuterol (PROVENTIL HFA;VENTOLIN HFA) 108 (90 Base) MCG/ACT inhaler, Inhale 2 puffs into the lungs every 6 (six) hours as needed for wheezing or shortness of breath., Disp: 1 Inhaler, Rfl: 6 .  aspirin EC 81 MG tablet, Take 1 tablet (81 mg total) by mouth daily., Disp: 30 tablet, Rfl: 5 .  atorvastatin (LIPITOR) 80 MG tablet, Take 1 tablet (80 mg total) by mouth daily., Disp: 30 tablet, Rfl: 6 .  buPROPion (WELLBUTRIN SR) 150 MG 12 hr tablet, Take 1 tablet (150 mg total) by mouth 2 (two) times daily., Disp: 60 tablet, Rfl: 3 .  cetirizine (ZYRTEC) 10 MG tablet, Take 1 tablet (10 mg total) by mouth daily., Disp: 30 tablet, Rfl: 11 .  diphenhydrAMINE (BENADRYL) 25 MG tablet, Take 1 tablet (25 mg total) by mouth every 6 (six) hours as needed., Disp: 30 tablet, Rfl: 0 .  ergocalciferol (DRISDOL) 50000 units capsule, Take 1 capsule (50,000 Units total) by mouth once a week., Disp: 9 capsule, Rfl: 1 .  Fluticasone-Salmeterol (ADVAIR DISKUS) 250-50 MCG/DOSE AEPB, Inhale 1 puff into the lungs 2 (two) times daily., Disp: 1 each, Rfl: 6 .  gabapentin (NEURONTIN) 300 MG capsule, Take 1 capsule (300 mg total) by mouth at bedtime., Disp: 30 capsule, Rfl: 6 .  hydrOXYzine (ATARAX/VISTARIL) 25 MG tablet, Take 0.5-1 tablets (12.5-25 mg total) by mouth every 8 (eight) hours as needed for itching., Disp: 60 tablet, Rfl: 0 .   ketotifen (ZADITOR) 0.025 % ophthalmic solution, Place 1 drop into both eyes 2 (two) times daily., Disp: 5 mL, Rfl: 1 .  loperamide (IMODIUM) 2 MG capsule, Take 1 capsule (2 mg total) by mouth 4 (four) times daily as needed for diarrhea or loose stools., Disp: 12 capsule, Rfl: 0 .  metFORMIN (GLUCOPHAGE XR) 500 MG 24 hr tablet, Take 1 tablet (500 mg total) by mouth 2 (two) times daily., Disp: 60 tablet, Rfl: 6 .  methocarbamol (ROBAXIN) 500 MG tablet, Take 1 tablet (500 mg total) by mouth 2 (two) times daily as needed for muscle spasms., Disp: 60 tablet, Rfl: 1 .  permethrin (ELIMITE) 5 % cream, Apply 1 application topically once., Disp: , Rfl:  .  tiotropium (SPIRIVA HANDIHALER) 18 MCG inhalation capsule, Place 1 capsule (18 mcg total) into inhaler and inhale daily., Disp: 30 capsule, Rfl: 6 .  triamcinolone cream (KENALOG) 0.1 %, Apply 1 application topically 2 (two) times daily., Disp: 45 g, Rfl: 1   No Known Allergies  Past Medical History:  Diagnosis Date  . Bronchitis   . COPD (chronic obstructive pulmonary disease) ([place])   . Diabetes mellitus without complication ([place])   . GERD (gastroesophageal reflux disease)   . Hypertension      Past Surgical History:  Procedure Laterality Date  . CYST REMOVAL NECK  2013   benign    Family History  Problem Relation Age of Onset  . Colon cancer Neg Hx  Social History   Tobacco Use  . Smoking status: Former Smoker    Types: Cigarettes    Quit date: 10/19/2013    Years since quitting: 5.4  . Smokeless tobacco: Current User  . Tobacco comment: chewing tobacco  Substance Use Topics  . Alcohol use: Yes    Alcohol/week: 3.0 standard drinks    Types: 3 Cans of beer per week  . Drug use: No    ROS   Objective:   Vitals: BP (!) 144/81 (BP Location: Right Arm)   Pulse 93   Temp 97.6 F (36.4 C) (Oral)   SpO2 99%   Physical Exam Constitutional:      General: He is not in acute distress.    Appearance: Normal appearance. He  is well-developed and normal weight. He is not ill-appearing, toxic-appearing or diaphoretic.  HENT:     Head: Normocephalic and atraumatic.     Right Ear: External ear normal.     Left Ear: External ear normal.     Nose: Nose normal.     Mouth/Throat:     Mouth: Mucous membranes are moist.     Pharynx: Oropharynx is clear.  Eyes:     General: No scleral icterus.       Right eye: No discharge.        Left eye: No discharge.     Extraocular Movements: Extraocular movements intact.     Pupils: Pupils are equal, round, and reactive to light.  Cardiovascular:     Rate and Rhythm: Normal rate and regular rhythm.     Heart sounds: Normal heart sounds. No murmur. No friction rub. No gallop.   Pulmonary:     Effort: Pulmonary effort is normal. No respiratory distress.     Breath sounds: Normal breath sounds. No stridor. No wheezing, rhonchi or rales.  Musculoskeletal:     Cervical back: Normal range of motion.  Skin:    General: Skin is warm and dry.     Findings: Rash (Recurrence of diffusely scattered large dry scaly patches with mild erythema over his torso and axillary areas) present.  Neurological:     Mental Status: He is alert and oriented to person, place, and time.  Psychiatric:        Mood and Affect: Mood normal.        Behavior: Behavior normal.        Thought Content: Thought content normal.        Judgment: Judgment normal.       Assessment and Plan :   1. Rash and nonspecific skin eruption   2. Itching     Counseled patient against repeating an IM injection of Solu-Medrol as he just had this a month ago.  He did get significant relief from this but would prefer to use an oral steroid course with hydroxyzine prior to his visit with the dermatologist.  Patient was agreeable to this. Counseled patient on potential for adverse effects with medications prescribed/recommended today, ER and return-to-clinic precautions discussed, patient verbalized understanding.      Jaynee Eagles, PA-C 04/05/19 1512

## 2019-04-05 NOTE — ED Triage Notes (Signed)
Pt c/o itching, rash diffusely across body: bilat extremities, trunk x 6-7 months. Denies any difficulty swallowing, swelling to face. States has had injection for same with improvement in sx. Requests another injection, specifically asks if injection can be done "daily".  Request refill for Rx that was given for rash.

## 2019-04-05 NOTE — Discharge Instructions (Addendum)
??????????   Pr??anis?na ??? ?--5: ????? ? ????????? ????????? ??? -10-??: ????? ? ????????? ????????? ??????? ?????? ????????? ?????????  Dina 1--5Becky [place] 2 ?y?bl??a linuh?s. Dina -10-10: Dainika 1 ?y?bl??a linuh?s. Bih?nak? kh?j?m? ?y?bl??a linuh?s.  ??????????????? ??????? ???? ????? ????? 1 ????????? ????? 3 ??? ????????? H?'i?r?ksij?'ina khujal?k? l?gi dainika r?pam? 1 ?y?bl??a dainika 3 pa?aka linuh?s.

## 2019-04-15 MED FILL — metFORMIN HCL ER 500 MG TB2: 500 | 30 days supply | Qty: 60 | Fill #5

## 2019-04-15 MED FILL — SPIRIVA 18 MCG CP-HANDIHALE: 18 | 30 days supply | Qty: 30 | Fill #3

## 2019-05-02 MED FILL — ADVAIR 250/50 DISKUS: 250-50 | 30 days supply | Qty: 60 | Fill #3

## 2019-05-14 MED FILL — SPIRIVA 18 MCG CP-HANDIHALE: 18 | 30 days supply | Qty: 30 | Fill #4

## 2019-05-28 ENCOUNTER — Encounter: Payer: Self-pay | Admitting: Family Medicine

## 2019-05-28 ENCOUNTER — Ambulatory Visit: Payer: Medicare Other | Attending: Family Medicine | Admitting: Family Medicine

## 2019-05-28 VITALS — BP 103/66 | HR 100 | Ht 65.0 in | Wt 169.2 lb

## 2019-05-28 DIAGNOSIS — E1169 Type 2 diabetes mellitus with other specified complication: Secondary | ICD-10-CM

## 2019-05-28 DIAGNOSIS — Z7982 Long term (current) use of aspirin: Secondary | ICD-10-CM | POA: Diagnosis not present

## 2019-05-28 DIAGNOSIS — Z01818 Encounter for other preprocedural examination: Secondary | ICD-10-CM | POA: Insufficient documentation

## 2019-05-28 DIAGNOSIS — I10 Essential (primary) hypertension: Secondary | ICD-10-CM | POA: Diagnosis not present

## 2019-05-28 DIAGNOSIS — Z7984 Long term (current) use of oral hypoglycemic drugs: Secondary | ICD-10-CM | POA: Diagnosis not present

## 2019-05-28 DIAGNOSIS — Z79899 Other long term (current) drug therapy: Secondary | ICD-10-CM | POA: Insufficient documentation

## 2019-05-28 DIAGNOSIS — E119 Type 2 diabetes mellitus without complications: Secondary | ICD-10-CM | POA: Diagnosis not present

## 2019-05-28 DIAGNOSIS — R21 Rash and other nonspecific skin eruption: Secondary | ICD-10-CM | POA: Insufficient documentation

## 2019-05-28 DIAGNOSIS — E785 Hyperlipidemia, unspecified: Secondary | ICD-10-CM | POA: Diagnosis not present

## 2019-05-28 DIAGNOSIS — J449 Chronic obstructive pulmonary disease, unspecified: Secondary | ICD-10-CM | POA: Insufficient documentation

## 2019-05-28 DIAGNOSIS — L309 Dermatitis, unspecified: Secondary | ICD-10-CM | POA: Insufficient documentation

## 2019-05-28 LAB — POCT GLYCOSYLATED HEMOGLOBIN (HGB A1C): HbA1c, POC (controlled diabetic range): 5.9 % (ref 0.0–7.0)

## 2019-05-28 LAB — GLUCOSE, POCT (MANUAL RESULT ENTRY): POC Glucose: 108 mg/dl — AB (ref 70–99)

## 2019-05-28 MED ORDER — SPIRIVA HANDIHALER 18 MCG IN CAPS: 18.0000 ug | ORAL_CAPSULE | Freq: Every day | RESPIRATORY_TRACT | 6 refills | Status: DC

## 2019-05-28 MED ORDER — FLUTICASONE-SALMETEROL 250-50 MCG/DOSE IN AEPB: 1.0000 | INHALATION_SPRAY | Freq: Two times a day (BID) | RESPIRATORY_TRACT | 6 refills | Status: DC

## 2019-05-28 MED ORDER — ATORVASTATIN CALCIUM 80 MG PO TABS: 80.0000 mg | ORAL_TABLET | Freq: Every day | ORAL | 6 refills | Status: DC

## 2019-05-28 MED ORDER — METFORMIN HCL ER 500 MG PO TB24: 500.0000 mg | ORAL_TABLET | Freq: Every day | ORAL | 6 refills | Status: DC

## 2019-05-28 MED FILL — SPIRIVA 18 MCG CP-HANDIHALE: 18 | 30 days supply | Qty: 30 | Fill #0

## 2019-05-28 MED FILL — FLUTICASONE-SALMETEROL 250-: 250-50 | 30 days supply | Qty: 60 | Fill #0

## 2019-05-28 MED FILL — ATORVASTATIN 80 MG TABLET: 80 | 90 days supply | Qty: 90 | Fill #0

## 2019-05-28 MED FILL — metFORMIN HCL ER 500 MG TB2: 500 | 90 days supply | Qty: 90 | Fill #0

## 2019-05-28 NOTE — Progress Notes (Signed)
Subjective:  Patient ID: David Barber, male    DOB: 08-Jul-1950  Age: 69 y.o. MRN: [id number]  CC: Diabetes   HPI David Barber is a 69 year old male with a history of COPD, type 2 diabetes mellitus (A1c 5.9), hyperlipidemia COPD who presents today forafollow-up   He does not check his blood sugars at home but denies presence of hypoglycemia, numbness in extremities or visual concerns.  I had referred him to ophthalmology however review of his chart indicates patient declined appointment. Compliant with his statin. He has had a rash on his abdomen and legs for >1 year which  pruritic and he will be seeing Dermatology tomorrow.  He has no known allergies and denies history of introduction of new foods or skin products. He also requires clearance for tooth extraction.  His COPD is stable with no recent exacerbation. He has no additional concerns today.  Past Medical History:  Diagnosis Date  . Bronchitis   . COPD (chronic obstructive pulmonary disease) ([place])   . Diabetes mellitus without complication ([place])   . GERD (gastroesophageal reflux disease)   . Hypertension     Past Surgical History:  Procedure Laterality Date  . CYST REMOVAL NECK  2013   benign    Family History  Problem Relation Age of Onset  . Colon cancer Neg Hx     No Known Allergies  Outpatient Medications Prior to Visit  Medication Sig Dispense Refill  . albuterol (PROVENTIL HFA;VENTOLIN HFA) 108 (90 Base) MCG/ACT inhaler Inhale 2 puffs into the lungs every 6 (six) hours as needed for wheezing or shortness of breath. 1 Inhaler 6  . aspirin EC 81 MG tablet Take 1 tablet (81 mg total) by mouth daily. 30 tablet 5  . atorvastatin (LIPITOR) 80 MG tablet Take 1 tablet (80 mg total) by mouth daily. 30 tablet 6  . buPROPion (WELLBUTRIN SR) 150 MG 12 hr tablet Take 1 tablet (150 mg total) by mouth 2 (two) times daily. 60 tablet 3  . cetirizine (ZYRTEC) 10 MG tablet Take 1 tablet (10 mg total) by mouth  daily. 30 tablet 11  . diphenhydrAMINE (BENADRYL) 25 MG tablet Take 1 tablet (25 mg total) by mouth every 6 (six) hours as needed. 30 tablet 0  . ergocalciferol (DRISDOL) 50000 units capsule Take 1 capsule (50,000 Units total) by mouth once a week. 9 capsule 1  . Fluticasone-Salmeterol (ADVAIR DISKUS) 250-50 MCG/DOSE AEPB Inhale 1 puff into the lungs 2 (two) times daily. 1 each 6  . gabapentin (NEURONTIN) 300 MG capsule Take 1 capsule (300 mg total) by mouth at bedtime. 30 capsule 6  . hydrOXYzine (ATARAX/VISTARIL) 25 MG tablet Take 1 tablet (25 mg total) by mouth every 8 (eight) hours as needed for itching. 90 tablet 0  . ketotifen (ZADITOR) 0.025 % ophthalmic solution Place 1 drop into both eyes 2 (two) times daily. 5 mL 1  . loperamide (IMODIUM) 2 MG capsule Take 1 capsule (2 mg total) by mouth 4 (four) times daily as needed for diarrhea or loose stools. 12 capsule 0  . metFORMIN (GLUCOPHAGE XR) 500 MG 24 hr tablet Take 1 tablet (500 mg total) by mouth 2 (two) times daily. 60 tablet 6  . methocarbamol (ROBAXIN) 500 MG tablet Take 1 tablet (500 mg total) by mouth 2 (two) times daily as needed for muscle spasms. 60 tablet 1  . permethrin (ELIMITE) 5 % cream Apply 1 application topically once.    . predniSONE (DELTASONE) 20 MG tablet Day 1-5:  Take 2 tablets daily. Day 6-10: Take 1 tablet daily. Take tablets with breakfast. 15 tablet 0  . tiotropium (SPIRIVA HANDIHALER) 18 MCG inhalation capsule Place 1 capsule (18 mcg total) into inhaler and inhale daily. 30 capsule 6  . triamcinolone cream (KENALOG) 0.1 % Apply 1 application topically 2 (two) times daily. 45 g 1   No facility-administered medications prior to visit.     ROS Review of Systems  Constitutional: Negative for activity change and appetite change.  HENT: Negative for sinus pressure and sore throat.   Eyes: Negative for visual disturbance.  Respiratory: Negative for cough, chest tightness and shortness of breath.   Cardiovascular:  Negative for chest pain and leg swelling.  Gastrointestinal: Negative for abdominal distention, abdominal pain, constipation and diarrhea.  Endocrine: Negative.   Genitourinary: Negative for dysuria.  Musculoskeletal: Negative for joint swelling and myalgias.  Skin: Positive for rash.  Allergic/Immunologic: Negative.   Neurological: Negative for weakness, light-headedness and numbness.  Psychiatric/Behavioral: Negative for dysphoric mood and suicidal ideas.    Objective:  BP 103/66   Pulse 100   Ht '5\' 5"'  (1.651 m)   Wt 169 lb 3.2 oz (76.7 kg)   SpO2 93%   BMI 28.16 kg/m   BP/Weight 05/28/2019 04/05/2019 36/08/7701  Systolic BP 403 524 818  Diastolic BP 66 81 82  Wt. (Lbs) 169.2 - -  BMI 28.16 - -      Physical Exam Constitutional:      Appearance: He is well-developed.  Neck:     Vascular: No JVD.  Cardiovascular:     Rate and Rhythm: Normal rate.     Heart sounds: Normal heart sounds. No murmur.  Pulmonary:     Effort: Pulmonary effort is normal.     Breath sounds: Normal breath sounds. No wheezing or rales.  Chest:     Chest wall: No tenderness.  Abdominal:     General: Bowel sounds are normal. There is no distension.     Palpations: Abdomen is soft. There is no mass.     Tenderness: There is no abdominal tenderness.  Musculoskeletal:        General: Normal range of motion.     Right lower leg: No edema.     Left lower leg: No edema.  Skin:    Comments: Multiple large Barber on left upper thigh and right side of abdomen with rough surface and scratch marks.  Neurological:     Mental Status: He is alert and oriented to person, place, and time.  Psychiatric:        Mood and Affect: Mood normal.     CMP Latest Ref Rng & Units 01/27/2019 01/26/2019 01/25/2019  Glucose 70 - 99 mg/dL 118(H) 104(H) 115(H)  BUN 8 - 23 mg/dL 28(H) 27(H) 27(H)  Creatinine 0.61 - 1.24 mg/dL 0.83 0.74 0.77  Sodium 135 - 145 mmol/L 139 139 142  Potassium 3.5 - 5.1 mmol/L 4.6 4.4 4.6    Chloride 98 - 111 mmol/L 103 103 107  CO2 22 - 32 mmol/L '24 24 25  ' Calcium 8.9 - 10.3 mg/dL 9.2 8.8(L) 9.0  Total Protein 6.5 - 8.1 g/dL 6.8 6.4(L) 6.4(L)  Total Bilirubin 0.3 - 1.2 mg/dL 0.8 0.6 0.9  Alkaline Phos 38 - 126 U/L 78 78 78  AST 15 - 41 U/L 45(H) 52(H) 42(H)  ALT 0 - 44 U/L 71(H) 73(H) 56(H)    Lipid Panel     Component Value Date/Time   CHOL 166 05/02/2018 1028  TRIG 161 (H) 01/22/2019 1200   HDL 45 05/02/2018 1028   CHOLHDL 3.7 05/02/2018 1028   CHOLHDL 6.5 (H) 07/21/2015 1625   VLDL 61 (H) 07/21/2015 1625   LDLCALC 100 (H) 05/02/2018 1028    CBC    Component Value Date/Time   WBC 8.0 01/23/2019 0140   RBC 5.28 01/23/2019 0140   HGB 14.3 01/23/2019 0140   HCT 44.6 01/23/2019 0140   PLT 149 (L) 01/23/2019 0140   MCV 84.5 01/23/2019 0140   MCH 27.1 01/23/2019 0140   MCHC 32.1 01/23/2019 0140   RDW 15.4 01/23/2019 0140   LYMPHSABS 0.9 01/23/2019 0140   MONOABS 0.2 01/23/2019 0140   EOSABS 0.0 01/23/2019 0140   BASOSABS 0.0 01/23/2019 0140    Lab Results  Component Value Date   HGBA1C 5.9 05/28/2019     Assessment & Plan:   1. Controlled type 2 diabetes mellitus without complication, without long-term current use of insulin (HCC) Controlled with last A1c at 5.9 Reduce Metformin to extended release 1 tablet daily Provided information for ophthalmologist office so he can schedule an appointment Counseled on Diabetic diet, my plate method, 446 minutes of moderate intensity exercise/week Blood sugar logs with fasting goals of 80-120 mg/dl, random of less than 180 and in the event of sugars less than 60 mg/dl or greater than 400 mg/dl encouraged to notify the clinic. Advised on the need for annual eye exams, annual foot exams, Pneumonia vaccine. - POCT glucose (manual entry) - POCT glycosylated hemoglobin (Hb A1C) - Microalbumin / creatinine urine ratio - CMP14+EGFR; Future - Lipid panel; Future - metFORMIN (GLUCOPHAGE XR) 500 MG 24 hr tablet; Take  1 tablet (500 mg total) by mouth daily with breakfast.  Dispense: 30 tablet; Refill: 6  2. Hyperlipidemia associated with type 2 diabetes mellitus ([place]) Controlled He is due for lipid panel and will return when he is fasting to have this done in 2 days Counseled on low-cholesterol diet - atorvastatin (LIPITOR) 80 MG tablet; Take 1 tablet (80 mg total) by mouth daily.  Dispense: 30 tablet; Refill: 6  3. COPD, mild (HCC) Stable No acute exacerbation Continue current regimen - Fluticasone-Salmeterol (ADVAIR DISKUS) 250-50 MCG/DOSE AEPB; Inhale 1 puff into the lungs 2 (two) times daily.  Dispense: 1 each; Refill: 6 - tiotropium (SPIRIVA HANDIHALER) 18 MCG inhalation capsule; Place 1 capsule (18 mcg total) into inhaler and inhale daily.  Dispense: 30 capsule; Refill: 6  4. Dermatitis Extensive dermatitis of unknown etiology Keep appointment with dermatology tomorrow   5.  Preoperative clearance Provided clearance letter for dental procedure  Return in about 6 months (around 11/28/2019) for medical conditions.     Charlott Rakes, MD, FAAFP. [facility] [place], [place]   05/28/2019, 9:29 AM

## 2019-05-28 NOTE — Progress Notes (Signed)
Patient has a rash on legs.

## 2019-05-28 NOTE — Patient Instructions (Addendum)
[place] ph # [phone] address [address] - please call for your Ophthalmology appointment.

## 2019-05-30 ENCOUNTER — Other Ambulatory Visit: Payer: Self-pay

## 2019-05-30 ENCOUNTER — Ambulatory Visit: Payer: Medicare Other | Attending: Family Medicine

## 2019-05-30 DIAGNOSIS — E119 Type 2 diabetes mellitus without complications: Secondary | ICD-10-CM

## 2019-05-31 ENCOUNTER — Telehealth: Payer: Self-pay

## 2019-05-31 LAB — LIPID PANEL
Chol/HDL Ratio: 6.2 ratio — ABNORMAL HIGH (ref 0.0–5.0)
Cholesterol, Total: 254 mg/dL — ABNORMAL HIGH (ref 100–199)
HDL: 41 mg/dL (ref >39)
LDL Chol Calc (NIH): 176 mg/dL — ABNORMAL HIGH (ref 0–99)
Triglycerides: 195 mg/dL — ABNORMAL HIGH (ref 0–149)
VLDL Cholesterol Cal: 37 mg/dL (ref 5–40)

## 2019-05-31 LAB — CMP14+EGFR
ALT: 16 IU/L (ref 0–44)
AST: 19 IU/L (ref 0–40)
Albumin/Globulin Ratio: 1.4 (ref 1.2–2.2)
Albumin: 4.1 g/dL (ref 3.8–4.8)
Alkaline Phosphatase: 111 IU/L (ref 39–117)
BUN/Creatinine Ratio: 10 (ref 10–24)
BUN: 8 mg/dL (ref 8–27)
Bilirubin Total: 0.3 mg/dL (ref 0.0–1.2)
CO2: 22 mmol/L (ref 20–29)
Calcium: 9.6 mg/dL (ref 8.6–10.2)
Chloride: 104 mmol/L (ref 96–106)
Creatinine, Ser: 0.82 mg/dL (ref 0.76–1.27)
GFR calc Af Amer: 104 mL/min/{1.73_m2} (ref >59)
GFR calc non Af Amer: 90 mL/min/{1.73_m2} (ref >59)
Globulin, Total: 3 g/dL (ref 1.5–4.5)
Glucose: 98 mg/dL (ref 65–99)
Potassium: 4.5 mmol/L (ref 3.5–5.2)
Sodium: 142 mmol/L (ref 134–144)
Total Protein: 7.1 g/dL (ref 6.0–8.5)

## 2019-05-31 NOTE — Telephone Encounter (Signed)
-----   Message from Charlott Rakes, MD sent at 05/31/2019  2:21 PM EST ----- Cholesterol is significantly elevated.  Can you please find out if he has been compliant with his Lipitor as he needs to do so and comply with a low-cholesterol diet

## 2019-05-31 NOTE — Telephone Encounter (Signed)
Patient was called and a voicemail was left informing patient to return phone call for lab results. 

## 2019-06-26 LAB — HM DIABETES EYE EXAM

## 2019-07-10 MED FILL — SPIRIVA 18 MCG CP-HANDIHALE: 18 | 30 days supply | Qty: 30 | Fill #1

## 2019-07-10 MED FILL — ADVAIR 250/50 DISKUS: 250-50 | 30 days supply | Qty: 60 | Fill #1

## 2019-08-09 MED FILL — SPIRIVA 18 MCG CP-HANDIHALE: 18 | 30 days supply | Qty: 30 | Fill #2

## 2019-08-09 MED FILL — ADVAIR 250/50 DISKUS: 250-50 | 30 days supply | Qty: 60 | Fill #2

## 2019-09-11 MED FILL — SPIRIVA 18 MCG CP-HANDIHALE: 18 | 30 days supply | Qty: 30 | Fill #3

## 2019-09-11 MED FILL — ADVAIR 250/50 DISKUS: 250-50 | 30 days supply | Qty: 60 | Fill #3

## 2019-10-18 MED FILL — SPIRIVA 18 MCG CP-HANDIHALE: 18 | 30 days supply | Qty: 30 | Fill #4

## 2019-10-18 MED FILL — ADVAIR 250/50 DISKUS: 250-50 | 30 days supply | Qty: 60 | Fill #4

## 2019-11-08 ENCOUNTER — Ambulatory Visit (HOSPITAL_COMMUNITY)
Admission: EM | Admit: 2019-11-08 | Discharge: 2019-11-08 | Disposition: A | Discharge status: Home / Self Care | Payer: Medicare Other | Attending: Family Medicine | Admitting: Family Medicine

## 2019-11-08 ENCOUNTER — Ambulatory Visit (INDEPENDENT_AMBULATORY_CARE_PROVIDER_SITE_OTHER): Payer: Medicare Other

## 2019-11-08 ENCOUNTER — Encounter (HOSPITAL_COMMUNITY): Payer: Self-pay

## 2019-11-08 ENCOUNTER — Other Ambulatory Visit: Payer: Self-pay

## 2019-11-08 DIAGNOSIS — R509 Fever, unspecified: Secondary | ICD-10-CM

## 2019-11-08 DIAGNOSIS — R05 Cough: Secondary | ICD-10-CM

## 2019-11-08 DIAGNOSIS — E785 Hyperlipidemia, unspecified: Secondary | ICD-10-CM | POA: Insufficient documentation

## 2019-11-08 DIAGNOSIS — Z87891 Personal history of nicotine dependence: Secondary | ICD-10-CM | POA: Diagnosis not present

## 2019-11-08 DIAGNOSIS — J449 Chronic obstructive pulmonary disease, unspecified: Secondary | ICD-10-CM | POA: Insufficient documentation

## 2019-11-08 DIAGNOSIS — Z7982 Long term (current) use of aspirin: Secondary | ICD-10-CM | POA: Diagnosis not present

## 2019-11-08 DIAGNOSIS — I10 Essential (primary) hypertension: Secondary | ICD-10-CM | POA: Diagnosis not present

## 2019-11-08 DIAGNOSIS — E119 Type 2 diabetes mellitus without complications: Secondary | ICD-10-CM | POA: Diagnosis not present

## 2019-11-08 DIAGNOSIS — Z8616 Personal history of COVID-19: Secondary | ICD-10-CM | POA: Diagnosis not present

## 2019-11-08 DIAGNOSIS — K5900 Constipation, unspecified: Secondary | ICD-10-CM | POA: Diagnosis not present

## 2019-11-08 DIAGNOSIS — Z7984 Long term (current) use of oral hypoglycemic drugs: Secondary | ICD-10-CM | POA: Diagnosis not present

## 2019-11-08 DIAGNOSIS — Z79899 Other long term (current) drug therapy: Secondary | ICD-10-CM | POA: Diagnosis not present

## 2019-11-08 DIAGNOSIS — R059 Cough, unspecified: Secondary | ICD-10-CM

## 2019-11-08 DIAGNOSIS — Z20822 Contact with and (suspected) exposure to covid-19: Secondary | ICD-10-CM | POA: Diagnosis not present

## 2019-11-08 LAB — SARS CORONAVIRUS 2 (TAT 6-24 HRS): SARS Coronavirus 2: NEGATIVE

## 2019-11-08 MED ORDER — BENZONATATE 200 MG PO CAPS: 200.0000 mg | ORAL_CAPSULE | Freq: Three times a day (TID) | ORAL | 0 refills | Status: AC | PRN

## 2019-11-08 MED ORDER — POLYETHYLENE GLYCOL 3350 17 G PO PACK: 17.0000 g | PACK | Freq: Every day | ORAL | 0 refills | Status: DC

## 2019-11-08 MED ORDER — DOCUSATE SODIUM 100 MG PO CAPS: 100.0000 mg | ORAL_CAPSULE | Freq: Two times a day (BID) | ORAL | 0 refills | Status: DC

## 2019-11-08 NOTE — Discharge Instructions (Signed)
COVID test pending, monitor mychart for results Tessalon/benzonatate as needed for cough every 8 hours Tylenol and ibuprofen for fever/chilss  Please use Miralax for moderate to severe constipation. Take this once a day for the next 2-3 days. Please also start docusate stool softener, twice a day for at least 1 week. If stools become loose, cut down to once a day for another week. If stools remain loose, cut back to 1 pill every other day for a third week. You can stop docusate thereafter and resume as needed for constipation.  To help reduce constipation and promote bowel health: 1. Drink at least 64 ounces of water each day 2. Eat plenty of fiber (fruits, vegetables, whole grains, legumes) 3. Be physically active or exercise including walking, jogging, swimming, yoga, etc. 4. For active constipation use a stool softener (docusate) or an osmotic laxative (like Miralax) each day, or as needed.

## 2019-11-08 NOTE — ED Provider Notes (Signed)
[place]    CSN: [id number] Arrival date & time: 11/08/19  0263      History   Chief Complaint Chief Complaint  Patient presents with  . Chills  . Cough    HPI David Barber is a 69 y.o. male history of DM type II, hypertension, COPD, respiratory failure due to Covid pneumonia in 01/2019, presenting today for evaluation of fever chills and cough.  Patient reports 2 days ago he began to feel subjective fevers along with chills.  Symptoms improved yesterday, but returned again today.  Has reports a mild cough.  Denies any chest pain or shortness of breath.  Was admitted for Covid in November 2020, but he denies any difficulty breathing or problems since.  Does report some constipation and occasional abdominal pain, denies diarrhea nausea or vomiting.  HPI  Past Medical History:  Diagnosis Date  . Bronchitis   . COPD (chronic obstructive pulmonary disease) ([place])   . Diabetes mellitus without complication ([place])   . GERD (gastroesophageal reflux disease)   . Hypertension     Patient Active Problem List   Diagnosis Date Noted  . Pneumonia due to severe acute respiratory syndrome coronavirus 2 (SARS-CoV-2) 01/22/2019  . Diabetic neuropathy ([place]) 01/22/2018  . Hyperlipidemia associated with type 2 diabetes mellitus ([place]) 07/29/2015  . Hearing loss 07/29/2015  . Vitamin D deficiency 05/15/2015  . Chronic low back pain 02/11/2015  . COPD, mild ([place]) 11/17/2014  . Chewing tobacco use 11/17/2014  . Chest pain 06/18/2014  . GERD (gastroesophageal reflux disease) 06/18/2014  . Nonspecific abnormal electrocardiogram (ECG) (EKG) 06/16/2014  . Diabetes type 2, controlled ([place]) 12/21/2012    Past Surgical History:  Procedure Laterality Date  . CYST REMOVAL NECK  2013   benign       Home Medications    Prior to Admission medications   Medication Sig Start Date End Date Taking? Authorizing Provider  albuterol (PROVENTIL HFA;VENTOLIN HFA) 108 (90 Base)  MCG/ACT inhaler Inhale 2 puffs into the lungs every 6 (six) hours as needed for wheezing or shortness of breath. 04/30/18   Charlott Rakes, MD  aspirin EC 81 MG tablet Take 1 tablet (81 mg total) by mouth daily. 11/15/17   Argentina Donovan, PA-C  atorvastatin (LIPITOR) 80 MG tablet Take 1 tablet (80 mg total) by mouth daily. 05/28/19   Charlott Rakes, MD  benzonatate (TESSALON) 200 MG capsule Take 1 capsule (200 mg total) by mouth 3 (three) times daily as needed for up to 7 days for cough. 11/08/19 11/15/19  Karra Pink C, PA-C  buPROPion (WELLBUTRIN SR) 150 MG 12 hr tablet Take 1 tablet (150 mg total) by mouth 2 (two) times daily. 05/22/18   Charlott Rakes, MD  cetirizine (ZYRTEC) 10 MG tablet Take 1 tablet (10 mg total) by mouth daily. 08/16/18   Argentina Donovan, PA-C  diphenhydrAMINE (BENADRYL) 25 MG tablet Take 1 tablet (25 mg total) by mouth every 6 (six) hours as needed. 12/29/18   Loura Halt A, NP  docusate sodium (COLACE) 100 MG capsule Take 1 capsule (100 mg total) by mouth 2 (two) times daily. 11/08/19   Jorah Hua C, PA-C  Fluticasone-Salmeterol (ADVAIR DISKUS) 250-50 MCG/DOSE AEPB Inhale 1 puff into the lungs 2 (two) times daily. 05/28/19   Charlott Rakes, MD  gabapentin (NEURONTIN) 300 MG capsule Take 1 capsule (300 mg total) by mouth at bedtime. 12/03/18   Charlott Rakes, MD  hydrOXYzine (ATARAX/VISTARIL) 25 MG tablet Take 1 tablet (25 mg total)  by mouth every 8 (eight) hours as needed for itching. 04/05/19   Jaynee Eagles, PA-C  ketotifen (ZADITOR) 0.025 % ophthalmic solution Place 1 drop into both eyes 2 (two) times daily. 02/15/17   Argentina Donovan, PA-C  loperamide (IMODIUM) 2 MG capsule Take 1 capsule (2 mg total) by mouth 4 (four) times daily as needed for diarrhea or loose stools. 08/18/18   Varney Biles, MD  metFORMIN (GLUCOPHAGE XR) 500 MG 24 hr tablet Take 1 tablet (500 mg total) by mouth daily with breakfast. 05/28/19   Charlott Rakes, MD  methocarbamol (ROBAXIN) 500 MG  tablet Take 1 tablet (500 mg total) by mouth 2 (two) times daily as needed for muscle spasms. 01/22/18   Charlott Rakes, MD  permethrin (ELIMITE) 5 % cream Apply 1 application topically once.    [provider]  polyethylene glycol (MIRALAX / GLYCOLAX) 17 g packet Take 17 g by mouth daily. 11/08/19   [place] Retana C, PA-C  predniSONE (DELTASONE) 20 MG tablet Day 1-5: Take 2 tablets daily. Day 6-10: Take 1 tablet daily. Take tablets with breakfast. 04/05/19   Jaynee Eagles, PA-C  tiotropium (SPIRIVA HANDIHALER) 18 MCG inhalation capsule Place 1 capsule (18 mcg total) into inhaler and inhale daily. 05/28/19   Charlott Rakes, MD  triamcinolone cream (KENALOG) 0.1 % Apply 1 application topically 2 (two) times daily. 04/30/18   Charlott Rakes, MD    Family History Family History  Problem Relation Age of Onset  . Colon cancer Neg Hx     Social History Social History   Tobacco Use  . Smoking status: Former Smoker    Types: Cigarettes    Quit date: 10/19/2013    Years since quitting: 6.0  . Smokeless tobacco: Current User  . Tobacco comment: chewing tobacco  Vaping Use  . Vaping Use: Never used  Substance Use Topics  . Alcohol use: Yes    Alcohol/week: 3.0 standard drinks    Types: 3 Cans of beer per week  . Drug use: No     Allergies   Patient has no known allergies.   Review of Systems Review of Systems  Constitutional: Positive for chills and fever. Negative for activity change, appetite change and fatigue.  HENT: Negative for congestion, ear pain, rhinorrhea, sinus pressure, sore throat and trouble swallowing.   Eyes: Negative for discharge and redness.  Respiratory: Positive for cough. Negative for chest tightness and shortness of breath.   Cardiovascular: Negative for chest pain.  Gastrointestinal: Negative for abdominal pain, diarrhea, nausea and vomiting.  Musculoskeletal: Negative for myalgias.  Skin: Negative for rash.  Neurological: Negative for dizziness,  light-headedness and headaches.     Physical Exam Triage Vital Signs ED Triage Vitals  Enc Vitals Group     BP 11/08/19 1111 118/74     Pulse Rate 11/08/19 1111 (!) 104     Resp 11/08/19 1111 18     Temp 11/08/19 1111 98.6 F (37 C)     Temp Source 11/08/19 1111 Oral     SpO2 11/08/19 1111 92 %     Weight --      Height --      Head Circumference --      Peak Flow --      Pain Score 11/08/19 1117 1     Pain Loc --      Pain Edu? --      Excl. in [place]? --    No data found.  Updated Vital Signs BP 118/74 (BP  Location: Left Arm)   Pulse (!) 104   Temp 98.6 F (37 C) (Oral)   Resp 18   SpO2 92%   Visual Acuity Right Eye Distance:   Left Eye Distance:   Bilateral Distance:    Right Eye Near:   Left Eye Near:    Bilateral Near:     Physical Exam Vitals and nursing note reviewed.  Constitutional:      Appearance: He is well-developed.     Comments: No acute distress  HENT:     Head: Normocephalic and atraumatic.     Ears:     Comments: Bilateral ears without tenderness to palpation of external auricle, tragus and mastoid, EAC's without erythema or swelling, TM's with good bony landmarks and cone of light. Non erythematous.     Nose: Nose normal.  Eyes:     Conjunctiva/sclera: Conjunctivae normal.  Cardiovascular:     Rate and Rhythm: Normal rate.  Pulmonary:     Effort: Pulmonary effort is normal. No respiratory distress.     Comments: Breathing comfortably at rest, CTABL, no wheezing, rales or other adventitious sounds auscultated Occasional coarse cough in room Abdominal:     General: There is no distension.  Musculoskeletal:        General: Normal range of motion.     Cervical back: Neck supple.  Skin:    General: Skin is warm and dry.  Neurological:     Mental Status: He is alert and oriented to person, place, and time.      UC Treatments / Results  Labs (all labs ordered are listed, but only abnormal results are displayed) Labs Reviewed  SARS  CORONAVIRUS 2 (TAT 6-24 HRS)    EKG   Radiology DG Chest 2 View  Result Date: 11/08/2019 CLINICAL DATA:  Fever cough chills. EXAM: CHEST - 2 VIEW COMPARISON:  01/22/2019 FINDINGS: COPD with hyperinflation. Mild scarring in the bases left greater than right. Patchy density in the right mid lung unchanged compatible with scarring. Apical scarring bilaterally. Interval resolution of left lower lobe infiltrate on the prior study. Heart size and vascularity normal.  Negative for pleural effusion. IMPRESSION: COPD with scarring.  No acute abnormality. Electronically Signed   By: Franchot Gallo M.D.   On: 11/08/2019 11:52    Procedures Procedures (including critical care time)  Medications Ordered in UC Medications - No data to display  Initial Impression / Assessment and Plan / UC Course  I have reviewed the triage vital signs and the nursing notes.  Pertinent labs & imaging results that were available during my care of the patient were reviewed by me and considered in my medical decision making (see chart for details).     Oxygen ranging from 92-95%, heart rate ranging from 100-110  Chest x-ray negative for pneumonia, stable COPD and scarring.  Covid testing pending.  Will treating symptomatically and supportively at this time.  Rest and fluids.  Close monitoring.  For constipation recommending Colace and MiraLAX along with lifestyle modifications.  Discussed strict return precautions. Patient verbalized understanding and is agreeable with plan.  Final Clinical Impressions(s) / UC Diagnoses   Final diagnoses:  Cough  Fever, unspecified  Constipation, unspecified constipation type     Discharge Instructions     COVID test pending, monitor mychart for results Tessalon/benzonatate as needed for cough every 8 hours Tylenol and ibuprofen for fever/chilss  Please use Miralax for moderate to severe constipation. Take this once a day for the next 2-3 days. Please  also start docusate  stool softener, twice a day for at least 1 week. If stools become loose, cut down to once a day for another week. If stools remain loose, cut back to 1 pill every other day for a third week. You can stop docusate thereafter and resume as needed for constipation.  To help reduce constipation and promote bowel health: 1. Drink at least 64 ounces of water each day 2. Eat plenty of fiber (fruits, vegetables, whole grains, legumes) 3. Be physically active or exercise including walking, jogging, swimming, yoga, etc. 4. For active constipation use a stool softener (docusate) or an osmotic laxative (like Miralax) each day, or as needed.    ED Prescriptions    Medication Sig Dispense Auth. Provider   benzonatate (TESSALON) 200 MG capsule Take 1 capsule (200 mg total) by mouth 3 (three) times daily as needed for up to 7 days for cough. 28 capsule Anihya Tuma C, PA-C   polyethylene glycol (MIRALAX / GLYCOLAX) 17 g packet Take 17 g by mouth daily. 14 each Ilithyia Titzer C, PA-C   docusate sodium (COLACE) 100 MG capsule Take 1 capsule (100 mg total) by mouth 2 (two) times daily. 60 capsule Makylah Bossard, [place] C, PA-C     PDMP not reviewed this encounter.   Rannie Craney, [place] C, PA-C 11/08/19 1223

## 2019-11-08 NOTE — ED Triage Notes (Signed)
Pt presents with slight non productive cough and chills X 3 days.

## 2019-11-18 MED FILL — ATORVASTATIN 80 MG TABLET: 80 | 30 days supply | Qty: 30 | Fill #2

## 2019-11-18 MED FILL — SPIRIVA 18 MCG CP-HANDIHALE: 18 | 30 days supply | Qty: 30 | Fill #5

## 2019-11-18 MED FILL — ADVAIR 250/50 DISKUS: 250-50 | 30 days supply | Qty: 60 | Fill #5

## 2019-11-18 MED FILL — metFORMIN HCL ER 500 MG TB2: 500 | 30 days supply | Qty: 30 | Fill #2

## 2019-11-28 ENCOUNTER — Other Ambulatory Visit: Payer: Self-pay | Admitting: Family Medicine

## 2019-11-28 ENCOUNTER — Encounter: Payer: Self-pay | Admitting: Family Medicine

## 2019-11-28 ENCOUNTER — Ambulatory Visit: Payer: Medicare Other | Attending: Family Medicine | Admitting: Family Medicine

## 2019-11-28 ENCOUNTER — Other Ambulatory Visit: Payer: Self-pay

## 2019-11-28 VITALS — BP 93/63 | HR 90 | Ht 65.0 in | Wt 166.0 lb

## 2019-11-28 DIAGNOSIS — K219 Gastro-esophageal reflux disease without esophagitis: Secondary | ICD-10-CM | POA: Diagnosis not present

## 2019-11-28 DIAGNOSIS — E1169 Type 2 diabetes mellitus with other specified complication: Secondary | ICD-10-CM | POA: Diagnosis present

## 2019-11-28 DIAGNOSIS — E785 Hyperlipidemia, unspecified: Secondary | ICD-10-CM | POA: Insufficient documentation

## 2019-11-28 DIAGNOSIS — Z7984 Long term (current) use of oral hypoglycemic drugs: Secondary | ICD-10-CM | POA: Diagnosis not present

## 2019-11-28 DIAGNOSIS — Z7951 Long term (current) use of inhaled steroids: Secondary | ICD-10-CM | POA: Diagnosis not present

## 2019-11-28 DIAGNOSIS — L299 Pruritus, unspecified: Secondary | ICD-10-CM | POA: Diagnosis not present

## 2019-11-28 DIAGNOSIS — J449 Chronic obstructive pulmonary disease, unspecified: Secondary | ICD-10-CM | POA: Diagnosis not present

## 2019-11-28 DIAGNOSIS — I10 Essential (primary) hypertension: Secondary | ICD-10-CM | POA: Insufficient documentation

## 2019-11-28 DIAGNOSIS — F17290 Nicotine dependence, other tobacco product, uncomplicated: Secondary | ICD-10-CM | POA: Insufficient documentation

## 2019-11-28 DIAGNOSIS — E1149 Type 2 diabetes mellitus with other diabetic neurological complication: Secondary | ICD-10-CM

## 2019-11-28 DIAGNOSIS — Z79899 Other long term (current) drug therapy: Secondary | ICD-10-CM | POA: Diagnosis not present

## 2019-11-28 DIAGNOSIS — Z7982 Long term (current) use of aspirin: Secondary | ICD-10-CM | POA: Diagnosis not present

## 2019-11-28 DIAGNOSIS — E119 Type 2 diabetes mellitus without complications: Secondary | ICD-10-CM | POA: Diagnosis not present

## 2019-11-28 DIAGNOSIS — Z23 Encounter for immunization: Secondary | ICD-10-CM

## 2019-11-28 DIAGNOSIS — Z720 Tobacco use: Secondary | ICD-10-CM | POA: Diagnosis not present

## 2019-11-28 LAB — POCT GLYCOSYLATED HEMOGLOBIN (HGB A1C): HbA1c, POC (controlled diabetic range): 6.5 % (ref 0.0–7.0)

## 2019-11-28 LAB — GLUCOSE, POCT (MANUAL RESULT ENTRY): POC Glucose: 99 mg/dl (ref 70–99)

## 2019-11-28 MED ORDER — ALBUTEROL SULFATE HFA 108 (90 BASE) MCG/ACT IN AERS: 2.0000 | INHALATION_SPRAY | Freq: Four times a day (QID) | RESPIRATORY_TRACT | 6 refills | Status: DC | PRN

## 2019-11-28 MED ORDER — FLUTICASONE-SALMETEROL 250-50 MCG/DOSE IN AEPB: 1.0000 | INHALATION_SPRAY | Freq: Two times a day (BID) | RESPIRATORY_TRACT | 6 refills | Status: DC

## 2019-11-28 MED ORDER — METFORMIN HCL ER 500 MG PO TB24: 500.0000 mg | ORAL_TABLET | Freq: Every day | ORAL | 6 refills | Status: DC

## 2019-11-28 MED ORDER — BUPROPION HCL ER (SR) 150 MG PO TB12: 150.0000 mg | ORAL_TABLET | Freq: Two times a day (BID) | ORAL | 3 refills | Status: DC

## 2019-11-28 MED ORDER — ATORVASTATIN CALCIUM 80 MG PO TABS: 80.0000 mg | ORAL_TABLET | Freq: Every day | ORAL | 6 refills | Status: DC

## 2019-11-28 MED ORDER — GABAPENTIN 300 MG PO CAPS: 300.0000 mg | ORAL_CAPSULE | Freq: Every day | ORAL | 6 refills | Status: DC

## 2019-11-28 MED ORDER — HYDROCORTISONE 1 % EX CREA: 1.0000 "application " | TOPICAL_CREAM | Freq: Two times a day (BID) | CUTANEOUS | 1 refills | Status: DC

## 2019-11-28 MED ORDER — SPIRIVA HANDIHALER 18 MCG IN CAPS: 18.0000 ug | ORAL_CAPSULE | Freq: Every day | RESPIRATORY_TRACT | 6 refills | Status: DC

## 2019-11-28 MED FILL — GABAPENTIN 300 MG CAPSULE: 300 | 30 days supply | Qty: 30 | Fill #0

## 2019-11-28 MED FILL — VENTOLIN HFA 90 MCG INHALER: 108 (90 BAS | 25 days supply | Qty: 18 | Fill #0

## 2019-11-28 MED FILL — BUPROPION SR 150 MG TABLET: 150 | 30 days supply | Qty: 60 | Fill #0

## 2019-11-28 NOTE — Progress Notes (Signed)
Subjective:  Patient ID: David Barber, male    DOB: [birth year]  Age: 69 y.o. MRN: [id number]  CC: Diabetes   HPI David Barber is a 69 year old male with a history of COPD, type 2 diabetes mellitus (A1c 6.5), hyperlipidemia COPD who presents today forafollow-up He still has the cough for which he was seen at the ED on 11/08/19 but states it has improved.  Continues to chew tobacco on a daily basis.  Chest x-ray at ED visit had revealed COPD with scaring.  He was discharged on Tessalon [place] and MiraLAX was provided for constipation. His COPD is stable with no exacerbation.   He sometimes has itching in his legs which he states is chronic and has been on for several years but denies the presence of rash.  With regards to his diabetes mellitus he denies presence of hypoglycemia, blurry vision or numbness in extremities.  Doing well on Metformin with no adverse effects from his medication. He is also on a statin and denies the presence of myalgias. He has no additional complaints today.  Past Medical History:  Diagnosis Date  . Bronchitis   . COPD (chronic obstructive pulmonary disease) ([place])   . Diabetes mellitus without complication ([place])   . GERD (gastroesophageal reflux disease)   . Hypertension     Past Surgical History:  Procedure Laterality Date  . CYST REMOVAL NECK  2013   benign    Family History  Problem Relation Age of Onset  . Colon cancer Neg Hx     No Known Allergies  Outpatient Medications Prior to Visit  Medication Sig Dispense Refill  . aspirin EC 81 MG tablet Take 1 tablet (81 mg total) by mouth daily. 30 tablet 5  . cetirizine (ZYRTEC) 10 MG tablet Take 1 tablet (10 mg total) by mouth daily. 30 tablet 11  . diphenhydrAMINE (BENADRYL) 25 MG tablet Take 1 tablet (25 mg total) by mouth every 6 (six) hours as needed. 30 tablet 0  . docusate sodium (COLACE) 100 MG capsule Take 1 capsule (100 mg total) by mouth 2 (two) times daily. 60 capsule 0    . hydrOXYzine (ATARAX/VISTARIL) 25 MG tablet Take 1 tablet (25 mg total) by mouth every 8 (eight) hours as needed for itching. 90 tablet 0  . ketotifen (ZADITOR) 0.025 % ophthalmic solution Place 1 drop into both eyes 2 (two) times daily. 5 mL 1  . loperamide (IMODIUM) 2 MG capsule Take 1 capsule (2 mg total) by mouth 4 (four) times daily as needed for diarrhea or loose stools. 12 capsule 0  . methocarbamol (ROBAXIN) 500 MG tablet Take 1 tablet (500 mg total) by mouth 2 (two) times daily as needed for muscle spasms. 60 tablet 1  . permethrin (ELIMITE) 5 % cream Apply 1 application topically once.    . polyethylene glycol (MIRALAX / GLYCOLAX) 17 g packet Take 17 g by mouth daily. 14 each 0  . triamcinolone cream (KENALOG) 0.1 % Apply 1 application topically 2 (two) times daily. 45 g 1  . albuterol (PROVENTIL HFA;VENTOLIN HFA) 108 (90 Base) MCG/ACT inhaler Inhale 2 puffs into the lungs every 6 (six) hours as needed for wheezing or shortness of breath. 1 Inhaler 6  . atorvastatin (LIPITOR) 80 MG tablet Take 1 tablet (80 mg total) by mouth daily. 30 tablet 6  . buPROPion (WELLBUTRIN SR) 150 MG 12 hr tablet Take 1 tablet (150 mg total) by mouth 2 (two) times daily. 60 tablet 3  . Fluticasone-Salmeterol (ADVAIR  DISKUS) 250-50 MCG/DOSE AEPB Inhale 1 puff into the lungs 2 (two) times daily. 1 each 6  . gabapentin (NEURONTIN) 300 MG capsule Take 1 capsule (300 mg total) by mouth at bedtime. 30 capsule 6  . metFORMIN (GLUCOPHAGE XR) 500 MG 24 hr tablet Take 1 tablet (500 mg total) by mouth daily with breakfast. 30 tablet 6  . tiotropium (SPIRIVA HANDIHALER) 18 MCG inhalation capsule Place 1 capsule (18 mcg total) into inhaler and inhale daily. 30 capsule 6  . predniSONE (DELTASONE) 20 MG tablet Day 1-5: Take 2 tablets daily. Day 6-10: Take 1 tablet daily. Take tablets with breakfast. (Patient not taking: Reported on 11/28/2019) 15 tablet 0   No facility-administered medications prior to visit.      ROS Review of Systems  Constitutional: Negative for activity change and appetite change.  HENT: Negative for sinus pressure and sore throat.   Eyes: Negative for visual disturbance.  Respiratory: Negative for cough, chest tightness and shortness of breath.   Cardiovascular: Negative for chest pain and leg swelling.  Gastrointestinal: Negative for abdominal distention, abdominal pain, constipation and diarrhea.  Endocrine: Negative.   Genitourinary: Negative for dysuria.  Musculoskeletal: Negative for joint swelling and myalgias.  Skin: Negative for rash.  Allergic/Immunologic: Negative.   Neurological: Negative for weakness, light-headedness and numbness.  Psychiatric/Behavioral: Negative for dysphoric mood and suicidal ideas.    Objective:  BP 93/63   Pulse 90   Ht '5\' 5"'  (1.651 m)   Wt 166 lb (75.3 kg)   SpO2 92%   BMI 27.62 kg/m   BP/Weight 11/28/2019 9/40/7680 10/26/1101  Systolic BP 93 159 458  Diastolic BP 63 74 66  Wt. (Lbs) 166 - 169.2  BMI 27.62 - 28.16      Physical Exam Constitutional:      Appearance: He is well-developed.  Neck:     Vascular: No JVD.  Cardiovascular:     Rate and Rhythm: Normal rate.     Heart sounds: Normal heart sounds. No murmur heard.   Pulmonary:     Effort: Pulmonary effort is normal.     Breath sounds: Normal breath sounds. No wheezing or rales.  Chest:     Chest wall: No tenderness.  Abdominal:     General: Bowel sounds are normal. There is no distension.     Palpations: Abdomen is soft. There is no mass.     Tenderness: There is no abdominal tenderness.  Musculoskeletal:        General: Normal range of motion.     Right lower leg: No edema.     Left lower leg: No edema.  Skin:    Findings: No lesion or rash.  Neurological:     Mental Status: He is alert and oriented to person, place, and time.  Psychiatric:        Mood and Affect: Mood normal.     CMP Latest Ref Rng & Units 05/30/2019 01/27/2019 01/26/2019   Glucose 65 - 99 mg/dL 98 118(H) 104(H)  BUN 8 - 27 mg/dL 8 28(H) 27(H)  Creatinine 0.76 - 1.27 mg/dL 0.82 0.83 0.74  Sodium 134 - 144 mmol/L 142 139 139  Potassium 3.5 - 5.2 mmol/L 4.5 4.6 4.4  Chloride 96 - 106 mmol/L 104 103 103  CO2 20 - 29 mmol/L '22 24 24  ' Calcium 8.6 - 10.2 mg/dL 9.6 9.2 8.8(L)  Total Protein 6.0 - 8.5 g/dL 7.1 6.8 6.4(L)  Total Bilirubin 0.0 - 1.2 mg/dL 0.3 0.8 0.6  Alkaline Phos  39 - 117 IU/L 111 78 78  AST 0 - 40 IU/L 19 45(H) 52(H)  ALT 0 - 44 IU/L 16 71(H) 73(H)    Lipid Panel     Component Value Date/Time   CHOL 254 (H) 05/30/2019 0836   TRIG 195 (H) 05/30/2019 0836   HDL 41 05/30/2019 0836   CHOLHDL 6.2 (H) 05/30/2019 0836   CHOLHDL 6.5 (H) 07/21/2015 1625   VLDL 61 (H) 07/21/2015 1625   LDLCALC 176 (H) 05/30/2019 0836    CBC    Component Value Date/Time   WBC 8.0 01/23/2019 0140   RBC 5.28 01/23/2019 0140   HGB 14.3 01/23/2019 0140   HCT 44.6 01/23/2019 0140   PLT 149 (L) 01/23/2019 0140   MCV 84.5 01/23/2019 0140   MCH 27.1 01/23/2019 0140   MCHC 32.1 01/23/2019 0140   RDW 15.4 01/23/2019 0140   LYMPHSABS 0.9 01/23/2019 0140   MONOABS 0.2 01/23/2019 0140   EOSABS 0.0 01/23/2019 0140   BASOSABS 0.0 01/23/2019 0140    Lab Results  Component Value Date   HGBA1C 6.5 11/28/2019    Assessment & Plan:  1. Controlled type 2 diabetes mellitus without complication, without long-term current use of insulin (HCC) Controlled with A1c of 6.5 Continue current regimen Counseled on Diabetic diet, my plate method, 035 minutes of moderate intensity exercise/week Blood sugar logs with fasting goals of 80-120 mg/dl, random of less than 180 and in the event of sugars less than 60 mg/dl or greater than 400 mg/dl encouraged to notify the clinic. Advised on the need for annual eye exams, annual foot exams, Pneumonia vaccine. - POCT glucose (manual entry) - POCT glycosylated hemoglobin (Hb A1C) - metFORMIN (GLUCOPHAGE XR) 500 MG 24 hr tablet; Take 1  tablet (500 mg total) by mouth daily with breakfast.  Dispense: 30 tablet; Refill: 6 - CMP14+EGFR  2. COPD, mild (HCC) No acute exacerbation Advised against ongoing tobacco use - albuterol (VENTOLIN HFA) 108 (90 Base) MCG/ACT inhaler; Inhale 2 puffs into the lungs every 6 (six) hours as needed for wheezing or shortness of breath.  Dispense: 18 g; Refill: 6 - Fluticasone-Salmeterol (ADVAIR DISKUS) 250-50 MCG/DOSE AEPB; Inhale 1 puff into the lungs 2 (two) times daily.  Dispense: 1 each; Refill: 6 - tiotropium (SPIRIVA HANDIHALER) 18 MCG inhalation capsule; Place 1 capsule (18 mcg total) into inhaler and inhale daily.  Dispense: 30 capsule; Refill: 6  3. Hyperlipidemia associated with type 2 diabetes mellitus (HCC) Controlled Low-cholesterol diet - atorvastatin (LIPITOR) 80 MG tablet; Take 1 tablet (80 mg total) by mouth daily.  Dispense: 30 tablet; Refill: 6  4. Chewing tobacco use Spent 4 minutes counseling on tobacco cessation however he is not interested in quitting at this time Also declined lung cancer screening - buPROPion (WELLBUTRIN SR) 150 MG 12 hr tablet; Take 1 tablet (150 mg total) by mouth 2 (two) times daily.  Dispense: 60 tablet; Refill: 3  5. Other diabetic neurological complication associated with type 2 diabetes mellitus (HCC) Stable - gabapentin (NEURONTIN) 300 MG capsule; Take 1 capsule (300 mg total) by mouth at bedtime.  Dispense: 30 capsule; Refill: 6  6. Pruritus No rash evident on physical exam We will prescribe topical steroid - hydrocortisone cream 1 %; Apply 1 application topically 2 (two) times daily.  Dispense: 56 g; Refill: 1  7. Need for immunization against influenza - Flu Vaccine QUAD 36+ mos IM  Health Care Maintenance: Discussed the need for lung cancer screening given the fact that he chews  tobacco consistently however he declined. Meds ordered this encounter  Medications  . albuterol (VENTOLIN HFA) 108 (90 Base) MCG/ACT inhaler    Sig: Inhale 2  puffs into the lungs every 6 (six) hours as needed for wheezing or shortness of breath.    Dispense:  18 g    Refill:  6  . buPROPion (WELLBUTRIN SR) 150 MG 12 hr tablet    Sig: Take 1 tablet (150 mg total) by mouth 2 (two) times daily.    Dispense:  60 tablet    Refill:  3    For tobacco cessation  . Fluticasone-Salmeterol (ADVAIR DISKUS) 250-50 MCG/DOSE AEPB    Sig: Inhale 1 puff into the lungs 2 (two) times daily.    Dispense:  1 each    Refill:  6  . gabapentin (NEURONTIN) 300 MG capsule    Sig: Take 1 capsule (300 mg total) by mouth at bedtime.    Dispense:  30 capsule    Refill:  6  . metFORMIN (GLUCOPHAGE XR) 500 MG 24 hr tablet    Sig: Take 1 tablet (500 mg total) by mouth daily with breakfast.    Dispense:  30 tablet    Refill:  6  . tiotropium (SPIRIVA HANDIHALER) 18 MCG inhalation capsule    Sig: Place 1 capsule (18 mcg total) into inhaler and inhale daily.    Dispense:  30 capsule    Refill:  6  . atorvastatin (LIPITOR) 80 MG tablet    Sig: Take 1 tablet (80 mg total) by mouth daily.    Dispense:  30 tablet    Refill:  6  . hydrocortisone cream 1 %    Sig: Apply 1 application topically 2 (two) times daily.    Dispense:  56 g    Refill:  1    Follow-up: Return in about 6 months (around 05/27/2020) for chronic medical condition.       Charlott Rakes, MD, FAAFP. [facility] and [place], [place]   11/28/2019, 10:35 AM

## 2019-11-28 NOTE — Progress Notes (Signed)
States that he has a cough, has been seen in ED for cough.

## 2019-11-29 LAB — CMP14+EGFR
ALT: 18 IU/L (ref 0–44)
AST: 19 IU/L (ref 0–40)
Albumin/Globulin Ratio: 1.2 (ref 1.2–2.2)
Albumin: 4.4 g/dL (ref 3.8–4.8)
Alkaline Phosphatase: 134 IU/L — ABNORMAL HIGH (ref 48–121)
BUN/Creatinine Ratio: 9 — ABNORMAL LOW (ref 10–24)
BUN: 7 mg/dL — ABNORMAL LOW (ref 8–27)
Bilirubin Total: 0.5 mg/dL (ref 0.0–1.2)
CO2: 27 mmol/L (ref 20–29)
Calcium: 9.6 mg/dL (ref 8.6–10.2)
Chloride: 103 mmol/L (ref 96–106)
Creatinine, Ser: 0.81 mg/dL (ref 0.76–1.27)
GFR calc Af Amer: 104 mL/min/{1.73_m2} (ref >59)
GFR calc non Af Amer: 90 mL/min/{1.73_m2} (ref >59)
Globulin, Total: 3.6 g/dL (ref 1.5–4.5)
Glucose: 94 mg/dL (ref 65–99)
Potassium: 4.6 mmol/L (ref 3.5–5.2)
Sodium: 142 mmol/L (ref 134–144)
Total Protein: 8 g/dL (ref 6.0–8.5)

## 2019-12-05 ENCOUNTER — Telehealth: Payer: Self-pay

## 2019-12-05 NOTE — Telephone Encounter (Signed)
-----   Message from Charlott Rakes, MD sent at 11/29/2019  8:47 AM EDT ----- Please inform the patient that labs are normal. Thank you.

## 2019-12-05 NOTE — Telephone Encounter (Signed)
Patient was called and a voicemail was left informing patient or normal results.

## 2019-12-20 MED FILL — VENTOLIN HFA 90 MCG INHALER: 108 (90 BAS | 25 days supply | Qty: 18 | Fill #1

## 2019-12-20 MED FILL — metFORMIN HCL ER 500 MG TB2: 500 | 30 days supply | Qty: 30 | Fill #0

## 2019-12-20 MED FILL — SPIRIVA 18 MCG CP-HANDIHALE: 18 | 30 days supply | Qty: 30 | Fill #6

## 2019-12-20 MED FILL — ADVAIR 250/50 DISKUS: 250-50 | 30 days supply | Qty: 60 | Fill #6

## 2019-12-20 MED FILL — ATORVASTATIN CALCIUM 80 MG: 80 | 30 days supply | Qty: 30 | Fill #0

## 2020-01-20 MED FILL — metFORMIN HCL ER 500 MG TB2: 500 | 30 days supply | Qty: 30 | Fill #1

## 2020-02-07 MED FILL — ADVAIR 250/50 DISKUS: 250-50 | 30 days supply | Qty: 60 | Fill #0

## 2020-02-07 MED FILL — SPIRIVA 18 MCG CP-HANDIHALE: 18 | 30 days supply | Qty: 30 | Fill #0

## 2020-02-11 MED FILL — ATORVASTATIN CALCIUM 80 MG: 80 | 30 days supply | Qty: 30 | Fill #1

## 2020-02-11 MED FILL — GABAPENTIN 300 MG CAPSULE: 300 | 30 days supply | Qty: 30 | Fill #1

## 2020-03-17 MED FILL — ADVAIR 250/50 DISKUS: 250-50 | 30 days supply | Qty: 60 | Fill #1

## 2020-03-17 MED FILL — metFORMIN HCL ER 500 MG TB2: 500 | 30 days supply | Qty: 30 | Fill #2

## 2020-03-17 MED FILL — SPIRIVA 18 MCG CP-HANDIHALE: 18 | 30 days supply | Qty: 30 | Fill #1

## 2020-03-17 MED FILL — GABAPENTIN 300 MG CAPSULE: 300 | 30 days supply | Qty: 30 | Fill #2

## 2020-03-17 MED FILL — ATORVASTATIN CALCIUM 80 MG: 80 | 30 days supply | Qty: 30 | Fill #2

## 2020-04-23 MED FILL — SPIRIVA 18 MCG CP-HANDIHALE: 18 | 30 days supply | Qty: 30 | Fill #2

## 2020-05-27 ENCOUNTER — Other Ambulatory Visit: Payer: Self-pay | Admitting: Family Medicine

## 2020-05-27 ENCOUNTER — Ambulatory Visit: Payer: Medicare Other | Attending: Family Medicine | Admitting: Family Medicine

## 2020-05-27 ENCOUNTER — Encounter: Payer: Self-pay | Admitting: Family Medicine

## 2020-05-27 ENCOUNTER — Other Ambulatory Visit: Payer: Self-pay

## 2020-05-27 VITALS — BP 109/69 | HR 109 | Ht 65.0 in | Wt 166.0 lb

## 2020-05-27 DIAGNOSIS — E119 Type 2 diabetes mellitus without complications: Secondary | ICD-10-CM

## 2020-05-27 DIAGNOSIS — Z7984 Long term (current) use of oral hypoglycemic drugs: Secondary | ICD-10-CM | POA: Diagnosis not present

## 2020-05-27 DIAGNOSIS — E1149 Type 2 diabetes mellitus with other diabetic neurological complication: Secondary | ICD-10-CM | POA: Diagnosis not present

## 2020-05-27 DIAGNOSIS — J449 Chronic obstructive pulmonary disease, unspecified: Secondary | ICD-10-CM | POA: Diagnosis not present

## 2020-05-27 DIAGNOSIS — E785 Hyperlipidemia, unspecified: Secondary | ICD-10-CM | POA: Insufficient documentation

## 2020-05-27 DIAGNOSIS — E1169 Type 2 diabetes mellitus with other specified complication: Secondary | ICD-10-CM | POA: Diagnosis not present

## 2020-05-27 DIAGNOSIS — Z79899 Other long term (current) drug therapy: Secondary | ICD-10-CM | POA: Diagnosis not present

## 2020-05-27 DIAGNOSIS — Z7951 Long term (current) use of inhaled steroids: Secondary | ICD-10-CM | POA: Insufficient documentation

## 2020-05-27 DIAGNOSIS — I10 Essential (primary) hypertension: Secondary | ICD-10-CM | POA: Insufficient documentation

## 2020-05-27 DIAGNOSIS — L299 Pruritus, unspecified: Secondary | ICD-10-CM | POA: Diagnosis not present

## 2020-05-27 DIAGNOSIS — Z1211 Encounter for screening for malignant neoplasm of colon: Secondary | ICD-10-CM

## 2020-05-27 DIAGNOSIS — Z7982 Long term (current) use of aspirin: Secondary | ICD-10-CM | POA: Insufficient documentation

## 2020-05-27 LAB — POCT GLYCOSYLATED HEMOGLOBIN (HGB A1C): HbA1c, POC (controlled diabetic range): 6.1 % (ref 0.0–7.0)

## 2020-05-27 LAB — GLUCOSE, POCT (MANUAL RESULT ENTRY): POC Glucose: 167 mg/dl — AB (ref 70–99)

## 2020-05-27 MED ORDER — GABAPENTIN 300 MG PO CAPS: 300.0000 mg | ORAL_CAPSULE | Freq: Every day | ORAL | 6 refills | Status: DC

## 2020-05-27 MED ORDER — FLUTICASONE-SALMETEROL 250-50 MCG/DOSE IN AEPB: 1.0000 | INHALATION_SPRAY | Freq: Two times a day (BID) | RESPIRATORY_TRACT | 6 refills | Status: DC

## 2020-05-27 MED ORDER — ALBUTEROL SULFATE HFA 108 (90 BASE) MCG/ACT IN AERS: 2.0000 | INHALATION_SPRAY | Freq: Four times a day (QID) | RESPIRATORY_TRACT | 6 refills | Status: DC | PRN

## 2020-05-27 MED ORDER — SPIRIVA HANDIHALER 18 MCG IN CAPS: 18.0000 ug | ORAL_CAPSULE | Freq: Every day | RESPIRATORY_TRACT | 6 refills | Status: DC

## 2020-05-27 MED ORDER — METFORMIN HCL ER 500 MG PO TB24: 500.0000 mg | ORAL_TABLET | Freq: Every day | ORAL | 6 refills | Status: DC

## 2020-05-27 MED ORDER — ATORVASTATIN CALCIUM 80 MG PO TABS: 80.0000 mg | ORAL_TABLET | Freq: Every day | ORAL | 6 refills | Status: DC

## 2020-05-27 MED FILL — GABAPENTIN 300 MG CAPSULE: 300 | 30 days supply | Qty: 30 | Fill #0

## 2020-05-27 MED FILL — FLUTICASONE-SALMETEROL 250-: 250-50 | 30 days supply | Qty: 60 | Fill #0

## 2020-05-27 MED FILL — metFORMIN HCL ER 500 MG TB2: 500 | 30 days supply | Qty: 30 | Fill #0

## 2020-05-27 MED FILL — SPIRIVA 18 MCG CP-HANDIHALE: 18 | 30 days supply | Qty: 30 | Fill #0

## 2020-05-27 MED FILL — ATORVASTATIN CALCIUM 80 MG: 80 | 30 days supply | Qty: 30 | Fill #0

## 2020-05-27 MED FILL — ALBUTEROL SULFATE HFA 108 (: 108 (90 BAS | 25 days supply | Qty: 18 | Fill #0

## 2020-05-27 NOTE — Progress Notes (Signed)
Subjective:  Patient ID: David Barber, male    DOB: February 11, 1951  Age: 70 y.o. 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 He previously saw Dermatology and underwent a skin biopsy. He received a cream at his visit which has been effective; he no longer uses hydroxyzine.  With regards to his diabetes mellitus he has been compliant with his medications and denies hypoglycemic episodes.  He has no numbness in extremities or visual concerns. Also compliant with his statin. COPD has been stable with no flares. Past Medical History:  Diagnosis Date  . Bronchitis   . COPD (chronic obstructive pulmonary disease) ([place])   . Diabetes mellitus without complication ([place])   . GERD (gastroesophageal reflux disease)   . Hypertension     Past Surgical History:  Procedure Laterality Date  . CYST REMOVAL NECK  2013   benign    Family History  Problem Relation Age of Onset  . Colon cancer Neg Hx     No Known Allergies  Outpatient Medications Prior to Visit  Medication Sig Dispense Refill  . aspirin EC 81 MG tablet Take 1 tablet (81 mg total) by mouth daily. 30 tablet 5  . buPROPion (WELLBUTRIN SR) 150 MG 12 hr tablet Take 1 tablet (150 mg total) by mouth 2 (two) times daily. 60 tablet 3  . cetirizine (ZYRTEC) 10 MG tablet Take 1 tablet (10 mg total) by mouth daily. 30 tablet 11  . docusate sodium (COLACE) 100 MG capsule Take 1 capsule (100 mg total) by mouth 2 (two) times daily. 60 capsule 0  . hydrocortisone cream 1 % Apply 1 application topically 2 (two) times daily. 56 g 1  . hydrOXYzine (ATARAX/VISTARIL) 25 MG tablet Take 1 tablet (25 mg total) by mouth every 8 (eight) hours as needed for  itching. 90 tablet 0  . ketotifen (ZADITOR) 0.025 % ophthalmic solution Place 1 drop into both eyes 2 (two) times daily. 5 mL 1  . loperamide (IMODIUM) 2 MG capsule Take 1 capsule (2 mg total) by mouth 4 (four) times daily as needed for diarrhea or loose stools. 12 capsule 0  . permethrin (ELIMITE) 5 % cream Apply 1 application topically once.    . polyethylene glycol (MIRALAX / GLYCOLAX) 17 g packet Take 17 g by mouth daily. 14 each 0  . triamcinolone cream (KENALOG) 0.1 % Apply 1 application topically 2 (two) times daily. 45 g 1  . albuterol (VENTOLIN HFA) 108 (90 Base) MCG/ACT inhaler Inhale 2 puffs into the lungs every 6 (six) hours as needed for wheezing or shortness of breath. 18 g 6  . atorvastatin (LIPITOR) 80 MG tablet Take 1 tablet (80 mg total) by mouth daily. 30 tablet 6  . diphenhydrAMINE (BENADRYL) 25 MG tablet Take 1 tablet (25 mg total) by mouth every 6 (six) hours as needed. 30 tablet 0  . Fluticasone-Salmeterol (ADVAIR DISKUS) 250-50 MCG/DOSE AEPB Inhale 1 puff into the lungs 2 (two) times daily. 1 each 6  . gabapentin (NEURONTIN) 300 MG capsule Take 1 capsule (300 mg total) by mouth at bedtime. 30 capsule 6  . metFORMIN (GLUCOPHAGE XR) 500 MG 24 hr tablet Take 1 tablet (500 mg total) by mouth daily with breakfast. 30 tablet  6  . methocarbamol (ROBAXIN) 500 MG tablet Take 1 tablet (500 mg total) by mouth 2 (two) times daily as needed for muscle spasms. 60 tablet 1  . tiotropium (SPIRIVA HANDIHALER) 18 MCG inhalation capsule Place 1 capsule (18 mcg total) into inhaler and inhale daily. 30 capsule 6   No facility-administered medications prior to visit.     ROS Review of Systems  Constitutional: Negative for activity change and appetite change.  HENT: Negative for sinus pressure and sore throat.   Eyes: Negative for visual disturbance.  Respiratory: Negative for cough, chest tightness and shortness of breath.   Cardiovascular: Negative for chest pain and leg swelling.   Gastrointestinal: Negative for abdominal distention, abdominal pain, constipation and diarrhea.  Endocrine: Negative.   Genitourinary: Negative for dysuria.  Musculoskeletal: Negative for joint swelling and myalgias.  Skin: Negative for rash.  Allergic/Immunologic: Negative.   Neurological: Negative for weakness, light-headedness and numbness.  Psychiatric/Behavioral: Negative for dysphoric mood and suicidal ideas.    Objective:  BP 109/69   Pulse (!) 109   Ht _0  (1.651 m)   Wt 166 lb (75.3 kg)   SpO2 93%   BMI 27.62 kg/m   BP/Weight 05/27/2020 11/28/2019 0/96/2836  Systolic BP 629 93 476  Diastolic BP 69 63 74  Wt. (Lbs) 166 166 -  BMI 27.62 27.62 -      Physical Exam Constitutional:      Appearance: He is well-developed.  Neck:     Vascular: No JVD.  Cardiovascular:     Rate and Rhythm: Normal rate.     Heart sounds: Normal heart sounds. No murmur heard.   Pulmonary:     Effort: Pulmonary effort is normal.     Breath sounds: Normal breath sounds. No wheezing or rales.  Chest:     Chest wall: No tenderness.  Abdominal:     General: Bowel sounds are normal. There is no distension.     Palpations: Abdomen is soft. There is no mass.     Tenderness: There is no abdominal tenderness.  Musculoskeletal:        General: Normal range of motion.     Right lower leg: No edema.     Left lower leg: No edema.  Skin:    Comments: Scratch marks on forearm  Neurological:     Mental Status: He is alert and oriented to person, place, and time.  Psychiatric:        Mood and Affect: Mood normal.     CMP Latest Ref Rng & Units 11/28/2019 05/30/2019 01/27/2019  Glucose 65 - 99 mg/dL 94 98 118(H)  BUN 8 - 27 mg/dL 7(L) 8 28(H)  Creatinine 0.76 - 1.27 mg/dL 0.81 0.82 0.83  Sodium 134 - 144 mmol/L 142 142 139  Potassium 3.5 - 5.2 mmol/L 4.6 4.5 4.6  Chloride 96 - 106 mmol/L 103 104 103  CO2 20 - 29 mmol/L _1 Calcium 8.6 - 10.2 mg/dL 9.6 9.6 9.2  Total Protein 6.0 - 8.5  g/dL 8.0 7.1 6.8  Total Bilirubin 0.0 - 1.2 mg/dL 0.5 0.3 0.8  Alkaline Phos 48 - 121 IU/L 134(H) 111 78  AST 0 - 40 IU/L 19 19 45(H)  ALT 0 - 44 IU/L 18 16 71(H)    Lipid Panel     Component Value Date/Time   CHOL 254 (H) 05/30/2019 0836   TRIG 195 (H) 05/30/2019 0836   HDL 41 05/30/2019 0836   CHOLHDL 6.2 (H) 05/30/2019 5465  CHOLHDL 6.5 (H) 07/21/2015 1625   VLDL 61 (H) 07/21/2015 1625   LDLCALC 176 (H) 05/30/2019 0836    CBC    Component Value Date/Time   WBC 8.0 01/23/2019 0140   RBC 5.28 01/23/2019 0140   HGB 14.3 01/23/2019 0140   HCT 44.6 01/23/2019 0140   PLT 149 (L) 01/23/2019 0140   MCV 84.5 01/23/2019 0140   MCH 27.1 01/23/2019 0140   MCHC 32.1 01/23/2019 0140   RDW 15.4 01/23/2019 0140   LYMPHSABS 0.9 01/23/2019 0140   MONOABS 0.2 01/23/2019 0140   EOSABS 0.0 01/23/2019 0140   BASOSABS 0.0 01/23/2019 0140    Lab Results  Component Value Date   HGBA1C 6.1 05/27/2020    Assessment & Plan:  1. Controlled type 2 diabetes mellitus without complication, without long-term current use of insulin (HCC) Controlled with A1c of 6.1 Continue current management Counseled on blood pressure goal of less than 130/80, low-sodium, DASH diet, medication compliance, 150 minutes of moderate intensity exercise per week. Discussed medication compliance, adverse effects. - POCT glucose (manual entry) - POCT glycosylated hemoglobin (Hb A1C) - Microalbumin / creatinine urine ratio - metFORMIN (GLUCOPHAGE XR) 500 MG 24 hr tablet; Take 1 tablet (500 mg total) by mouth daily with breakfast.  Dispense: 30 tablet; Refill: 6  2. Hyperlipidemia associated with type 2 diabetes mellitus ([place]) Uncontrolled We will send off lipid panel and adjust regimen accordingly Low-cholesterol diet - atorvastatin (LIPITOR) 80 MG tablet; Take 1 tablet (80 mg total) by mouth daily.  Dispense: 30 tablet; Refill: 6 - CMP14+EGFR; Future - Lipid panel; Future  3. COPD, mild ([place]) Stable with no  recent exacerbation Continue current regimen - albuterol (VENTOLIN HFA) 108 (90 Base) MCG/ACT inhaler; Inhale 2 puffs into the lungs every 6 (six) hours as needed for wheezing or shortness of breath.  Dispense: 18 g; Refill: 6 - Fluticasone-Salmeterol (ADVAIR DISKUS) 250-50 MCG/DOSE AEPB; Inhale 1 puff into the lungs 2 (two) times daily.  Dispense: 1 each; Refill: 6 - tiotropium (SPIRIVA HANDIHALER) 18 MCG inhalation capsule; Place 1 capsule (18 mcg total) into inhaler and inhale daily.  Dispense: 30 capsule; Refill: 6  4. Other diabetic neurological complication associated with type 2 diabetes mellitus (HCC) Controlled - gabapentin (NEURONTIN) 300 MG capsule; Take 1 capsule (300 mg total) by mouth at bedtime.  Dispense: 30 capsule; Refill: 6  5. Pruritus He is doing well on cream prescribed by dermatology Advised to follow-up with them  6. Screening for colon cancer - Ambulatory referral to Gastroenterology    Meds ordered this encounter  Medications  . atorvastatin (LIPITOR) 80 MG tablet    Sig: Take 1 tablet (80 mg total) by mouth daily.    Dispense:  30 tablet    Refill:  6  . albuterol (VENTOLIN HFA) 108 (90 Base) MCG/ACT inhaler    Sig: Inhale 2 puffs into the lungs every 6 (six) hours as needed for wheezing or shortness of breath.    Dispense:  18 g    Refill:  6  . Fluticasone-Salmeterol (ADVAIR DISKUS) 250-50 MCG/DOSE AEPB    Sig: Inhale 1 puff into the lungs 2 (two) times daily.    Dispense:  1 each    Refill:  6  . gabapentin (NEURONTIN) 300 MG capsule    Sig: Take 1 capsule (300 mg total) by mouth at bedtime.    Dispense:  30 capsule    Refill:  6  . metFORMIN (GLUCOPHAGE XR) 500 MG 24 hr tablet  Sig: Take 1 tablet (500 mg total) by mouth daily with breakfast.    Dispense:  30 tablet    Refill:  6  . tiotropium (SPIRIVA HANDIHALER) 18 MCG inhalation capsule    Sig: Place 1 capsule (18 mcg total) into inhaler and inhale daily.    Dispense:  30 capsule     Refill:  6    Follow-up: Return in about 6 months (around 11/27/2020) for chronic disease management.       Charlott Rakes, MD, FAAFP. [facility] [place] [facility] [place], [place]   05/27/2020, 10:24 AM

## 2020-05-27 NOTE — Progress Notes (Signed)
Pt states that he is having itching on his elbow, thighs and knees.

## 2020-05-28 ENCOUNTER — Other Ambulatory Visit: Payer: Medicare Other

## 2020-05-28 DIAGNOSIS — E785 Hyperlipidemia, unspecified: Secondary | ICD-10-CM

## 2020-05-28 DIAGNOSIS — E1169 Type 2 diabetes mellitus with other specified complication: Secondary | ICD-10-CM

## 2020-05-28 LAB — MICROALBUMIN / CREATININE URINE RATIO
Creatinine, Urine: 288.4 mg/dL
Microalb/Creat Ratio: 23 mg/g creat (ref 0–29)
Microalbumin, Urine: 66.6 ug/mL

## 2020-05-29 ENCOUNTER — Other Ambulatory Visit: Payer: Self-pay | Admitting: Family Medicine

## 2020-05-29 ENCOUNTER — Telehealth: Payer: Self-pay

## 2020-05-29 LAB — CMP14+EGFR
ALT: 23 IU/L (ref 0–44)
AST: 23 IU/L (ref 0–40)
Albumin/Globulin Ratio: 1.6 (ref 1.2–2.2)
Albumin: 4.5 g/dL (ref 3.7–4.7)
Alkaline Phosphatase: 122 IU/L — ABNORMAL HIGH (ref 44–121)
BUN/Creatinine Ratio: 9 — ABNORMAL LOW (ref 10–24)
BUN: 8 mg/dL (ref 8–27)
Bilirubin Total: 0.6 mg/dL (ref 0.0–1.2)
CO2: 25 mmol/L (ref 20–29)
Calcium: 10 mg/dL (ref 8.6–10.2)
Chloride: 104 mmol/L (ref 96–106)
Creatinine, Ser: 0.85 mg/dL (ref 0.76–1.27)
Globulin, Total: 2.9 g/dL (ref 1.5–4.5)
Glucose: 97 mg/dL (ref 65–99)
Potassium: 5.2 mmol/L (ref 3.5–5.2)
Sodium: 144 mmol/L (ref 134–144)
Total Protein: 7.4 g/dL (ref 6.0–8.5)
eGFR: 93 mL/min/{1.73_m2} (ref >59)

## 2020-05-29 LAB — LIPID PANEL
Chol/HDL Ratio: 5.8 ratio — ABNORMAL HIGH (ref 0.0–5.0)
Cholesterol, Total: 242 mg/dL — ABNORMAL HIGH (ref 100–199)
HDL: 42 mg/dL (ref >39)
LDL Chol Calc (NIH): 172 mg/dL — ABNORMAL HIGH (ref 0–99)
Triglycerides: 150 mg/dL — ABNORMAL HIGH (ref 0–149)
VLDL Cholesterol Cal: 28 mg/dL (ref 5–40)

## 2020-05-29 MED ORDER — ROSUVASTATIN CALCIUM 20 MG PO TABS: 20.0000 mg | ORAL_TABLET | Freq: Every day | ORAL | 6 refills | Status: DC

## 2020-05-29 NOTE — Telephone Encounter (Signed)
Patient was called and a voicemail was left informing patient to return phone call for lab results. 

## 2020-05-29 NOTE — Telephone Encounter (Signed)
-----   Message from Charlott Rakes, MD sent at 05/28/2020  6:53 PM EST ----- Please inform the patient that labs are normal. Thank you.

## 2020-05-29 NOTE — Telephone Encounter (Signed)
-----   Message from Charlott Rakes, MD sent at 05/29/2020 10:14 AM EST ----- I checked with the pharmacy and it looks like he already picked up his atorvastatin.  Once he has completed this bottle of atorvastatin please advise him to switch to Crestor which will be at the pharmacy for pickup.  Thank you

## 2020-05-29 NOTE — Telephone Encounter (Signed)
-----   Message from Charlott Rakes, MD sent at 05/29/2020 10:09 AM EST ----- Please inform him that his cholesterol remains elevated.  I have switched him from atorvastatin to Crestor and he is advised to comply with a low-cholesterol diet.  Other labs are stable.

## 2020-06-30 ENCOUNTER — Other Ambulatory Visit: Payer: Self-pay

## 2020-06-30 MED FILL — Fluticasone-Salmeterol Aer Powder BA 250-50 MCG/ACT: RESPIRATORY_TRACT | 30 days supply | Qty: 60 | Fill #0 | Status: AC

## 2020-06-30 MED FILL — Tiotropium Bromide Monohydrate Inhal Cap 18 MCG (Base Equiv): RESPIRATORY_TRACT | 30 days supply | Qty: 30 | Fill #0 | Status: AC

## 2020-06-30 MED FILL — Gabapentin Cap 300 MG: ORAL | 30 days supply | Qty: 30 | Fill #0 | Status: AC

## 2020-06-30 MED FILL — Metformin HCl Tab ER 24HR 500 MG: ORAL | 30 days supply | Qty: 30 | Fill #0 | Status: AC

## 2020-07-02 ENCOUNTER — Other Ambulatory Visit: Payer: Self-pay

## 2020-07-16 ENCOUNTER — Other Ambulatory Visit: Payer: Self-pay

## 2020-07-17 ENCOUNTER — Other Ambulatory Visit: Payer: Self-pay

## 2020-07-17 MED ORDER — FLUTICASONE-SALMETEROL 250-50 MCG/ACT IN AEPB
1.0000 | INHALATION_SPRAY | Freq: Two times a day (BID) | RESPIRATORY_TRACT | 5 refills | Status: DC
  Filled 2020-08-24 – 2020-09-02 (×2): qty 60, 30d supply, fill #0
  Filled 2020-10-12: qty 60, 30d supply, fill #1
  Filled 2020-11-13: qty 60, 30d supply, fill #2
  Filled 2020-12-22: qty 60, 30d supply, fill #3
  Filled 2021-02-01: qty 60, 30d supply, fill #4
  Filled 2021-03-03: qty 60, 30d supply, fill #5

## 2020-07-20 ENCOUNTER — Other Ambulatory Visit: Payer: Self-pay

## 2020-07-20 ENCOUNTER — Encounter: Payer: Self-pay | Admitting: Internal Medicine

## 2020-07-20 MED ORDER — FLUTICASONE-SALMETEROL 250-50 MCG/ACT IN AEPB: INHALATION_SPRAY | RESPIRATORY_TRACT | 4 refills | Status: DC

## 2020-07-23 LAB — HM DIABETES EYE EXAM

## 2020-08-03 ENCOUNTER — Other Ambulatory Visit: Payer: Self-pay

## 2020-08-03 MED FILL — Tiotropium Bromide Monohydrate Inhal Cap 18 MCG (Base Equiv): RESPIRATORY_TRACT | 30 days supply | Qty: 30 | Fill #1 | Status: AC

## 2020-08-05 ENCOUNTER — Other Ambulatory Visit: Payer: Self-pay

## 2020-08-24 ENCOUNTER — Other Ambulatory Visit: Payer: Self-pay

## 2020-08-24 ENCOUNTER — Encounter: Payer: Self-pay | Admitting: Family Medicine

## 2020-08-24 ENCOUNTER — Ambulatory Visit: Payer: Medicare Other | Attending: Family Medicine | Admitting: Family Medicine

## 2020-08-24 VITALS — BP 93/60 | HR 97 | Ht 65.0 in | Wt 167.0 lb

## 2020-08-24 DIAGNOSIS — E1169 Type 2 diabetes mellitus with other specified complication: Secondary | ICD-10-CM | POA: Diagnosis present

## 2020-08-24 DIAGNOSIS — Z79899 Other long term (current) drug therapy: Secondary | ICD-10-CM | POA: Diagnosis not present

## 2020-08-24 DIAGNOSIS — Z1211 Encounter for screening for malignant neoplasm of colon: Secondary | ICD-10-CM

## 2020-08-24 DIAGNOSIS — J449 Chronic obstructive pulmonary disease, unspecified: Secondary | ICD-10-CM | POA: Insufficient documentation

## 2020-08-24 DIAGNOSIS — E785 Hyperlipidemia, unspecified: Secondary | ICD-10-CM | POA: Diagnosis not present

## 2020-08-24 DIAGNOSIS — Z8616 Personal history of COVID-19: Secondary | ICD-10-CM | POA: Insufficient documentation

## 2020-08-24 DIAGNOSIS — Z7984 Long term (current) use of oral hypoglycemic drugs: Secondary | ICD-10-CM | POA: Insufficient documentation

## 2020-08-24 DIAGNOSIS — Z7951 Long term (current) use of inhaled steroids: Secondary | ICD-10-CM | POA: Diagnosis not present

## 2020-08-24 DIAGNOSIS — E119 Type 2 diabetes mellitus without complications: Secondary | ICD-10-CM

## 2020-08-24 DIAGNOSIS — Z23 Encounter for immunization: Secondary | ICD-10-CM | POA: Diagnosis not present

## 2020-08-24 DIAGNOSIS — Z7982 Long term (current) use of aspirin: Secondary | ICD-10-CM | POA: Insufficient documentation

## 2020-08-24 LAB — POCT GLYCOSYLATED HEMOGLOBIN (HGB A1C): HbA1c, POC (controlled diabetic range): 6.4 % (ref 0.0–7.0)

## 2020-08-24 LAB — GLUCOSE, POCT (MANUAL RESULT ENTRY): POC Glucose: 121 mg/dl — AB (ref 70–99)

## 2020-08-24 NOTE — Progress Notes (Signed)
Pt states that he does not use O2 at home. Only used O2 after having Covid infection.

## 2020-08-24 NOTE — Patient Instructions (Signed)
Preventing High Cholesterol Cholesterol is a white, waxy substance similar to fat that the human body needs to help build cells. The liver makes all the cholesterol that a person's body needs. Having high cholesterol (hypercholesterolemia) increases your risk for heart disease and stroke. Extra or excess cholesterol comes from the food that you eat. High cholesterol can often be prevented with diet and lifestyle changes. If you already have high cholesterol, you can control it with diet, lifestyle changes, and medicines. How can high cholesterol affect me? If you have high cholesterol, fatty deposits (plaques) may build up on the walls of your blood vessels. The blood vessels that carry blood away from your heart are called arteries. Plaques make the arteries narrower and stiffer. This in turn can:  Restrict or block blood flow and cause blood clots to form.  Increase your risk for heart attack and stroke. What can increase my risk for high cholesterol? This condition is more likely to develop in people who:  Eat foods that are high in saturated fat or cholesterol. Saturated fat is mostly found in foods that come from animal sources.  Are overweight.  Are not getting enough exercise.  Have a family history of high cholesterol (familial hypercholesterolemia). What actions can I take to prevent this? Nutrition  Eat less saturated fat.  Avoid trans fats (partially hydrogenated oils). These are often found in margarine and in some baked goods, fried foods, and snacks bought in packages.  Avoid precooked or cured meat, such as bacon, sausages, or meat loaves.  Avoid foods and drinks that have added sugars.  Eat more fruits, vegetables, and whole grains.  Choose healthy sources of protein, such as fish, poultry, lean cuts of red meat, beans, peas, lentils, and nuts.  Choose healthy sources of fat, such as: ? Nuts. ? Vegetable oils, especially olive oil. ? Fish that have healthy fats,  such as omega-3 fatty acids. These fish include mackerel or salmon.   Lifestyle  Lose weight if you are overweight. Maintaining a healthy body mass index (BMI) can help prevent or control high cholesterol. It can also lower your risk for diabetes and high blood pressure. Ask your health care provider to help you with a diet and exercise plan to lose weight safely.  Do not use any products that contain nicotine or tobacco, such as cigarettes, e-cigarettes, and chewing tobacco. If you need help quitting, ask your health care provider. Alcohol use  Do not drink alcohol if: ? Your health care provider tells you not to drink. ? You are pregnant, may be pregnant, or are planning to become pregnant.  If you drink alcohol: ? Limit how much you use to:  0-1 drink a day for women.  0-2 drinks a day for men. ? Be aware of how much alcohol is in your drink. In the [place] one drink equals one 12 oz bottle of beer (355 mL), one 5 oz glass of wine (148 mL), or one 1 oz glass of hard liquor (44 mL). Activity  Get enough exercise. Do exercises as told by your health care provider.  Each week, do at least 150 minutes of exercise that takes a medium level of effort (moderate-intensity exercise). This kind of exercise: ? Makes your heart beat faster while allowing you to still be able to talk. ? Can be done in short sessions several times a day or longer sessions a few times a week. For example, on 5 days each week, you could walk fast or ride   your bike 3 times a day for 10 minutes each time.   Medicines  Your health care provider may recommend medicines to help lower cholesterol. This may be a medicine to lower the amount of cholesterol that your liver makes. You may need medicine if: ? Diet and lifestyle changes have not lowered your cholesterol enough. ? You have high cholesterol and other risk factors for heart disease or stroke.  Take over-the-counter and prescription medicines only as told by your  health care provider. General information  Manage your risk factors for high cholesterol. Talk with your health care provider about all your risk factors and how to lower your risk.  Manage other conditions that you have, such as diabetes or high blood pressure (hypertension).  Have blood tests to check your cholesterol levels at regular points in time as told by your health care provider.  Keep all follow-up visits as told by your health care provider. This is important. Where to find more information  American Heart Association: www.heart.org  National Heart, Lung, and Blood Institute: www.nhlbi.nih.gov Summary  High cholesterol increases your risk for heart disease and stroke. By keeping your cholesterol level low, you can reduce your risk for these conditions.  High cholesterol can often be prevented with diet and lifestyle changes.  Work with your health care provider to manage your risk factors, and have your blood tested regularly. This information is not intended to replace advice given to you by your health care provider. Make sure you discuss any questions you have with your health care provider. Document Revised: 12/18/2018 Document Reviewed: 12/18/2018 Elsevier Patient Education  2021 Elsevier Inc.  

## 2020-08-24 NOTE — Progress Notes (Signed)
Subjective:  Patient ID: David Barber, male    DOB: [birth year]  Age: 70 y.o. MRN: [id number]  CC: Diabetes   HPI David Barber  is a67 year old male with a history of COPD, type 2 diabetes mellitus (A1c6.4), hyperlipidemia COPD, tobacco use (chews tobacco), previous history of COVID who presents today for oxygen recertification.  Interval History: He required supplemental oxygen post COVID for 2 weeks and after that has not needed oxygen.  His oxygen saturation at rest on exertion has not fallen below 93%. His COPD is controlled on a combination of Advair, Spiriva and albuterol. He is compliant with his statin and has no myalgias from his medication. He is currently on metformin for management of his diabetes mellitus and has no hypoglycemic side effects.  Denies presence of paresthesia or blurry vision. He is seen with the aid of a video interpreter. Past Medical History:  Diagnosis Date  . Bronchitis   . COPD (chronic obstructive pulmonary disease) ([place])   . Diabetes mellitus without complication ([place])   . GERD (gastroesophageal reflux disease)   . Hypertension     Past Surgical History:  Procedure Laterality Date  . CYST REMOVAL NECK  2013   benign    Family History  Problem Relation Age of Onset  . Colon cancer Neg Hx     No Known Allergies  Outpatient Medications Prior to Visit  Medication Sig Dispense Refill  . albuterol (VENTOLIN HFA) 108 (90 Base) MCG/ACT inhaler INHALE 2 PUFFS INTO THE LUNGS EVERY 6 (SIX) HOURS AS NEEDED FOR WHEEZING OR SHORTNESS OF BREATH. 18 g 6  . aspirin EC 81 MG tablet Take 1 tablet (81 mg total) by mouth daily. 30 tablet 5  . buPROPion (WELLBUTRIN SR) 150 MG 12 hr tablet Take 1 tablet (150 mg total) by mouth 2 (two) times daily. 60 tablet 3  . cetirizine (ZYRTEC) 10 MG tablet Take 1 tablet (10 mg total) by mouth daily. 30 tablet 11  . docusate sodium (COLACE) 100 MG capsule Take 1 capsule (100 mg total) by mouth 2 (two) times  daily. 60 capsule 0  . fluticasone-salmeterol (ADVAIR) 250-50 MCG/ACT AEPB Inhale 1 puff into the lungs 2 (two) times daily. 60 each 5  . gabapentin (NEURONTIN) 300 MG capsule TAKE 1 CAPSULE (300 MG TOTAL) BY MOUTH AT BEDTIME. 30 capsule 6  . hydrocortisone cream 1 % Apply 1 application topically 2 (two) times daily. 56 g 1  . hydrOXYzine (ATARAX/VISTARIL) 25 MG tablet Take 1 tablet (25 mg total) by mouth every 8 (eight) hours as needed for itching. 90 tablet 0  . ketotifen (ZADITOR) 0.025 % ophthalmic solution Place 1 drop into both eyes 2 (two) times daily. 5 mL 1  . loperamide (IMODIUM) 2 MG capsule Take 1 capsule (2 mg total) by mouth 4 (four) times daily as needed for diarrhea or loose stools. 12 capsule 0  . metFORMIN (GLUCOPHAGE-XR) 500 MG 24 hr tablet TAKE 1 TABLET (500 MG TOTAL) BY MOUTH DAILY WITH BREAKFAST. 30 tablet 6  . permethrin (ELIMITE) 5 % cream Apply 1 application topically once.    . polyethylene glycol (MIRALAX / GLYCOLAX) 17 g packet Take 17 g by mouth daily. 14 each 0  . rosuvastatin (CRESTOR) 20 MG tablet TAKE 1 TABLET (20 MG TOTAL) BY MOUTH DAILY. 30 tablet 6  . tiotropium (SPIRIVA) 18 MCG inhalation capsule PLACE 1 CAPSULE (18 MCG TOTAL) INTO INHALER AND INHALE DAILY. 30 capsule 6  . triamcinolone cream (KENALOG) 0.1 % Apply  1 application topically 2 (two) times daily. 45 g 1   No facility-administered medications prior to visit.     ROS Review of Systems  Constitutional: Negative for activity change and appetite change.  HENT: Negative for sinus pressure and sore throat.   Eyes: Negative for visual disturbance.  Respiratory: Negative for cough, chest tightness and shortness of breath.   Cardiovascular: Negative for chest pain and leg swelling.  Gastrointestinal: Negative for abdominal distention, abdominal pain, constipation and diarrhea.  Endocrine: Negative.   Genitourinary: Negative for dysuria.  Musculoskeletal: Negative for joint swelling and myalgias.   Skin: Negative for rash.  Allergic/Immunologic: Negative.   Neurological: Negative for weakness, light-headedness and numbness.  Psychiatric/Behavioral: Negative for dysphoric mood and suicidal ideas.    Objective:  BP 93/60   Pulse 97   Ht 5\' 5"  (1.651 m)   Wt 167 lb (75.8 kg)   SpO2 93%   BMI 27.79 kg/m   BP/Weight 08/24/2020 09/24/5463 0/05/5463  Systolic BP 93 681 93  Diastolic BP 60 69 63  Wt. (Lbs) 167 166 166  BMI 27.79 27.62 27.62      Physical Exam Constitutional:      Appearance: He is well-developed.  Neck:     Vascular: No JVD.  Cardiovascular:     Rate and Rhythm: Normal rate.     Heart sounds: Normal heart sounds. No murmur heard.   Pulmonary:     Effort: Pulmonary effort is normal.     Breath sounds: Normal breath sounds. No wheezing or rales.  Chest:     Chest wall: No tenderness.  Abdominal:     General: Bowel sounds are normal. There is no distension.     Palpations: Abdomen is soft. There is no mass.     Tenderness: There is no abdominal tenderness.  Musculoskeletal:        General: Normal range of motion.     Right lower leg: No edema.     Left lower leg: No edema.  Neurological:     Mental Status: He is alert and oriented to person, place, and time.  Psychiatric:        Mood and Affect: Mood normal.     CMP Latest Ref Rng & Units 05/28/2020 11/28/2019 05/30/2019  Glucose 65 - 99 mg/dL 97 94 98  BUN 8 - 27 mg/dL 8 7(L) 8  Creatinine 0.76 - 1.27 mg/dL 0.85 0.81 0.82  Sodium 134 - 144 mmol/L 144 142 142  Potassium 3.5 - 5.2 mmol/L 5.2 4.6 4.5  Chloride 96 - 106 mmol/L 104 103 104  CO2 20 - 29 mmol/L 25 27 22   Calcium 8.6 - 10.2 mg/dL 10.0 9.6 9.6  Total Protein 6.0 - 8.5 g/dL 7.4 8.0 7.1  Total Bilirubin 0.0 - 1.2 mg/dL 0.6 0.5 0.3  Alkaline Phos 44 - 121 IU/L 122(H) 134(H) 111  AST 0 - 40 IU/L 23 19 19   ALT 0 - 44 IU/L 23 18 16     Lipid Panel     Component Value Date/Time   CHOL 242 (H) 05/28/2020 0956   TRIG 150 (H) 05/28/2020  0956   HDL 42 05/28/2020 0956   CHOLHDL 5.8 (H) 05/28/2020 0956   CHOLHDL 6.5 (H) 07/21/2015 1625   VLDL 61 (H) 07/21/2015 1625   LDLCALC 172 (H) 05/28/2020 0956    CBC    Component Value Date/Time   WBC 8.0 01/23/2019 0140   RBC 5.28 01/23/2019 0140   HGB 14.3 01/23/2019 0140   HCT  44.6 01/23/2019 0140   PLT 149 (L) 01/23/2019 0140   MCV 84.5 01/23/2019 0140   MCH 27.1 01/23/2019 0140   MCHC 32.1 01/23/2019 0140   RDW 15.4 01/23/2019 0140   LYMPHSABS 0.9 01/23/2019 0140   MONOABS 0.2 01/23/2019 0140   EOSABS 0.0 01/23/2019 0140   BASOSABS 0.0 01/23/2019 0140    Lab Results  Component Value Date   HGBA1C 6.4 08/24/2020    Assessment & Plan:  1. Controlled type 2 diabetes mellitus without complication, without long-term current use of insulin (HCC) Controlled with A1c of 6.4 Continue current regimen Counseled on Diabetic diet, my plate method, 465 minutes of moderate intensity exercise/week Blood sugar logs with fasting goals of 80-120 mg/dl, random of less than 180 and in the event of sugars less than 60 mg/dl or greater than 400 mg/dl encouraged to notify the clinic. Advised on the need for annual eye exams, annual foot exams, Pneumonia vaccine. - POCT glucose (manual entry) - POCT glycosylated hemoglobin (Hb A1C)  2. Screening for colon cancer Has upcoming appointment for colonoscopy in 09/2020  3. COPD, mild ([place]) Stable with no flares He does have refills for Advair to pharmacy and has been advised of this Does not meet requirement for oxygen and clinically does not require this.  4. Hyperlipidemia associated with type 2 diabetes mellitus ([place]) Uncontrolled from last set of labs His atorvastatin was substituted with Crestor Low-cholesterol diet Lipid panel at next visit  5. Need for pneumococcal vaccine - Pneumococcal conjugate vaccine 20-valent  No orders of the defined types were placed in this encounter.   Follow-up: Return in about 3 months (around  11/24/2020) for Medical conditions.       Charlott Rakes, MD, FAAFP. [facility] [place], [place]   08/24/2020, 1:21 PM

## 2020-08-31 ENCOUNTER — Other Ambulatory Visit: Payer: Self-pay

## 2020-09-02 ENCOUNTER — Other Ambulatory Visit: Payer: Self-pay

## 2020-09-04 ENCOUNTER — Other Ambulatory Visit: Payer: Self-pay

## 2020-09-04 MED FILL — Tiotropium Bromide Monohydrate Inhal Cap 18 MCG (Base Equiv): RESPIRATORY_TRACT | 30 days supply | Qty: 30 | Fill #2 | Status: AC

## 2020-09-29 ENCOUNTER — Other Ambulatory Visit: Payer: Self-pay

## 2020-09-29 ENCOUNTER — Ambulatory Visit (AMBULATORY_SURGERY_CENTER): Payer: Medicare Other

## 2020-09-29 VITALS — Ht 65.0 in | Wt 175.0 lb

## 2020-09-29 DIAGNOSIS — Z86010 Personal history of colonic polyps: Secondary | ICD-10-CM

## 2020-09-29 MED ORDER — NA SULFATE-K SULFATE-MG SULF 17.5-3.13-1.6 GM/177ML PO SOLN
1.0000 | ORAL | 0 refills | Status: DC
  Filled 2020-09-29: qty 354, 1d supply, fill #0

## 2020-09-29 NOTE — Progress Notes (Signed)
Patient's son came to the office for his appoint today.  Patient did not come.  Pt's son speaks english.  A translator that speaks Nepali was also here.  We did the pre-visit with the patient on the phone, the translator translated to the pt and he answered his questions.  Patient's pre-visit was done today over the phone with the patient due to COVID-19 pandemic. Name,DOB and address verified. Insurance verified. Patient denies any allergies to Eggs and Soy. Patient denies any problems with anesthesia/sedation. Patient denies taking diet pills or blood thinners. Packet of Prep instructions was given to the patients son to give to his father,patient ,including a copy of a consent form-pt is aware. Patient understands to call us back with any questions or concerns. Patient is aware of our care-partner policy and CWUGQ-91 safety protocol.   EMMI education assigned to the patient for the procedure, sent to [facility].   The patient is COVID-19 vaccinated, per patient.

## 2020-10-05 ENCOUNTER — Other Ambulatory Visit: Payer: Self-pay

## 2020-10-06 ENCOUNTER — Other Ambulatory Visit: Payer: Self-pay

## 2020-10-06 ENCOUNTER — Encounter: Payer: Self-pay | Admitting: Internal Medicine

## 2020-10-06 ENCOUNTER — Ambulatory Visit (AMBULATORY_SURGERY_CENTER): Payer: Medicare Other | Admitting: Internal Medicine

## 2020-10-06 VITALS — BP 101/53 | HR 83 | Temp 98.3°F | Resp 14 | Ht 65.0 in | Wt 175.0 lb

## 2020-10-06 DIAGNOSIS — Z86010 Personal history of colonic polyps: Secondary | ICD-10-CM | POA: Diagnosis not present

## 2020-10-06 MED ORDER — SODIUM CHLORIDE 0.9 % IV SOLN: 500.0000 mL | Freq: Once | INTRAVENOUS | Status: DC

## 2020-10-06 NOTE — Patient Instructions (Signed)
Resume previous diet and medications.  YOU HAD AN ENDOSCOPIC PROCEDURE TODAY AT [facility]:   Refer to the procedure report that was given to you for any specific questions about what was found during the examination.  If the procedure report does not answer your questions, please call your gastroenterologist to clarify.  If you requested that your care partner not be given the details of your procedure findings, then the procedure report has been included in a sealed envelope for you to review at your convenience later.  YOU SHOULD EXPECT: Some feelings of bloating in the abdomen. Passage of more gas than usual.  Walking can help get rid of the air that was put into your GI tract during the procedure and reduce the bloating. If you had a lower endoscopy (such as a colonoscopy or flexible sigmoidoscopy) you may notice spotting of blood in your stool or on the toilet paper. If you underwent a bowel prep for your procedure, you may not have a normal bowel movement for a few days.  Please Note:  You might notice some irritation and congestion in your nose or some drainage.  This is from the oxygen used during your procedure.  There is no need for concern and it should clear up in a day or so.  SYMPTOMS TO REPORT IMMEDIATELY:  Following lower endoscopy (colonoscopy or flexible sigmoidoscopy):  Excessive amounts of blood in the stool  Significant tenderness or worsening of abdominal pains  Swelling of the abdomen that is new, acute  Fever of 100F or higher   For urgent or emergent issues, a gastroenterologist can be reached at any hour by calling [phone]. Do not use MyChart messaging for urgent concerns.    DIET:  We do recommend a small meal at first, but then you may proceed to your regular diet.  Drink plenty of fluids but you should avoid alcoholic beverages for 24 hours.  ACTIVITY:  You should plan to take it easy for the rest of today and you should NOT DRIVE or use  heavy machinery until tomorrow (because of the sedation medicines used during the test).    FOLLOW UP: Our staff will call the number listed on your records 48-72 hours following your procedure to check on you and address any questions or concerns that you may have regarding the information given to you following your procedure. If we do not reach you, we will leave a message.  We will attempt to reach you two times.  During this call, we will ask if you have developed any symptoms of COVID 19. If you develop any symptoms (ie: fever, flu-like symptoms, shortness of breath, cough etc.) before then, please call [phone].  If you test positive for Covid 19 in the 2 weeks post procedure, please call and report this information to [place].    If any biopsies were taken you will be contacted by phone or by letter within the next 1-3 weeks.  Please call us at [phone] if you have not heard about the biopsies in 3 weeks.    SIGNATURES/CONFIDENTIALITY: You and/or your care partner have signed paperwork which will be entered into your electronic medical record.  These signatures attest to the fact that that the information above on your After Visit Summary has been reviewed and is understood.  Full responsibility of the confidentiality of this discharge information lies with you and/or your care-partner.

## 2020-10-06 NOTE — Progress Notes (Signed)
A/ox3, pleased with MAC, report to RN 

## 2020-10-06 NOTE — Progress Notes (Signed)
Pt's states no medical or surgical changes since previsit or office visit. 

## 2020-10-06 NOTE — Op Note (Signed)
[facility] Patient Name: David Barber Procedure Date: 10/06/2020 3:33 PM MRN: [id number] Endoscopist: Docia Chuck. Henrene Pastor , MD Age: 70 Referring MD:  Date of Birth: December 19, 1950 Gender: Male Account #: [id number] Procedure:                Colonoscopy Indications:              High risk colon cancer surveillance: Personal                            history of sessile serrated colon polyp (less than                            10 mm in size) with no dysplasia. Previous                            examination January 2017 Medicines:                Monitored Anesthesia Care Procedure:                Pre-Anesthesia Assessment:                           - Prior to the procedure, a History and Physical                            was performed, and patient medications and                            allergies were reviewed. The patient's tolerance of                            previous anesthesia was also reviewed. The risks                            and benefits of the procedure and the sedation                            options and risks were discussed with the patient.                            All questions were answered, and informed consent                            was obtained. Prior Anticoagulants: The patient has                            taken no previous anticoagulant or antiplatelet                            agents. ASA Grade Assessment: II - A patient with                            mild systemic disease. After reviewing the risks  and benefits, the patient was deemed in                            satisfactory condition to undergo the procedure.                           After obtaining informed consent, the colonoscope                            was passed under direct vision. Throughout the                            procedure, the patient's blood pressure, pulse, and                            oxygen saturations were monitored continuously. The                             Olympus CF-HQ190L was introduced through the anus                            and advanced to the the cecum, identified by                            appendiceal orifice and ileocecal valve. The                            ileocecal valve, appendiceal orifice, and rectum                            were photographed. The quality of the bowel                            preparation was good. The colonoscopy was performed                            without difficulty. The patient tolerated the                            procedure well. The bowel preparation used was                            SUPREP via split dose instruction. Scope In: 0:86:57 PM Scope Out: 4:01:12 PM Scope Withdrawal Time: 0 hours 11 minutes 34 seconds  Total Procedure Duration: 0 hours 14 minutes 54 seconds  Findings:                 The entire examined colon appeared normal on direct                            and retroflexion views. Complications:            No immediate complications. Estimated blood loss:  None. Estimated Blood Loss:     Estimated blood loss: none. Impression:               - The entire examined colon is normal on direct and                            retroflexion views.                           - No specimens collected. Recommendation:           - Repeat colonoscopy is not recommended for                            surveillance.                           - Patient has a contact number available for                            emergencies. The signs and symptoms of potential                            delayed complications were discussed with the                            patient. Return to normal activities tomorrow.                            Written discharge instructions were provided to the                            patient.                           - Resume previous diet.                           - Continue present medications. Docia Chuck.  Henrene Pastor, MD 10/06/2020 4:07:10 PM This report has been signed electronically.

## 2020-10-06 NOTE — Progress Notes (Signed)
C.W. vital signs. 

## 2020-10-08 ENCOUNTER — Telehealth: Payer: Self-pay

## 2020-10-08 NOTE — Telephone Encounter (Signed)
  Follow up Call-  Call back number 10/06/2020  Post procedure Call Back phone  # [phone] son cell phone  Permission to leave phone message Yes  Some recent data might be hidden     Patient questions:  Do you have a fever, pain , or abdominal swelling? No. Pain Score  0 *  Have you tolerated food without any problems? Yes.    Have you been able to return to your normal activities? Yes.    Do you have any questions about your discharge instructions: Diet   No. Medications  No. Follow up visit  No.  Do you have questions or concerns about your Care? No.  Spoke with patient's son as interpreter.  Actions: * If pain score is 4 or above: No action needed, pain <4. Have you developed a fever since your procedure? o  2.   Have you had an respiratory symptoms (SOB or cough) since your procedure? no  3.   Have you tested positive for COVID 19 since your procedure no  4.   Have you had any family members/close contacts diagnosed with the COVID 19 since your procedure?  no   If yes to any of these questions please route to Joylene John, RN and Joella Prince, RN

## 2020-10-12 ENCOUNTER — Other Ambulatory Visit: Payer: Self-pay | Admitting: Pharmacist

## 2020-10-12 ENCOUNTER — Other Ambulatory Visit: Payer: Self-pay

## 2020-10-12 MED ORDER — ACCU-CHEK GUIDE VI STRP
ORAL_STRIP | 2 refills | Status: AC
  Filled 2020-10-12: qty 100, 25d supply, fill #0

## 2020-10-12 MED FILL — Rosuvastatin Calcium Tab 20 MG: ORAL | 30 days supply | Qty: 30 | Fill #0 | Status: AC

## 2020-10-12 MED FILL — Metformin HCl Tab ER 24HR 500 MG: ORAL | 30 days supply | Qty: 30 | Fill #1 | Status: AC

## 2020-10-12 MED FILL — Tiotropium Bromide Monohydrate Inhal Cap 18 MCG (Base Equiv): RESPIRATORY_TRACT | 30 days supply | Qty: 30 | Fill #3 | Status: AC

## 2020-10-13 ENCOUNTER — Other Ambulatory Visit: Payer: Self-pay

## 2020-11-13 ENCOUNTER — Other Ambulatory Visit: Payer: Self-pay

## 2020-11-13 MED FILL — Metformin HCl Tab ER 24HR 500 MG: ORAL | 30 days supply | Qty: 30 | Fill #2 | Status: AC

## 2020-11-13 MED FILL — Rosuvastatin Calcium Tab 20 MG: ORAL | 30 days supply | Qty: 30 | Fill #1 | Status: AC

## 2020-11-13 MED FILL — Tiotropium Bromide Monohydrate Inhal Cap 18 MCG (Base Equiv): RESPIRATORY_TRACT | 30 days supply | Qty: 30 | Fill #4 | Status: AC

## 2020-12-22 MED FILL — Metformin HCl Tab ER 24HR 500 MG: ORAL | 30 days supply | Qty: 30 | Fill #3 | Status: AC

## 2020-12-22 MED FILL — Rosuvastatin Calcium Tab 20 MG: ORAL | 30 days supply | Qty: 30 | Fill #2 | Status: AC

## 2020-12-23 ENCOUNTER — Other Ambulatory Visit: Payer: Self-pay

## 2020-12-24 ENCOUNTER — Other Ambulatory Visit: Payer: Self-pay

## 2021-01-16 MED FILL — Tiotropium Bromide Monohydrate Inhal Cap 18 MCG (Base Equiv): RESPIRATORY_TRACT | 30 days supply | Qty: 30 | Fill #5 | Status: AC

## 2021-01-18 ENCOUNTER — Other Ambulatory Visit: Payer: Self-pay

## 2021-01-20 ENCOUNTER — Other Ambulatory Visit: Payer: Self-pay

## 2021-02-01 ENCOUNTER — Other Ambulatory Visit: Payer: Self-pay

## 2021-02-01 MED FILL — Metformin HCl Tab ER 24HR 500 MG: ORAL | 30 days supply | Qty: 30 | Fill #4 | Status: AC

## 2021-02-01 MED FILL — Rosuvastatin Calcium Tab 20 MG: ORAL | 30 days supply | Qty: 30 | Fill #3 | Status: AC

## 2021-02-03 ENCOUNTER — Other Ambulatory Visit: Payer: Self-pay

## 2021-03-01 ENCOUNTER — Other Ambulatory Visit: Payer: Self-pay

## 2021-03-03 ENCOUNTER — Other Ambulatory Visit: Payer: Self-pay

## 2021-03-03 ENCOUNTER — Other Ambulatory Visit: Payer: Self-pay | Admitting: Family Medicine

## 2021-03-03 DIAGNOSIS — J449 Chronic obstructive pulmonary disease, unspecified: Secondary | ICD-10-CM

## 2021-03-03 MED FILL — Metformin HCl Tab ER 24HR 500 MG: ORAL | 30 days supply | Qty: 30 | Fill #5 | Status: AC

## 2021-03-03 MED FILL — Rosuvastatin Calcium Tab 20 MG: ORAL | 30 days supply | Qty: 30 | Fill #4 | Status: AC

## 2021-03-04 ENCOUNTER — Other Ambulatory Visit: Payer: Self-pay

## 2021-03-04 MED ORDER — SPIRIVA HANDIHALER 18 MCG IN CAPS
1.0000 | ORAL_CAPSULE | Freq: Every day | RESPIRATORY_TRACT | 0 refills | Status: DC
  Filled 2021-03-04: qty 30, 30d supply, fill #0

## 2021-03-05 ENCOUNTER — Other Ambulatory Visit: Payer: Self-pay

## 2021-04-17 ENCOUNTER — Other Ambulatory Visit: Payer: Self-pay | Admitting: Family Medicine

## 2021-04-17 DIAGNOSIS — J449 Chronic obstructive pulmonary disease, unspecified: Secondary | ICD-10-CM

## 2021-04-17 DIAGNOSIS — E119 Type 2 diabetes mellitus without complications: Secondary | ICD-10-CM

## 2021-04-17 MED FILL — Rosuvastatin Calcium Tab 20 MG: ORAL | 30 days supply | Qty: 30 | Fill #5 | Status: CN

## 2021-04-17 NOTE — Telephone Encounter (Signed)
Requested medication (s) are due for refill today: yes  Requested medication (s) are on the active medication list: yes  Last refill:  metformin: 05/27/20     Spriva:03/04/21 #30    Advair: 07/17/20 #60 5 RF  Future visit scheduled: yes Notes to clinic:  unable to refill -  Rx indicated pt must have office visit- appt is in 3 days please advise if NT could refill  Used Nepali interpreter Andria Frames [phone] Requested Prescriptions  Pending Prescriptions Disp Refills   metFORMIN (GLUCOPHAGE-XR) 500 MG 24 hr tablet 30 tablet 6    Sig: TAKE 1 TABLET (500 MG TOTAL) BY MOUTH DAILY WITH BREAKFAST.     Endocrinology:  Diabetes - Biguanides Failed - 04/17/2021 10:11 AM      Failed - HBA1C is between 0 and 7.9 and within 180 days    HbA1c, POC (controlled diabetic range)  Date Value Ref Range Status  08/24/2020 6.4 0.0 - 7.0 % Final          Failed - Valid encounter within last 6 months    Recent Outpatient Visits           7 months ago Controlled type 2 diabetes mellitus without complication, without long-term current use of insulin ([place])   [place], [place], MD   10 months ago Controlled type 2 diabetes mellitus without complication, without long-term current use of insulin ([place])   [place], [place], MD   1 year ago Controlled type 2 diabetes mellitus without complication, without long-term current use of insulin ([place])   [place], [place], MD   1 year ago Controlled type 2 diabetes mellitus without complication, without long-term current use of insulin ([place])   [place], [place], MD   2 years ago Controlled type 2 diabetes mellitus without complication, without long-term current use of insulin ([place])   [place], [place], MD       Future Appointments             In 3 days Gildardo Pounds, NP  [place] - Cr in normal range and within 360 days    Creat  Date Value Ref Range Status  07/21/2015 0.79 0.70 - 1.25 mg/dL Final   Creatinine, Ser  Date Value Ref Range Status  05/28/2020 0.85 0.76 - 1.27 mg/dL Final          Passed - eGFR in normal range and within 360 days    GFR, Est African American  Date Value Ref Range Status  07/21/2015 >89 >=60 mL/min Final   GFR calc Af Amer  Date Value Ref Range Status  11/28/2019 104 >59 mL/min/1.73 Final    Comment:    **[facility] currently reports eGFR in compliance with the current**   recommendations of the [employer]. [facility] will   update reporting as new guidelines are published from the NKF-ASN   Task force.    GFR, Est Non African American  Date Value Ref Range Status  07/21/2015 >89 >=60 mL/min Final    Comment:      The estimated GFR is a calculation valid for adults (>=67 years old) that uses the CKD-EPI algorithm to adjust for age and sex. It is   not to be used for children, pregnant women, hospitalized  patients,    patients on dialysis, or with rapidly changing kidney function. According to the NKDEP, eGFR >89 is normal, 60-89 shows mild impairment, 30-59 shows moderate impairment, 15-29 shows severe impairment and <15 is ESRD.      GFR calc non Af Amer  Date Value Ref Range Status  11/28/2019 90 >59 mL/min/1.73 Final   eGFR  Date Value Ref Range Status  05/28/2020 93 >59 mL/min/1.73 Final           tiotropium (SPIRIVA HANDIHALER) 18 MCG inhalation capsule 30 capsule 0    Sig: PLACE 1 CAPSULE (18 MCG TOTAL) INTO INHALER AND INHALE DAILY.     Pulmonology:  Anticholinergic Agents Passed - 04/17/2021 10:11 AM      Passed - Valid encounter within last 12 months    Recent Outpatient Visits           7 months ago Controlled type 2 diabetes mellitus without complication, without long-term current use of insulin ([place])   [place], [place], MD   10 months ago Controlled type 2 diabetes mellitus without complication, without long-term current use of insulin ([place])   [place], [place], MD   1 year ago Controlled type 2 diabetes mellitus without complication, without long-term current use of insulin ([place])   [place], [place], MD   1 year ago Controlled type 2 diabetes mellitus without complication, without long-term current use of insulin ([place])   [place], Charlane Ferretti, MD   2 years ago Controlled type 2 diabetes mellitus without complication, without long-term current use of insulin ([place])   [place], Charlane Ferretti, MD       Future Appointments             In 3 days Gildardo Pounds, NP [facility]             fluticasone-salmeterol (ADVAIR DISKUS) 250-50 MCG/ACT AEPB 60 each 5    Sig: Inhale 1 puff into the lungs 2 (two) times daily.     Pulmonology:  Combination Products Passed - 04/17/2021 10:11 AM      Passed - Valid encounter within last 12 months    Recent Outpatient Visits           7 months ago Controlled type 2 diabetes mellitus without complication, without long-term current use of insulin ([place])   [place], [place], MD   10 months ago Controlled type 2 diabetes mellitus without complication, without long-term current use of insulin ([place])   [place], [place], MD   1 year ago Controlled type 2 diabetes mellitus without complication, without long-term current use of insulin ([place])   [place], [place], MD   1 year ago Controlled type 2 diabetes mellitus without complication, without long-term current use of insulin ([place])   [place],  Charlane Ferretti, MD   2 years ago Controlled type 2 diabetes mellitus without complication, without long-term current use of insulin ([place])   [place], [place]ong, MD       Future Appointments             In 3 days Gildardo Pounds, NP [place]

## 2021-04-19 ENCOUNTER — Other Ambulatory Visit: Payer: Self-pay

## 2021-04-19 MED FILL — Rosuvastatin Calcium Tab 20 MG: ORAL | 30 days supply | Qty: 30 | Fill #0 | Status: AC

## 2021-04-20 ENCOUNTER — Other Ambulatory Visit: Payer: Self-pay

## 2021-04-20 ENCOUNTER — Encounter: Payer: Self-pay | Admitting: Nurse Practitioner

## 2021-04-20 ENCOUNTER — Ambulatory Visit: Payer: Medicare Other | Attending: Nurse Practitioner | Admitting: Nurse Practitioner

## 2021-04-20 DIAGNOSIS — E119 Type 2 diabetes mellitus without complications: Secondary | ICD-10-CM

## 2021-04-20 DIAGNOSIS — J449 Chronic obstructive pulmonary disease, unspecified: Secondary | ICD-10-CM

## 2021-04-20 MED ORDER — FLUTICASONE-SALMETEROL 500-50 MCG/ACT IN AEPB
1.0000 | INHALATION_SPRAY | Freq: Two times a day (BID) | RESPIRATORY_TRACT | 6 refills | Status: DC
  Filled 2021-04-20: qty 60, 30d supply, fill #0
  Filled 2021-08-06: qty 60, 30d supply, fill #1

## 2021-04-20 MED ORDER — SPIRIVA HANDIHALER 18 MCG IN CAPS
1.0000 | ORAL_CAPSULE | Freq: Every day | RESPIRATORY_TRACT | 6 refills | Status: DC
  Filled 2021-04-20: qty 30, 30d supply, fill #0
  Filled 2021-06-02: qty 30, 30d supply, fill #1

## 2021-04-20 MED ORDER — METFORMIN HCL ER 500 MG PO TB24
ORAL_TABLET | Freq: Every day | ORAL | 6 refills | Status: DC
  Filled 2021-04-20: qty 30, 30d supply, fill #0
  Filled 2021-06-02: qty 30, 30d supply, fill #1

## 2021-04-20 NOTE — Progress Notes (Signed)
Virtual Visit Note Due to national recommendations of social distancing due to [place] 19, virtual visit is felt to be most appropriate for this patient at this time.  I discussed the limitations, risks, security and privacy concerns of performing an evaluation and management service by video and the availability of in person appointments. I also discussed with the patient that there may be a patient responsible charge related to this service. The patient expressed understanding and agreed to proceed.    I connected with David Barber on 04/20/21  at   9:50 AM EST  EDT by VIDEO and verified that I am speaking with the correct person using two identifiers.   Location of Patient: Private Residence   Location of Provider: [facility] participating in VIRTUAL visit: Geryl Rankins FNP-BC [place]    History of Present Illness: VIRTUAL visit for: Medication Refill His son is interpreting for him today.  He has a history of COPD, DM 2, HPL.   DM 2 Diabetes is well controlled with metformin XR 500 mg daily. Denies any symptoms of hypoglycemia. Reports normal blood glucose readings fasting and post prandial.  Lab Results  Component Value Date   HGBA1C 6.4 08/24/2020    COPD Well controlled with prn Albuterol and daily Advair and Spiriva. He denies cough, wheezing or worsening shortness of breath.    Past Medical History:  Diagnosis Date   Allergy    Anxiety    Asthma    Bronchitis    Cataract    COPD (chronic obstructive pulmonary disease) ([place])    Diabetes mellitus without complication (HCC)    GERD (gastroesophageal reflux disease)    Hyperlipidemia    Hypertension     Past Surgical History:  Procedure Laterality Date   COLONOSCOPY     CYST REMOVAL NECK  03/22/2011   benign   POLYPECTOMY     UPPER GASTROINTESTINAL ENDOSCOPY      Family History  Problem Relation Age of Onset   Colon cancer Neg Hx    Colon  polyps Neg Hx    Esophageal cancer Neg Hx    Rectal cancer Neg Hx    Stomach cancer Neg Hx     Social History   Socioeconomic History   Marital status: Married    Spouse name: Not on file   Number of children: Not on file   Years of education: Not on file   Highest education level: Not on file  Occupational History   Not on file  Tobacco Use   Smoking status: Former    Types: Cigarettes    Quit date: 10/19/2013    Years since quitting: 7.5   Smokeless tobacco: Current    Types: Chew   Tobacco comments:    chewing tobacco  Vaping Use   Vaping Use: Never used  Substance and Sexual Activity   Alcohol use: Not Currently   Drug use: No   Sexual activity: Not on file  Other Topics Concern   Not on file  Social History Narrative   Not on file   Social Determinants of Health   Financial Resource Strain: Not on file  Food Insecurity: Not on file  Transportation Needs: Not on file  Physical Activity: Not on file  Stress: Not on file  Social Connections: Not on file     Observations/Objective: Awake, alert and oriented x 3   ROS  Assessment and Plan: Diagnoses and all orders for this visit:  Controlled type 2 diabetes mellitus without complication, without long-term current use of insulin (HCC) -     metFORMIN (GLUCOPHAGE-XR) 500 MG 24 hr tablet; TAKE 1 TABLET (500 MG TOTAL) BY MOUTH DAILY WITH BREAKFAST. Continue blood sugar control as discussed in office today, low carbohydrate diet, and regular physical exercise as tolerated, 150 minutes per week (30 min each day, 5 days per week, or 50 min 3 days per week). Keep blood sugar logs with fasting goal of 90-130 mg/dl, post prandial (after you eat) less than 180.  For Hypoglycemia: BS <60 and Hyperglycemia BS >400; contact the clinic ASAP. Annual eye exams and foot exams are recommended.   COPD, mild (HCC) -     fluticasone-salmeterol (ADVAIR DISKUS) 500-50 MCG/ACT AEPB; Inhale 1 puff into the lungs in the morning and at  bedtime. -     tiotropium (SPIRIVA HANDIHALER) 18 MCG inhalation capsule; PLACE 1 CAPSULE (18 MCG TOTAL) INTO INHALER AND INHALE DAILY.     Follow Up Instructions Return if symptoms worsen or fail to improve.     I discussed the assessment and treatment plan with the patient. The patient was provided an opportunity to ask questions and all were answered. The patient agreed with the plan and demonstrated an understanding of the instructions.   The patient was advised to call back or seek an in-person evaluation if the symptoms worsen or if the condition fails to improve as anticipated.  I provided 12 minutes of face-to-face time during this encounter including median intraservice time, reviewing previous notes, labs, imaging, medications and explaining diagnosis and management.  Gildardo Pounds, FNP-BC

## 2021-04-22 ENCOUNTER — Other Ambulatory Visit: Payer: Self-pay

## 2021-04-23 ENCOUNTER — Other Ambulatory Visit: Payer: Self-pay

## 2021-05-30 IMAGING — DX DG CHEST 2V
2 series · 2 of 2 positions shown · non-contrast
Comparison: 01/22/2019

CLINICAL DATA: Fever cough chills.

EXAM:
CHEST - 2 VIEW

[chest pa]
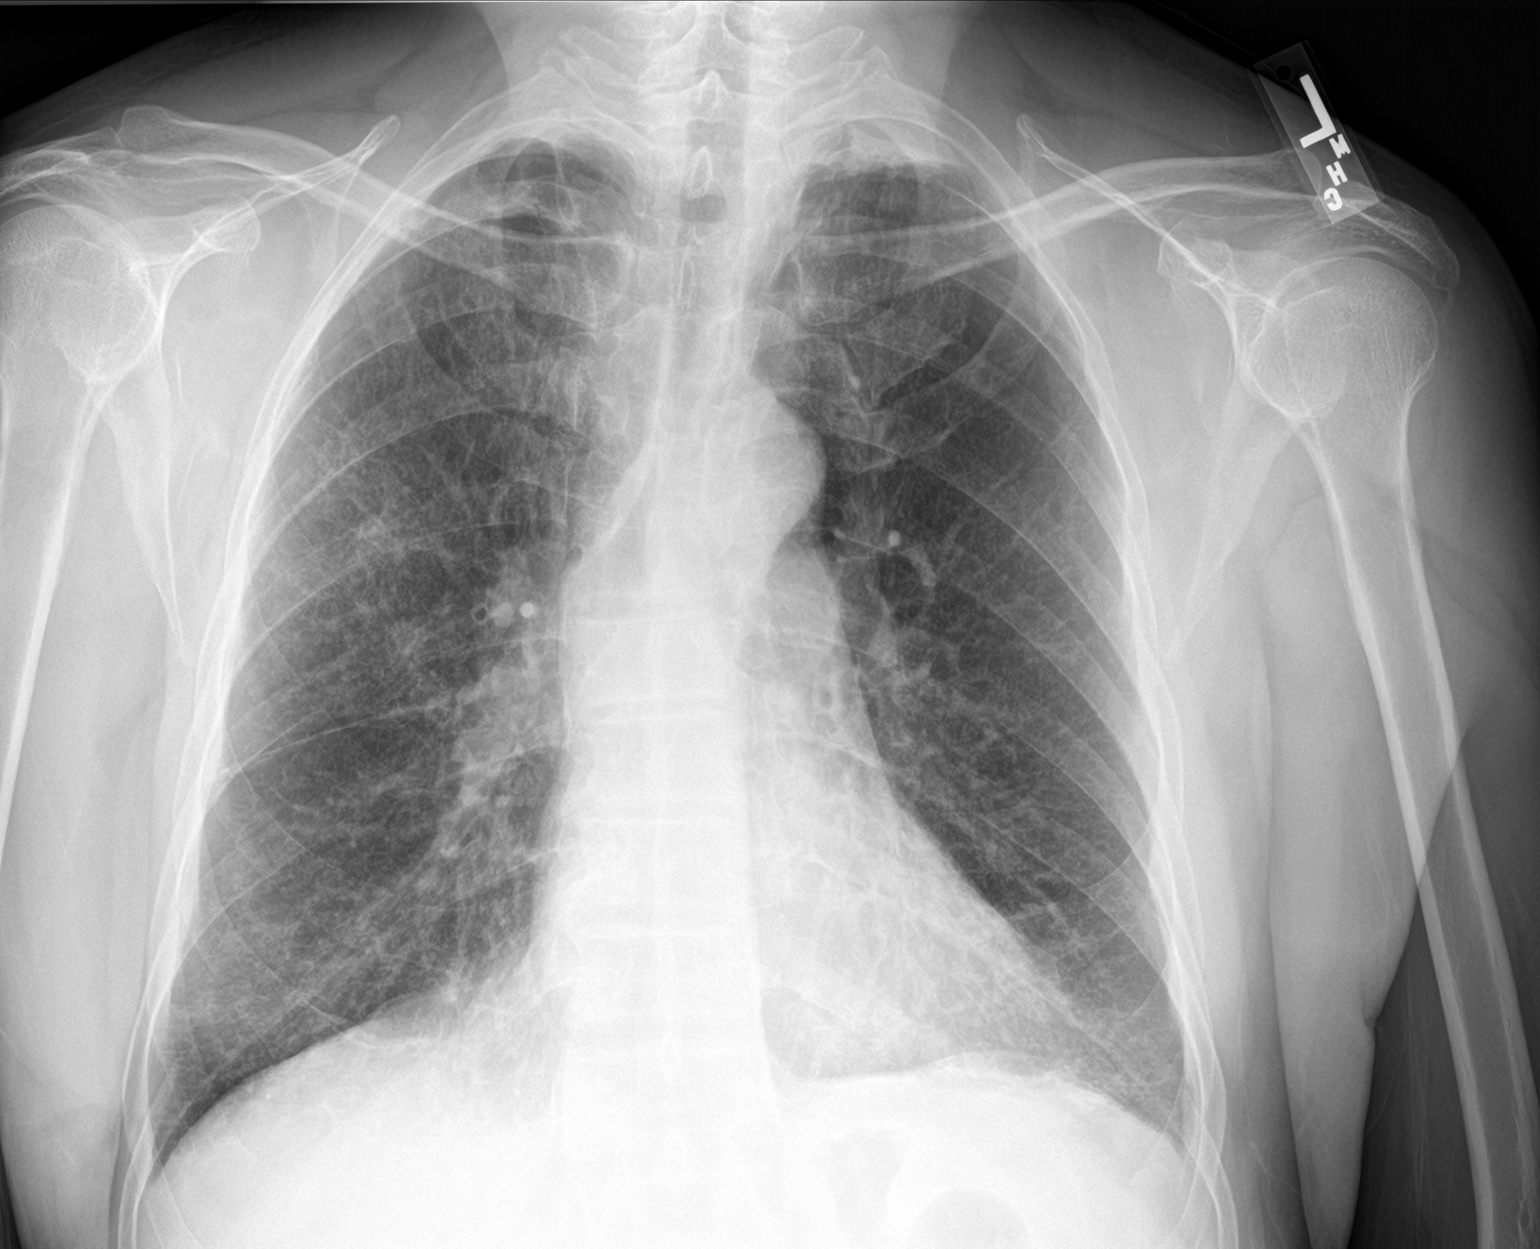

[chest lat]
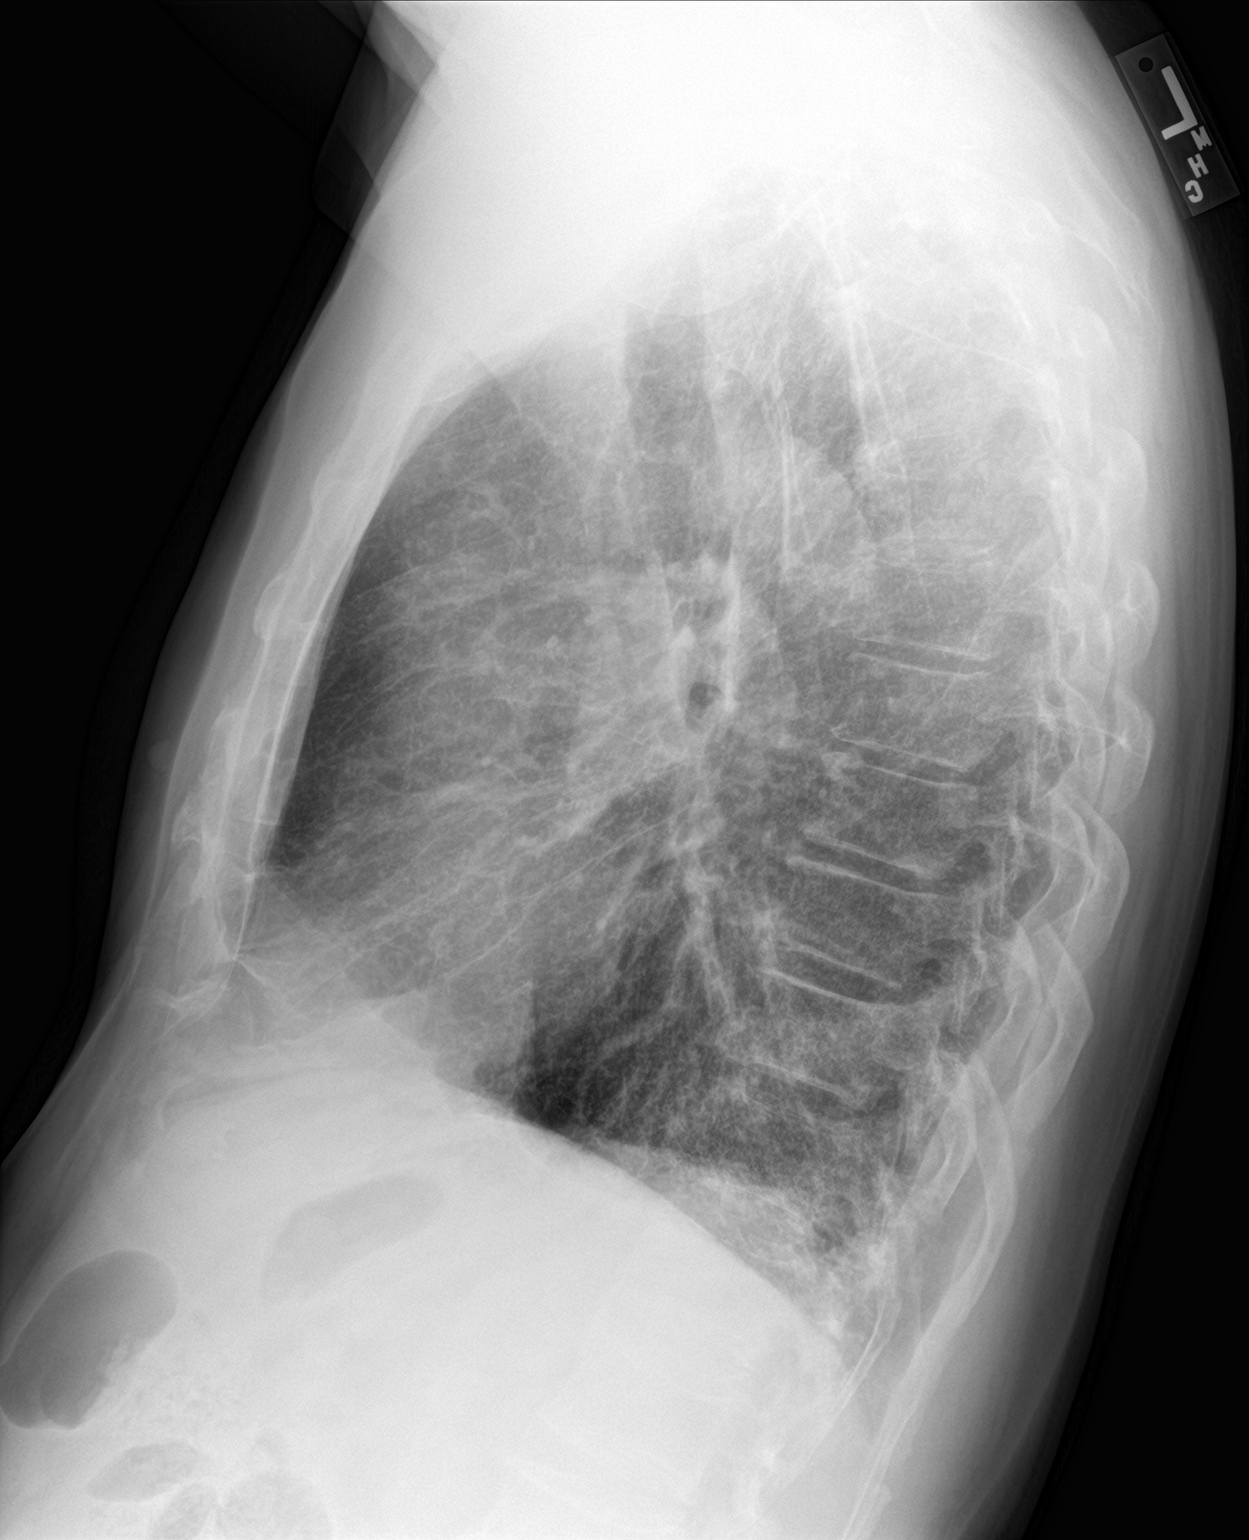

[2 of 2 positions shown; findings below may reference images not displayed]

FINDINGS: COPD with hyperinflation. Mild scarring in the bases left greater
than right. Patchy density in the right mid lung unchanged
compatible with scarring. Apical scarring bilaterally. Interval
resolution of left lower lobe infiltrate on the prior study.

Heart size and vascularity normal.  Negative for pleural effusion.
IMPRESSION: COPD with scarring.  No acute abnormality.

## 2021-06-02 ENCOUNTER — Other Ambulatory Visit: Payer: Self-pay

## 2021-06-03 ENCOUNTER — Other Ambulatory Visit: Payer: Self-pay

## 2021-06-16 ENCOUNTER — Ambulatory Visit: Payer: Medicare Other | Attending: Family Medicine | Admitting: Family Medicine

## 2021-06-16 ENCOUNTER — Encounter: Payer: Self-pay | Admitting: Family Medicine

## 2021-06-16 VITALS — BP 124/79 | HR 110 | Ht 65.0 in | Wt 167.2 lb

## 2021-06-16 DIAGNOSIS — E119 Type 2 diabetes mellitus without complications: Secondary | ICD-10-CM

## 2021-06-16 DIAGNOSIS — J449 Chronic obstructive pulmonary disease, unspecified: Secondary | ICD-10-CM | POA: Insufficient documentation

## 2021-06-16 DIAGNOSIS — F17220 Nicotine dependence, chewing tobacco, uncomplicated: Secondary | ICD-10-CM | POA: Diagnosis not present

## 2021-06-16 DIAGNOSIS — Z79899 Other long term (current) drug therapy: Secondary | ICD-10-CM | POA: Diagnosis not present

## 2021-06-16 DIAGNOSIS — E1149 Type 2 diabetes mellitus with other diabetic neurological complication: Secondary | ICD-10-CM

## 2021-06-16 DIAGNOSIS — K5900 Constipation, unspecified: Secondary | ICD-10-CM | POA: Diagnosis not present

## 2021-06-16 DIAGNOSIS — G4709 Other insomnia: Secondary | ICD-10-CM | POA: Diagnosis not present

## 2021-06-16 DIAGNOSIS — E1169 Type 2 diabetes mellitus with other specified complication: Secondary | ICD-10-CM | POA: Diagnosis not present

## 2021-06-16 DIAGNOSIS — E1140 Type 2 diabetes mellitus with diabetic neuropathy, unspecified: Secondary | ICD-10-CM | POA: Insufficient documentation

## 2021-06-16 DIAGNOSIS — E785 Hyperlipidemia, unspecified: Secondary | ICD-10-CM | POA: Diagnosis not present

## 2021-06-16 DIAGNOSIS — Z8616 Personal history of COVID-19: Secondary | ICD-10-CM | POA: Insufficient documentation

## 2021-06-16 DIAGNOSIS — Z7984 Long term (current) use of oral hypoglycemic drugs: Secondary | ICD-10-CM | POA: Diagnosis not present

## 2021-06-16 DIAGNOSIS — K5909 Other constipation: Secondary | ICD-10-CM

## 2021-06-16 LAB — POCT GLYCOSYLATED HEMOGLOBIN (HGB A1C): HbA1c, POC (controlled diabetic range): 6.2 % (ref 0.0–7.0)

## 2021-06-16 LAB — GLUCOSE, POCT (MANUAL RESULT ENTRY): POC Glucose: 112 mg/dl — AB (ref 70–99)

## 2021-06-16 MED ORDER — ROSUVASTATIN CALCIUM 20 MG PO TABS
ORAL_TABLET | Freq: Every day | ORAL | 6 refills | Status: DC
  Filled 2021-06-16: qty 30, 30d supply, fill #0
  Filled 2021-08-25: qty 30, 30d supply, fill #1
  Filled 2021-10-11: qty 30, 30d supply, fill #2

## 2021-06-16 MED ORDER — METFORMIN HCL ER 500 MG PO TB24
500.0000 mg | ORAL_TABLET | Freq: Every day | ORAL | 6 refills | Status: DC
  Filled 2021-06-16: qty 30, fill #0
  Filled 2021-07-12: qty 30, 30d supply, fill #0

## 2021-06-16 MED ORDER — POLYETHYLENE GLYCOL 3350 17 G PO PACK
17.0000 g | PACK | Freq: Every day | ORAL | 6 refills | Status: DC
  Filled 2021-06-16: qty 30, 30d supply, fill #0

## 2021-06-16 MED ORDER — GABAPENTIN 300 MG PO CAPS
ORAL_CAPSULE | Freq: Every day | ORAL | 6 refills | Status: DC
  Filled 2021-06-16: qty 30, 30d supply, fill #0
  Filled 2021-08-25: qty 30, 30d supply, fill #1
  Filled 2021-10-11: qty 30, 30d supply, fill #2

## 2021-06-16 MED ORDER — SPIRIVA HANDIHALER 18 MCG IN CAPS
1.0000 | ORAL_CAPSULE | Freq: Every day | RESPIRATORY_TRACT | 6 refills | Status: DC
  Filled 2021-06-16 – 2021-08-06 (×2): qty 30, 30d supply, fill #0
  Filled 2021-10-11: qty 30, 30d supply, fill #1

## 2021-06-16 MED ORDER — HYDROXYZINE HCL 25 MG PO TABS
25.0000 mg | ORAL_TABLET | Freq: Every evening | ORAL | 0 refills | Status: DC | PRN
Start: 2021-06-16 — End: 2021-11-24
  Filled 2021-06-16 – 2021-06-17 (×2): qty 30, 30d supply, fill #0

## 2021-06-16 NOTE — Patient Instructions (Signed)

## 2021-06-16 NOTE — Progress Notes (Signed)
Not sleeping ?Constipation. ?

## 2021-06-16 NOTE — Progress Notes (Signed)
? ?Subjective:  ?Patient ID: David Barber, male    DOB: [birth year]  Age: 71 y.o. MRN: [id number] ? ?CC: Diabetes ? ? ?HPI ?David Barber is a 71 y.o. year old male with a history of COPD, type 2 diabetes mellitus (A1c 6.2), hyperlipidemia COPD,  here for chronic disease management.tobacco use (chews tobacco), previous history of COVID  ? ?Interval History: ? ?He complains of constipation and moves his bowels every 5 days but sometimes has to perform rectal stimulation with associated straining. He has to use milk and Juice to move his bowels. ? ?He also has insomnia. Goes to bed at 9am, falls asleep at midnight and wakes up at 4am. Denies caffeine intake but endorses taking daytime naps. ? ?COPD is controlled on his inhalers and he denies any exacerbation.  Continues to chew tobacco. ?Diabetes is controlled with no episodes of hypoglycemia.  Neuropathy is controlled on gabapentin and he has no blurry vision.  He is on a statin. ?Past Medical History:  ?Diagnosis Date  ? Allergy   ? Anxiety   ? Asthma   ? Bronchitis   ? Cataract   ? COPD (chronic obstructive pulmonary disease) ([place])   ? Diabetes mellitus without complication ([place])   ? GERD (gastroesophageal reflux disease)   ? Hyperlipidemia   ? Hypertension   ? ? ?Past Surgical History:  ?Procedure Laterality Date  ? COLONOSCOPY    ? CYST REMOVAL NECK  03/22/2011  ? benign  ? POLYPECTOMY    ? UPPER GASTROINTESTINAL ENDOSCOPY    ? ? ?Family History  ?Problem Relation Age of Onset  ? Colon cancer Neg Hx   ? Colon polyps Neg Hx   ? Esophageal cancer Neg Hx   ? Rectal cancer Neg Hx   ? Stomach cancer Neg Hx   ? ? ?Social History  ? ?Socioeconomic History  ? Marital status: Married  ?  Spouse name: Not on file  ? Number of children: Not on file  ? Years of education: Not on file  ? Highest education level: Not on file  ?Occupational History  ? Not on file  ?Tobacco Use  ? Smoking status: Former  ?  Types: Cigarettes  ?  Quit date: 10/19/2013  ?  Years since  quitting: 7.6  ? Smokeless tobacco: Current  ?  Types: Chew  ? Tobacco comments:  ?  chewing tobacco  ?Vaping Use  ? Vaping Use: Never used  ?Substance and Sexual Activity  ? Alcohol use: Not Currently  ? Drug use: No  ? Sexual activity: Not on file  ?Other Topics Concern  ? Not on file  ?Social History Narrative  ? Not on file  ? ?Social Determinants of Health  ? ?Financial Resource Strain: Not on file  ?Food Insecurity: Not on file  ?Transportation Needs: Not on file  ?Physical Activity: Not on file  ?Stress: Not on file  ?Social Connections: Not on file  ? ? ?No Known Allergies ? ?Outpatient Medications Prior to Visit  ?Medication Sig Dispense Refill  ? aspirin EC 81 MG tablet Take 1 tablet (81 mg total) by mouth daily. 30 tablet 5  ? buPROPion (WELLBUTRIN SR) 150 MG 12 hr tablet Take 1 tablet (150 mg total) by mouth 2 (two) times daily. 60 tablet 3  ? cetirizine (ZYRTEC) 10 MG tablet Take 1 tablet (10 mg total) by mouth daily. 30 tablet 11  ? glucose blood (ACCU-CHEK GUIDE) test strip Check blood sugar once a day. 100 each 2  ?  ketotifen (ZADITOR) 0.025 % ophthalmic solution Place 1 drop into both eyes 2 (two) times daily. 5 mL 1  ? triamcinolone cream (KENALOG) 0.1 % Apply 1 application topically 2 (two) times daily. 45 g 1  ? docusate sodium (COLACE) 100 MG capsule Take 1 capsule (100 mg total) by mouth 2 (two) times daily. 60 capsule 0  ? hydrOXYzine (ATARAX/VISTARIL) 25 MG tablet Take 1 tablet (25 mg total) by mouth every 8 (eight) hours as needed for itching. 90 tablet 0  ? metFORMIN (GLUCOPHAGE-XR) 500 MG 24 hr tablet TAKE 1 TABLET (500 MG TOTAL) BY MOUTH DAILY WITH BREAKFAST. 30 tablet 6  ? permethrin (ELIMITE) 5 % cream Apply 1 application topically once.    ? polyethylene glycol (MIRALAX / GLYCOLAX) 17 g packet Take 17 g by mouth daily. 14 each 0  ? tiotropium (SPIRIVA HANDIHALER) 18 MCG inhalation capsule PLACE 1 CAPSULE (18 MCG TOTAL) INTO INHALER AND INHALE DAILY. 30 capsule 6  ? albuterol (VENTOLIN  HFA) 108 (90 Base) MCG/ACT inhaler INHALE 2 PUFFS INTO THE LUNGS EVERY 6 (SIX) HOURS AS NEEDED FOR WHEEZING OR SHORTNESS OF BREATH. 18 g 6  ? fluticasone-salmeterol (ADVAIR DISKUS) 500-50 MCG/ACT AEPB Inhale 1 puff into the lungs in the morning and at bedtime. 60 each 6  ? gabapentin (NEURONTIN) 300 MG capsule TAKE 1 CAPSULE (300 MG TOTAL) BY MOUTH AT BEDTIME. 30 capsule 6  ? rosuvastatin (CRESTOR) 20 MG tablet TAKE 1 TABLET (20 MG TOTAL) BY MOUTH DAILY. 30 tablet 6  ? ?Facility-Administered Medications Prior to Visit  ?Medication Dose Route Frequency Provider Last Rate Last Admin  ? 0.9 %  sodium chloride infusion  500 mL Intravenous Once Irene Shipper, MD      ? ? ? ?ROS ?Review of Systems  ?Constitutional:  Negative for activity change and appetite change.  ?HENT:  Negative for sinus pressure and sore throat.   ?Eyes:  Negative for visual disturbance.  ?Respiratory:  Negative for cough, chest tightness and shortness of breath.   ?Cardiovascular:  Negative for chest pain and leg swelling.  ?Gastrointestinal:  Positive for constipation. Negative for abdominal distention, abdominal pain and diarrhea.  ?Endocrine: Negative.   ?Genitourinary:  Negative for dysuria.  ?Musculoskeletal:  Negative for joint swelling and myalgias.  ?Skin:  Negative for rash.  ?Allergic/Immunologic: Negative.   ?Neurological:  Negative for weakness, light-headedness and numbness.  ?Psychiatric/Behavioral:  Positive for sleep disturbance. Negative for dysphoric mood and suicidal ideas.   ? ?Objective:  ?BP 124/79   Pulse (!) 110   Ht '5\' 5"'$  (1.651 m)   Wt 167 lb 3.2 oz (75.8 kg)   SpO2 94%   BMI 27.82 kg/m?  ? ? ?  06/16/2021  ?  4:13 PM 10/06/2020  ?  4:27 PM 10/06/2020  ?  4:17 PM  ?BP/Weight  ?Systolic BP 937 169 88  ?Diastolic BP 79 53 47  ?Wt. (Lbs) 167.2    ?BMI 27.82 kg/m2    ? ? ? ? ?Physical Exam ?Constitutional:   ?   Appearance: He is well-developed.  ?Cardiovascular:  ?   Rate and Rhythm: Tachycardia present.  ?   Heart sounds:  Normal heart sounds. No murmur heard. ?Pulmonary:  ?   Effort: Pulmonary effort is normal.  ?   Breath sounds: Normal breath sounds. No wheezing or rales.  ?Chest:  ?   Chest wall: No tenderness.  ?Abdominal:  ?   General: Bowel sounds are normal. There is no distension.  ?  Palpations: Abdomen is soft. There is no mass.  ?   Tenderness: There is no abdominal tenderness.  ?Musculoskeletal:     ?   General: Normal range of motion.  ?   Right lower leg: No edema.  ?   Left lower leg: No edema.  ?Neurological:  ?   Mental Status: He is alert and oriented to person, place, and time.  ?Psychiatric:     ?   Mood and Affect: Mood normal.  ? ?Diabetic Foot Exam - Simple   ?Simple Foot Form ?Diabetic Foot exam was performed with the following findings: Yes 06/16/2021  5:02 PM  ?Visual Inspection ?No deformities, no ulcerations, no other skin breakdown bilaterally: Yes ?Sensation Testing ?Intact to touch and monofilament testing bilaterally: Yes ?Pulse Check ?Posterior Tibialis and Dorsalis pulse intact bilaterally: Yes ?Comments ?  ? ? ? ? ?  Latest Ref Rng & Units 05/28/2020  ?  9:56 AM 11/28/2019  ? 10:41 AM 05/30/2019  ?  8:36 AM  ?CMP  ?Glucose 65 - 99 mg/dL 97   94   98    ?BUN 8 - 27 mg/dL '8   7   8    '$ ?Creatinine 0.76 - 1.27 mg/dL 0.85   0.81   0.82    ?Sodium 134 - 144 mmol/L 144   142   142    ?Potassium 3.5 - 5.2 mmol/L 5.2   4.6   4.5    ?Chloride 96 - 106 mmol/L 104   103   104    ?CO2 20 - 29 mmol/L '25   27   22    '$ ?Calcium 8.6 - 10.2 mg/dL 10.0   9.6   9.6    ?Total Protein 6.0 - 8.5 g/dL 7.4   8.0   7.1    ?Total Bilirubin 0.0 - 1.2 mg/dL 0.6   0.5   0.3    ?Alkaline Phos 44 - 121 IU/L 122   134   111    ?AST 0 - 40 IU/L '23   19   19    '$ ?ALT 0 - 44 IU/L '23   18   16    '$ ? ? ?Lipid Panel  ?   ?Component Value Date/Time  ? CHOL 242 (H) 05/28/2020 0956  ? TRIG 150 (H) 05/28/2020 0956  ? HDL 42 05/28/2020 0956  ? CHOLHDL 5.8 (H) 05/28/2020 0956  ? CHOLHDL 6.5 (H) 07/21/2015 1625  ? VLDL 61 (H) 07/21/2015 1625  ?  LDLCALC 172 (H) 05/28/2020 0956  ? ? ?CBC ?   ?Component Value Date/Time  ? WBC 8.0 01/23/2019 0140  ? RBC 5.28 01/23/2019 0140  ? HGB 14.3 01/23/2019 0140  ? HCT 44.6 01/23/2019 0140  ? PLT 149 (L) 01/23/2019 0140

## 2021-06-17 ENCOUNTER — Other Ambulatory Visit: Payer: Self-pay

## 2021-06-17 LAB — CMP14+EGFR
ALT: 25 IU/L (ref 0–44)
AST: 24 IU/L (ref 0–40)
Albumin/Globulin Ratio: 1.6 (ref 1.2–2.2)
Albumin: 4.4 g/dL (ref 3.7–4.7)
Alkaline Phosphatase: 117 IU/L (ref 44–121)
BUN/Creatinine Ratio: 10 (ref 10–24)
BUN: 8 mg/dL (ref 8–27)
Bilirubin Total: 0.4 mg/dL (ref 0.0–1.2)
CO2: 25 mmol/L (ref 20–29)
Calcium: 9.7 mg/dL (ref 8.6–10.2)
Chloride: 105 mmol/L (ref 96–106)
Creatinine, Ser: 0.81 mg/dL (ref 0.76–1.27)
Globulin, Total: 2.7 g/dL (ref 1.5–4.5)
Glucose: 94 mg/dL (ref 70–99)
Potassium: 4.4 mmol/L (ref 3.5–5.2)
Sodium: 143 mmol/L (ref 134–144)
Total Protein: 7.1 g/dL (ref 6.0–8.5)
eGFR: 94 mL/min/{1.73_m2} (ref >59)

## 2021-07-12 ENCOUNTER — Other Ambulatory Visit (HOSPITAL_COMMUNITY): Payer: Self-pay

## 2021-07-16 ENCOUNTER — Other Ambulatory Visit: Payer: Self-pay

## 2021-07-16 ENCOUNTER — Other Ambulatory Visit (HOSPITAL_COMMUNITY): Payer: Self-pay

## 2021-08-05 ENCOUNTER — Encounter: Payer: Self-pay | Admitting: Physician Assistant

## 2021-08-05 ENCOUNTER — Ambulatory Visit: Payer: Medicare Other | Attending: Physician Assistant | Admitting: Physician Assistant

## 2021-08-05 ENCOUNTER — Other Ambulatory Visit: Payer: Self-pay

## 2021-08-05 VITALS — BP 121/83 | HR 98 | Temp 97.9°F | Ht 65.0 in | Wt 163.6 lb

## 2021-08-05 DIAGNOSIS — Z7982 Long term (current) use of aspirin: Secondary | ICD-10-CM | POA: Diagnosis not present

## 2021-08-05 DIAGNOSIS — J449 Chronic obstructive pulmonary disease, unspecified: Secondary | ICD-10-CM | POA: Insufficient documentation

## 2021-08-05 DIAGNOSIS — Z79899 Other long term (current) drug therapy: Secondary | ICD-10-CM | POA: Insufficient documentation

## 2021-08-05 DIAGNOSIS — Z7984 Long term (current) use of oral hypoglycemic drugs: Secondary | ICD-10-CM | POA: Insufficient documentation

## 2021-08-05 DIAGNOSIS — Z789 Other specified health status: Secondary | ICD-10-CM

## 2021-08-05 DIAGNOSIS — F419 Anxiety disorder, unspecified: Secondary | ICD-10-CM | POA: Diagnosis not present

## 2021-08-05 DIAGNOSIS — I10 Essential (primary) hypertension: Secondary | ICD-10-CM | POA: Diagnosis not present

## 2021-08-05 DIAGNOSIS — J069 Acute upper respiratory infection, unspecified: Secondary | ICD-10-CM | POA: Diagnosis not present

## 2021-08-05 DIAGNOSIS — E119 Type 2 diabetes mellitus without complications: Secondary | ICD-10-CM

## 2021-08-05 DIAGNOSIS — J029 Acute pharyngitis, unspecified: Secondary | ICD-10-CM | POA: Diagnosis present

## 2021-08-05 DIAGNOSIS — Z603 Acculturation difficulty: Secondary | ICD-10-CM

## 2021-08-05 LAB — GLUCOSE, POCT (MANUAL RESULT ENTRY): POC Glucose: 102 mg/dl — AB (ref 70–99)

## 2021-08-05 MED ORDER — BENZONATATE 100 MG PO CAPS
200.0000 mg | ORAL_CAPSULE | Freq: Three times a day (TID) | ORAL | 0 refills | Status: DC | PRN
  Filled 2021-08-05: qty 40, 7d supply, fill #0

## 2021-08-05 MED ORDER — PREDNISONE 10 MG PO TABS
ORAL_TABLET | ORAL | 0 refills | Status: DC
  Filled 2021-08-05: qty 21, 6d supply, fill #0

## 2021-08-05 MED ORDER — AZITHROMYCIN 250 MG PO TABS
ORAL_TABLET | ORAL | 0 refills | Status: AC
Start: 2021-08-05 — End: 2021-08-10
  Filled 2021-08-05: qty 6, 5d supply, fill #0

## 2021-08-05 NOTE — Progress Notes (Signed)
Patient ID: David Barber, male   DOB: 20-May-1950, 71 y.o.   MRN: [id number]   David Barber, is a 71 y.o. male  [id number]  [id number]  DOB - Feb 06, 1951  Chief Complaint  Patient presents with   Pain    With swallowing and fever for 2 days       Subjective:   David Barber is a 71 y.o. male here today for ST, cough and congestion.  Pain when swallowing.  Subjective temp but hasn't checked temperature.  Cough with yellowish mucus.  Going on for about 1 week.  No body aches or sneezing.     No problems updated.  ALLERGIES: No Known Allergies  PAST MEDICAL HISTORY: Past Medical History:  Diagnosis Date   Allergy    Anxiety    Asthma    Bronchitis    Cataract    COPD (chronic obstructive pulmonary disease) ([place])    Diabetes mellitus without complication (HCC)    GERD (gastroesophageal reflux disease)    Hyperlipidemia    Hypertension     MEDICATIONS AT HOME: Prior to Admission medications   Medication Sig Start Date End Date Taking? Authorizing Provider  aspirin EC 81 MG tablet Take 1 tablet (81 mg total) by mouth daily. 11/15/17  Yes Freeman Caldron M, PA-C  azithromycin (ZITHROMAX) 250 MG tablet Take 2 tablets on day 1, then 1 tablet daily on days 2 through 5 08/05/21 08/10/21 Yes Ladon Vandenberghe, Dionne Bucy, PA-C  benzonatate (TESSALON) 100 MG capsule Take 2 capsules (200 mg total) by mouth 3 (three) times daily as needed. For cough 08/05/21  Yes Elyzabeth Goatley M, PA-C  buPROPion [facility] SR) 150 MG 12 hr tablet Take 1 tablet (150 mg total) by mouth 2 (two) times daily. 11/28/19  Yes Charlott Rakes, MD  cetirizine (ZYRTEC) 10 MG tablet Take 1 tablet (10 mg total) by mouth daily. 08/16/18  Yes Janice Seales, Dionne Bucy, PA-C  gabapentin (NEURONTIN) 300 MG capsule TAKE 1 CAPSULE (300 MG TOTAL) BY MOUTH AT BEDTIME. 06/16/21 06/16/22 Yes Newlin, Enobong, MD  glucose blood (ACCU-CHEK GUIDE) test strip Check blood sugar once a day. 10/12/20  Yes Charlott Rakes, MD  hydrOXYzine (ATARAX) 25  MG tablet Take 1 tablet (25 mg total) by mouth at bedtime as needed. For insomnia 06/16/21  Yes Charlott Rakes, MD  ketotifen (ZADITOR) 0.025 % ophthalmic solution Place 1 drop into both eyes 2 (two) times daily. 02/15/17  Yes Rhemi Balbach M, PA-C  metFORMIN (GLUCOPHAGE-XR) 500 MG 24 hr tablet Take 1 tablet (500 mg total) by mouth daily with breakfast. 06/16/21  Yes Newlin, Enobong, MD  polyethylene glycol (MIRALAX / GLYCOLAX) 17 g packet Take 17 g by mouth daily. 06/16/21  Yes Charlott Rakes, MD  predniSONE (DELTASONE) 10 MG tablet 6,5,4,3,2,1 take each days dose in the morning with food 08/05/21  Yes Ludene Stokke M, PA-C  rosuvastatin (CRESTOR) 20 MG tablet TAKE 1 TABLET (20 MG TOTAL) BY MOUTH DAILY. 06/16/21 06/16/22 Yes Newlin, Charlane Ferretti, MD  tiotropium (SPIRIVA HANDIHALER) 18 MCG inhalation capsule PLACE 1 CAPSULE (18 MCG TOTAL) INTO INHALER AND INHALE DAILY. 06/16/21  Yes Charlott Rakes, MD  triamcinolone cream (KENALOG) 0.1 % Apply 1 application topically 2 (two) times daily. 04/30/18  Yes Newlin, Enobong, MD  albuterol (VENTOLIN HFA) 108 (90 Base) MCG/ACT inhaler INHALE 2 PUFFS INTO THE LUNGS EVERY 6 (SIX) HOURS AS NEEDED FOR WHEEZING OR SHORTNESS OF BREATH. 05/27/20 05/27/21  Charlott Rakes, MD  fluticasone-salmeterol (ADVAIR DISKUS) 500-50 MCG/ACT AEPB Inhale 1 puff into the  lungs in the morning and at bedtime. 04/20/21 05/23/21  Gildardo Pounds, NP  atorvastatin (LIPITOR) 80 MG tablet Take 1 tablet (80 mg total) by mouth daily. 05/27/20 05/29/20  Charlott Rakes, MD    ROS: Neg cardiac Neg GI Neg GU Neg MS Neg psych Neg neuro  Objective:   Vitals:   08/05/21 1042  BP: 121/83  Pulse: 98  Temp: 97.9 F (36.6 C)  TempSrc: Oral  SpO2: 93%  Weight: 163 lb 9.6 oz (74.2 kg)  Height: '5\' 5"'$  (1.651 m)   Exam General appearance : Awake, alert, not in any distress. Speech Clear. Not toxic looking HEENT: Atraumatic and Normocephalic, TM full B, throat with erythema, uvula midline, no  exudate, mild edema pf pharyngeal columns Neck: Supple, no JVD. No cervical lymphadenopathy.  Chest: Good air entry bilaterally, CTAB.  No rales/rhonchi/wheezing CVS: S1 S2 regular, no murmurs.  Extremities: B/L Lower Ext shows no edema, both legs are warm to touch Neurology: Awake alert, and oriented X 3, CN II-XII intact, Non focal Skin: No Rash  Data Review Lab Results  Component Value Date   HGBA1C 6.2 06/16/2021   HGBA1C 6.4 08/24/2020   HGBA1C 6.1 05/27/2020    Assessment & Plan   1. Upper respiratory tract infection, unspecified type Will cover for atypicals and prednisone for throat pain and swelloing.  Glucose 102 today - predniSONE (DELTASONE) 10 MG tablet; 6,5,4,3,2,1 take each days dose in the morning with food  Dispense: 21 tablet; Refill: 0 - azithromycin (ZITHROMAX) 250 MG tablet; Take 2 tablets on day 1, then 1 tablet daily on days 2 through 5  Dispense: 6 tablet; Refill: 0 - benzonatate (TESSALON) 100 MG capsule; Take 2 capsules (200 mg total) by mouth 3 (three) times daily as needed. For cough  Dispense: 40 capsule; Refill: 0  2. Controlled type 2 diabetes mellitus without complication, without long-term current use of insulin (HCC) Continue current regimen - Glucose (CBG)  3. Pharyngitis, unspecified etiology Salt water gargles - azithromycin (ZITHROMAX) 250 MG tablet; Take 2 tablets on day 1, then 1 tablet daily on days 2 through 5  Dispense: 6 tablet; Refill: 0  [employer] interpreters "David Barber" used and additional time performing visit was required.   Return for keep appt with Dr Margarita Rana.  The patient was given clear instructions to go to ER or return to medical center if symptoms don't improve, worsen or new problems develop. The patient verbalized understanding. The patient was told to call to get lab results if they haven't heard anything in the next week.      Freeman Caldron, PA-C [facility], [place]    08/05/2021, 11:19 AM

## 2021-08-06 ENCOUNTER — Other Ambulatory Visit: Payer: Self-pay

## 2021-08-09 ENCOUNTER — Other Ambulatory Visit: Payer: Self-pay

## 2021-08-25 ENCOUNTER — Other Ambulatory Visit: Payer: Self-pay

## 2021-08-30 ENCOUNTER — Other Ambulatory Visit: Payer: Self-pay

## 2021-09-30 ENCOUNTER — Ambulatory Visit: Payer: Medicare Other | Admitting: Family Medicine

## 2021-10-11 ENCOUNTER — Other Ambulatory Visit: Payer: Self-pay

## 2021-10-12 ENCOUNTER — Other Ambulatory Visit: Payer: Self-pay

## 2021-11-23 NOTE — Progress Notes (Signed)
   Established Patient Office Visit  Subjective   Patient ID: David Barber, male    DOB: 1950-07-29  Age: 71 y.o. MRN: [id number]  No chief complaint on file.   HPI  {History (Optional):23778}  ROS    Objective:     There were no vitals taken for this visit. {Vitals History (Optional):23777}  Physical Exam   No results found for any visits on 11/24/21.  {Labs (Optional):23779}  The 10-year ASCVD risk score (Arnett DK, et al., 2019) is: 37.7%    Assessment & Plan:   Problem List Items Addressed This Visit   None   No follow-ups on file.    Asencion Noble, MD

## 2021-11-24 ENCOUNTER — Ambulatory Visit: Payer: Medicare Other | Attending: Family Medicine | Admitting: Critical Care Medicine

## 2021-11-24 ENCOUNTER — Encounter: Payer: Self-pay | Admitting: Critical Care Medicine

## 2021-11-24 ENCOUNTER — Other Ambulatory Visit: Payer: Self-pay

## 2021-11-24 VITALS — BP 109/77 | HR 82 | Temp 98.0°F | Ht 64.0 in | Wt 170.0 lb

## 2021-11-24 DIAGNOSIS — F17220 Nicotine dependence, chewing tobacco, uncomplicated: Secondary | ICD-10-CM | POA: Insufficient documentation

## 2021-11-24 DIAGNOSIS — Z713 Dietary counseling and surveillance: Secondary | ICD-10-CM | POA: Diagnosis not present

## 2021-11-24 DIAGNOSIS — J449 Chronic obstructive pulmonary disease, unspecified: Secondary | ICD-10-CM | POA: Insufficient documentation

## 2021-11-24 DIAGNOSIS — F17200 Nicotine dependence, unspecified, uncomplicated: Secondary | ICD-10-CM | POA: Insufficient documentation

## 2021-11-24 DIAGNOSIS — G4709 Other insomnia: Secondary | ICD-10-CM | POA: Insufficient documentation

## 2021-11-24 DIAGNOSIS — K5900 Constipation, unspecified: Secondary | ICD-10-CM | POA: Diagnosis not present

## 2021-11-24 DIAGNOSIS — Z720 Tobacco use: Secondary | ICD-10-CM | POA: Diagnosis not present

## 2021-11-24 DIAGNOSIS — E785 Hyperlipidemia, unspecified: Secondary | ICD-10-CM | POA: Diagnosis not present

## 2021-11-24 DIAGNOSIS — K5901 Slow transit constipation: Secondary | ICD-10-CM

## 2021-11-24 DIAGNOSIS — Z23 Encounter for immunization: Secondary | ICD-10-CM

## 2021-11-24 DIAGNOSIS — E1149 Type 2 diabetes mellitus with other diabetic neurological complication: Secondary | ICD-10-CM | POA: Diagnosis not present

## 2021-11-24 DIAGNOSIS — Z716 Tobacco abuse counseling: Secondary | ICD-10-CM | POA: Diagnosis not present

## 2021-11-24 DIAGNOSIS — Z7984 Long term (current) use of oral hypoglycemic drugs: Secondary | ICD-10-CM | POA: Insufficient documentation

## 2021-11-24 DIAGNOSIS — K219 Gastro-esophageal reflux disease without esophagitis: Secondary | ICD-10-CM | POA: Insufficient documentation

## 2021-11-24 DIAGNOSIS — E119 Type 2 diabetes mellitus without complications: Secondary | ICD-10-CM | POA: Insufficient documentation

## 2021-11-24 DIAGNOSIS — E1169 Type 2 diabetes mellitus with other specified complication: Secondary | ICD-10-CM

## 2021-11-24 DIAGNOSIS — K5909 Other constipation: Secondary | ICD-10-CM

## 2021-11-24 LAB — POCT GLYCOSYLATED HEMOGLOBIN (HGB A1C): HbA1c, POC (controlled diabetic range): 6.4 % (ref 0.0–7.0)

## 2021-11-24 LAB — GLUCOSE, POCT (MANUAL RESULT ENTRY): POC Glucose: 120 mg/dl — AB (ref 70–99)

## 2021-11-24 MED ORDER — TIOTROPIUM BROMIDE MONOHYDRATE 18 MCG IN CAPS
1.0000 | ORAL_CAPSULE | Freq: Every day | RESPIRATORY_TRACT | 6 refills | Status: DC
  Filled 2021-11-24: qty 30, 30d supply, fill #0

## 2021-11-24 MED ORDER — HYDROXYZINE HCL 25 MG PO TABS
25.0000 mg | ORAL_TABLET | Freq: Every evening | ORAL | 0 refills | Status: DC | PRN
  Filled 2021-11-24: qty 30, 30d supply, fill #0

## 2021-11-24 MED ORDER — METFORMIN HCL ER 500 MG PO TB24
500.0000 mg | ORAL_TABLET | Freq: Every day | ORAL | 6 refills | Status: DC
  Filled 2021-11-24: qty 30, 30d supply, fill #0

## 2021-11-24 MED ORDER — GABAPENTIN 300 MG PO CAPS
ORAL_CAPSULE | Freq: Every day | ORAL | 6 refills | Status: DC
  Filled 2021-11-24: qty 30, 30d supply, fill #0
  Filled 2022-04-15 (×2): qty 30, 30d supply, fill #1
  Filled 2022-07-13: qty 30, 30d supply, fill #2
  Filled 2022-09-15: qty 30, 30d supply, fill #3

## 2021-11-24 MED ORDER — ALBUTEROL SULFATE HFA 108 (90 BASE) MCG/ACT IN AERS
2.0000 | INHALATION_SPRAY | Freq: Four times a day (QID) | RESPIRATORY_TRACT | 6 refills | Status: DC | PRN
  Filled 2021-11-24: qty 18, 25d supply, fill #0

## 2021-11-24 MED ORDER — ROSUVASTATIN CALCIUM 20 MG PO TABS
ORAL_TABLET | Freq: Every day | ORAL | 6 refills | Status: DC
  Filled 2021-11-24: qty 30, 30d supply, fill #0

## 2021-11-24 MED ORDER — NICOTINE POLACRILEX 4 MG MT LOZG: LOZENGE | OROMUCOSAL | 4 refills | Status: DC

## 2021-11-24 MED ORDER — POLYETHYLENE GLYCOL 3350 17 G PO PACK
17.0000 g | PACK | Freq: Every day | ORAL | 6 refills | Status: DC
  Filled 2021-11-24: qty 30, 30d supply, fill #0
  Filled 2021-11-24: qty 28, 28d supply, fill #0

## 2021-11-24 NOTE — Assessment & Plan Note (Signed)
Mild COPD patient will resume Spiriva daily

## 2021-11-24 NOTE — Assessment & Plan Note (Signed)
  .   Current smoking consumption amount: Chews tobacco daily  . Dicsussion on advise to quit smoking and smoking impacts: Cardiovascular impacts  . Patient's willingness to quit: Not ready to quit  . Methods to quit smoking discussed: Nicotine lozenge  . Medication management of smoking session drugs discussed: Nicotine lozenge  . Resources provided:  AVS   . Setting quit date not established  . Follow-up arranged 4 months   Time spent counseling the patient: 5 minutes

## 2021-11-24 NOTE — Assessment & Plan Note (Signed)
Continue with rosuvastatin daily we will obtain lipid panel

## 2021-11-24 NOTE — Patient Instructions (Addendum)
Obtain eye exam this calendar year  Start taking the MiraLAX 1 scoop in a 12 ounce glass of water at room temperature daily for constipation  Review handouts regarding the need to increase fiber in the diet  Use nicotine lozenge twice daily to reduce tobacco intake  Labs today include urine for albumin and metabolic panel and cholesterol  Return to see Dr. Margarita Rana in 5 months  Your A1C is 6.5 is good  ?? ?????????? ???? ???? ??????? ??????? ?????????  ????????? ???? ????? ?????? ????????? ?? ??? ????? ?????? MiraLAX 1 ????? ??? ???? ?????????  ?????? ????? ?????? ????? ???????? ???????? ????????????? ??????? ?????????  ?????????? ???????? ???? ?? ???? ????? ??? ??? ??????? ??????? ?????? ?????????  ?? ?????????? ???????? ? ????????? ?????? ? ????????????? ???? ????? ?????? ?????  ? ??????? ??. ?????????? ????? ??????????  ?????? A1C 6.5 ?????? ? Y? ky?l?n?ara var?a ?m?kh? par?k?a?a pr?pta garnuh?s  kabjiyatak? l?gi dainika k??h?k? t?pakramam? 12 aunsa gil?sa p?n?m? MiraLAX 1 sk?pa lina suru garnuh?s  ?h?ram? ph?'ibara ba?h?'unu parn? ?va?yakat? sambandh? hy?n?a'?'u?ahar? sam?k?? garnuh?s  surt?jan'ya pad?rthak? s?vana kama garna dinam? du'? pa?aka nik??ina l?j?nja pray?ga garnuh?s  ?ja ly?bahar?l? ?lbumina ra m???b?lika py?nala ra k?l?s?r?lak? l?gi m?tra sam?v??a gardacha  5 mahin?m? ??. N'y?linal?'? bh??na pharkanuh?s  tap?'?k? A1C 6.5 R?mr? cha

## 2021-11-24 NOTE — Assessment & Plan Note (Signed)
Patient given a high-fiber diet and MiraLAX refilled

## 2021-11-24 NOTE — Assessment & Plan Note (Signed)
Will monitor

## 2021-11-24 NOTE — Assessment & Plan Note (Signed)
Diabetes under good control A1c 6.4 continue with metformin

## 2021-11-25 LAB — LIPID PANEL
Chol/HDL Ratio: 3.9 ratio (ref 0.0–5.0)
Cholesterol, Total: 141 mg/dL (ref 100–199)
HDL: 36 mg/dL — ABNORMAL LOW (ref >39)
LDL Chol Calc (NIH): 74 mg/dL (ref 0–99)
Triglycerides: 183 mg/dL — ABNORMAL HIGH (ref 0–149)
VLDL Cholesterol Cal: 31 mg/dL (ref 5–40)

## 2021-11-25 LAB — BMP8+EGFR
BUN/Creatinine Ratio: 10 (ref 10–24)
BUN: 8 mg/dL (ref 8–27)
CO2: 23 mmol/L (ref 20–29)
Calcium: 9.8 mg/dL (ref 8.6–10.2)
Chloride: 106 mmol/L (ref 96–106)
Creatinine, Ser: 0.83 mg/dL (ref 0.76–1.27)
Glucose: 104 mg/dL — ABNORMAL HIGH (ref 70–99)
Potassium: 4.2 mmol/L (ref 3.5–5.2)
Sodium: 143 mmol/L (ref 134–144)
eGFR: 93 mL/min/{1.73_m2} (ref >59)

## 2021-11-25 NOTE — Progress Notes (Signed)
Let pt know cholesterol is a goal no medication change stay on crestor, kidney normal

## 2021-11-29 ENCOUNTER — Telehealth: Payer: Self-pay

## 2021-11-29 NOTE — Telephone Encounter (Signed)
Opened in error

## 2021-11-29 NOTE — Telephone Encounter (Signed)
-----   Message from Elsie Stain, MD sent at 11/25/2021  6:15 AM EDT ----- Let pt know cholesterol is a goal no medication change stay on crestor, kidney normal

## 2021-12-01 ENCOUNTER — Other Ambulatory Visit: Payer: Self-pay

## 2021-12-01 ENCOUNTER — Telehealth: Payer: Self-pay

## 2021-12-01 NOTE — Telephone Encounter (Signed)
Pt was called and vm was left, Information has been sent to nurse pool.    Interpreter id [id number]

## 2021-12-01 NOTE — Telephone Encounter (Signed)
-----   Message from Elsie Stain, MD sent at 11/25/2021  6:15 AM EDT ----- Let pt know cholesterol is a goal no medication change stay on crestor, kidney normal

## 2021-12-23 ENCOUNTER — Telehealth: Payer: Self-pay

## 2021-12-23 NOTE — Telephone Encounter (Signed)
CAP referral request received .   I called patient with assistance of Nepali interpreter [employer] to review and complete the application. Patient's daughter in law, Jwan Hornbaker, was also present on the phone had done  parts of the application. I was able to complete the application with her assistance.  Ruthy Dick was aware that after I submit the referral request, they will receive more documentation in the mail that will need to be completed by the family as well as PCP.   Completed request faxed to [facility].

## 2022-01-11 ENCOUNTER — Other Ambulatory Visit: Payer: Self-pay

## 2022-01-11 ENCOUNTER — Telehealth: Payer: Self-pay | Admitting: Family Medicine

## 2022-01-11 DIAGNOSIS — E1169 Type 2 diabetes mellitus with other specified complication: Secondary | ICD-10-CM

## 2022-01-11 DIAGNOSIS — J449 Chronic obstructive pulmonary disease, unspecified: Secondary | ICD-10-CM

## 2022-01-11 DIAGNOSIS — E119 Type 2 diabetes mellitus without complications: Secondary | ICD-10-CM

## 2022-01-11 MED ORDER — METFORMIN HCL ER 500 MG PO TB24
500.0000 mg | ORAL_TABLET | Freq: Every day | ORAL | 0 refills | Status: DC
  Filled 2022-01-11: qty 90, 90d supply, fill #0

## 2022-01-11 MED ORDER — TIOTROPIUM BROMIDE MONOHYDRATE 18 MCG IN CAPS
1.0000 | ORAL_CAPSULE | Freq: Every day | RESPIRATORY_TRACT | 0 refills | Status: DC
  Filled 2022-01-11: qty 90, 90d supply, fill #0

## 2022-01-11 MED ORDER — ALBUTEROL SULFATE HFA 108 (90 BASE) MCG/ACT IN AERS
2.0000 | INHALATION_SPRAY | Freq: Four times a day (QID) | RESPIRATORY_TRACT | 6 refills | Status: DC | PRN
  Filled 2022-01-11: qty 36, 50d supply, fill #0

## 2022-01-11 MED ORDER — ROSUVASTATIN CALCIUM 20 MG PO TABS
20.0000 mg | ORAL_TABLET | Freq: Every day | ORAL | 0 refills | Status: DC
  Filled 2022-01-11: qty 90, 90d supply, fill #0

## 2022-01-11 NOTE — Telephone Encounter (Signed)
Patient's daughter called in about patient going out of country to [place] and is requesting extras on his medicine for December

## 2022-01-11 NOTE — Telephone Encounter (Signed)
Patient aware that Rx was sent for 90 day supply to pharmacy.

## 2022-01-12 ENCOUNTER — Other Ambulatory Visit: Payer: Self-pay

## 2022-01-17 ENCOUNTER — Other Ambulatory Visit: Payer: Self-pay

## 2022-04-06 ENCOUNTER — Telehealth: Payer: Self-pay | Admitting: Family Medicine

## 2022-04-06 NOTE — Telephone Encounter (Signed)
error 

## 2022-04-15 ENCOUNTER — Other Ambulatory Visit: Payer: Self-pay

## 2022-04-15 ENCOUNTER — Other Ambulatory Visit: Payer: Self-pay | Admitting: Internal Medicine

## 2022-04-15 DIAGNOSIS — E785 Hyperlipidemia, unspecified: Secondary | ICD-10-CM

## 2022-04-15 DIAGNOSIS — E119 Type 2 diabetes mellitus without complications: Secondary | ICD-10-CM

## 2022-04-15 DIAGNOSIS — E1169 Type 2 diabetes mellitus with other specified complication: Secondary | ICD-10-CM

## 2022-04-15 MED ORDER — ROSUVASTATIN CALCIUM 20 MG PO TABS
20.0000 mg | ORAL_TABLET | Freq: Every day | ORAL | 0 refills | Status: DC
  Filled 2022-04-15: qty 90, 90d supply, fill #0

## 2022-04-15 MED ORDER — METFORMIN HCL ER 500 MG PO TB24
500.0000 mg | ORAL_TABLET | Freq: Every day | ORAL | 0 refills | Status: DC
  Filled 2022-04-15: qty 90, 90d supply, fill #0

## 2022-04-19 ENCOUNTER — Other Ambulatory Visit: Payer: Self-pay

## 2022-04-28 ENCOUNTER — Ambulatory Visit: Payer: Medicare Other | Attending: Family Medicine

## 2022-04-28 VITALS — Ht 66.0 in | Wt 175.0 lb

## 2022-04-28 DIAGNOSIS — Z0000 Encounter for general adult medical examination without abnormal findings: Secondary | ICD-10-CM

## 2022-04-28 NOTE — Progress Notes (Signed)
I connected with David Barber today by telephone and verified that I am speaking with the correct person using two identifiers. Location patient: home Location provider: work Persons participating in the virtual visit: David Barber, Thurmond Butts (interpreter), Glenna Durand LPN   I discussed the limitations, risks, security and privacy concerns of performing an evaluation and management service by telephone and the availability of in person appointments. I also discussed with the patient that there may be a patient responsible charge related to this service. The patient expressed understanding and verbally consented to this telephonic visit.    Interactive audio and video telecommunications were attempted between this provider and patient, however failed, due to patient having technical difficulties OR patient did not have access to video capability.  We continued and completed visit with audio only.     Vital signs may be patient reported or missing.  Subjective:   David Barber is a 72 y.o. male who presents for an Initial Medicare Annual Wellness Visit.  Review of Systems     Cardiac Risk Factors include: advanced age (>25mn, >>24women);smoking/ tobacco exposure;diabetes mellitus;dyslipidemia;male gender     Objective:    Today's Vitals   04/28/22 0823  Weight: 175 lb (79.4 kg)  Height: 5' 6"$  (1.676 m)   Body mass index is 28.25 kg/m.     04/28/2022    8:44 AM 01/22/2019   11:59 AM 08/18/2018   10:35 AM 01/04/2017   10:07 AM 10/06/2016   10:11 AM 03/04/2016    9:11 PM 07/21/2015    3:53 PM  Advanced Directives  Does Patient Have a Medical Advance Directive? No No No No No No No  Would patient like information on creating a medical advance directive?  No - Patient declined   No - Patient declined No - Patient declined     Current Medications (verified) Outpatient Encounter Medications as of 04/28/2022  Medication Sig   albuterol (VENTOLIN HFA) 108 (90 Base) MCG/ACT inhaler  INHALE 2 PUFFS INTO THE LUNGS EVERY 6 (SIX) HOURS AS NEEDED FOR WHEEZING OR SHORTNESS OF BREATH.   gabapentin (NEURONTIN) 300 MG capsule TAKE 1 CAPSULE (300 MG TOTAL) BY MOUTH AT BEDTIME.   glucose blood (ACCU-CHEK GUIDE) test strip Check blood sugar once a day.   hydrOXYzine (ATARAX) 25 MG tablet Take 1 tablet (25 mg total) by mouth at bedtime as needed For insomnia.   metFORMIN (GLUCOPHAGE-XR) 500 MG 24 hr tablet Take 1 tablet (500 mg total) by mouth daily with breakfast.   rosuvastatin (CRESTOR) 20 MG tablet Take 1 tablet (20 mg total) by mouth daily.   tiotropium (SPIRIVA) 18 MCG inhalation capsule Place 1 capsule (18 mcg total) into inhaler and inhale daily.   aspirin EC 81 MG tablet Take 1 tablet (81 mg total) by mouth daily. (Patient not taking: Reported on 04/28/2022)   nicotine polacrilex (NICORETTE MINI) 4 MG lozenge Use twice a day to quit smoking (Patient not taking: Reported on 04/28/2022)   polyethylene glycol (MIRALAX / GLYCOLAX) 17 g packet MIX 17 GRAMS IN 80Z OF WATER/JUICE AND CONSUME ONCE DAILY. (Patient not taking: Reported on 04/28/2022)   [DISCONTINUED] atorvastatin (LIPITOR) 80 MG tablet Take 1 tablet (80 mg total) by mouth daily.   No facility-administered encounter medications on file as of 04/28/2022.    Allergies (verified) Patient has no known allergies.   History: Past Medical History:  Diagnosis Date   Allergy    Anxiety    Asthma    Bronchitis    Cataract  COPD (chronic obstructive pulmonary disease) (HCC)    Diabetes mellitus without complication (HCC)    GERD (gastroesophageal reflux disease)    Hyperlipidemia    Hypertension    Past Surgical History:  Procedure Laterality Date   COLONOSCOPY     CYST REMOVAL NECK  03/22/2011   benign   POLYPECTOMY     UPPER GASTROINTESTINAL ENDOSCOPY     Family History  Problem Relation Age of Onset   Colon cancer Neg Hx    Colon polyps Neg Hx    Esophageal cancer Neg Hx    Rectal cancer Neg Hx    Stomach  cancer Neg Hx    Social History   Socioeconomic History   Marital status: Married    Spouse name: Not on file   Number of children: Not on file   Years of education: Not on file   Highest education level: Not on file  Occupational History   Not on file  Tobacco Use   Smoking status: Former    Types: Cigarettes    Quit date: 10/19/2013    Years since quitting: 8.5   Smokeless tobacco: Current    Types: Chew   Tobacco comments:    chewing tobacco  Vaping Use   Vaping Use: Never used  Substance and Sexual Activity   Alcohol use: Not Currently   Drug use: No   Sexual activity: Not on file  Other Topics Concern   Not on file  Social History Narrative   Not on file   Social Determinants of Health   Financial Resource Strain: Low Risk  (04/28/2022)   Overall Financial Resource Strain (CARDIA)    Difficulty of Paying Living Expenses: Not hard at all  Food Insecurity: No Food Insecurity (04/28/2022)   Hunger Vital Sign    Worried About Running Out of Food in the Last Year: Never true    Ran Out of Food in the Last Year: Never true  Transportation Needs: No Transportation Needs (04/28/2022)   PRAPARE - Hydrologist (Medical): No    Lack of Transportation (Non-Medical): No  Physical Activity: Insufficiently Active (04/28/2022)   Exercise Vital Sign    Days of Exercise per Week: 7 days    Minutes of Exercise per Session: 20 min  Stress: No Stress Concern Present (04/28/2022)   [place]    Feeling of Stress : Not at all  Social Connections: Not on file    Tobacco Counseling Ready to quit: Yes Counseling given: Not Answered Tobacco comments: chewing tobacco   Clinical Intake:  Pre-visit preparation completed: Yes  Pain : No/denies pain     Nutritional Status: BMI 25 -29 Overweight Nutritional Risks: None Diabetes: Yes  How often do you need to have someone help you when you  read instructions, pamphlets, or other written materials from your doctor or pharmacy?: 4 - Often  Diabetic? Yes Nutrition Risk Assessment:  Has the patient had any N/V/D within the last 2 months?  No  Does the patient have any non-healing wounds?  Yes  Has the patient had any unintentional weight loss or weight gain?  No   Diabetes:  Is the patient diabetic?  Yes  If diabetic, was a CBG obtained today?  No  Did the patient bring in their glucometer from home?  No  How often do you monitor your CBG's? Does not.   Financial Strains and Diabetes Management:  Are you having any  financial strains with the device, your supplies or your medication? No .  Does the patient want to be seen by Chronic Care Management for management of their diabetes?  No  Would the patient like to be referred to a Nutritionist or for Diabetic Management?  No   Diabetic Exams:  Diabetic Eye Exam: Overdue for diabetic eye exam. Pt has been advised about the importance in completing this exam. Patient advised to call and schedule an eye exam. Diabetic Foot Exam: Completed 06/16/2021   Interpreter Needed?: Yes Interpreter Agency: [place] interpreters Interpreter Name: Amie Critchley ID: [id number]  Information entered by :: NAllen LPN   Activities of Daily Living    04/28/2022    8:49 AM  In your present state of health, do you have any difficulty performing the following activities:  Hearing? 1  Vision? 0  Difficulty concentrating or making decisions? 0  Walking or climbing stairs? 1  Dressing or bathing? 1  Doing errands, shopping? 1  Preparing Food and eating ? N  Using the Toilet? N  In the past six months, have you accidently leaked urine? N  Do you have problems with loss of bowel control? N  Managing your Medications? Y  Managing your Finances? N  Housekeeping or managing your Housekeeping? Y    Patient Care Team: Charlott Rakes, MD as PCP - General (Family Medicine) Bonney Aid,  PA-C as Registered Nurse ([place]) Boykin Nearing, MD as Referring Physician (Family Medicine)  Indicate any recent Medical Services you may have received from other than [facility] providers in the past year (date may be approximate).     Assessment:   This is a routine wellness examination for David Barber.  Hearing/Vision screen Vision Screening - Comments:: No regular eye exams  Dietary issues and exercise activities discussed: Current Exercise Habits: Home exercise routine, Type of exercise: stretching, Time (Minutes): 20, Frequency (Times/Week): 7, Weekly Exercise (Minutes/Week): 140   Goals Addressed             This Visit's Progress    Patient Stated       04/28/2022, no goals       Depression Screen    04/28/2022    8:46 AM 08/05/2021   10:42 AM 06/16/2021    4:25 PM 08/24/2020   10:51 AM 05/27/2020   10:04 AM 05/22/2018    2:44 PM 04/30/2018    1:34 PM  PHQ 2/9 Scores  PHQ - 2 Score 0  1 0 1 2 3  $ PHQ- 9 Score   6 0 6 8 12  $ Exception Documentation  Patient refusal         Fall Risk    04/28/2022    8:45 AM 11/24/2021    8:40 AM 08/05/2021   10:42 AM 06/16/2021    4:25 PM 05/27/2020    9:58 AM  Fall Risk   Falls in the past year? 0 0 0 0 0  Number falls in past yr: 0 0  0 0  Injury with Fall? 0 0  0 0  Risk for fall due to : Impaired balance/gait;Impaired mobility;Medication side effect No Fall Risks     Follow up Falls prevention discussed;Education provided;Falls evaluation completed Falls evaluation completed       FALL RISK PREVENTION PERTAINING TO THE HOME:  Any stairs in or around the home? No  If so, are there any without handrails? N/a Home free of loose throw rugs in walkways, pet beds, electrical cords, etc? No  Adequate lighting in  your home to reduce risk of falls? No   ASSISTIVE DEVICES UTILIZED TO PREVENT FALLS:  Life alert? No  Use of a cane, walker or w/c? Yes  Grab bars in the bathroom? No  Shower chair or bench in shower? Yes  Elevated toilet  seat or a handicapped toilet? No   TIMED UP AND GO:  Was the test performed? No .       Cognitive Function:  6 CIT not administered per language barrier. Appeared cognitive via conversation through the interpreter.        Immunizations Immunization History  Administered Date(s) Administered   Influenza,inj,Quad PF,6+ Mos 01/04/2017, 12/04/2018, 11/28/2019, 11/24/2021   PNEUMOCOCCAL CONJUGATE-20 08/24/2020   Pneumococcal Conjugate-13 01/04/2017   Pneumococcal Polysaccharide-23 04/30/2018   Tdap 05/15/2015    TDAP status: Up to date  Flu Vaccine status: Up to date  Pneumococcal vaccine status: Up to date  Covid-19 vaccine status: Completed vaccines  Qualifies for Shingles Vaccine? Yes   Zostavax completed No   Shingrix Completed?: No.    Education has been provided regarding the importance of this vaccine. Patient has been advised to call insurance company to determine out of pocket expense if they have not yet received this vaccine. Advised may also receive vaccine at local pharmacy or Health Dept. Verbalized acceptance and understanding.  Screening Tests Health Maintenance  Topic Date Due   Medicare Annual Wellness (AWV)  Never done   COVID-19 Vaccine (1) Never done   Zoster Vaccines- Shingrix (1 of 2) Never done   Diabetic kidney evaluation - Urine ACR  05/27/2021   OPHTHALMOLOGY EXAM  07/23/2021   HEMOGLOBIN A1C  05/25/2022   FOOT EXAM  06/17/2022   Diabetic kidney evaluation - eGFR measurement  11/25/2022   DTaP/Tdap/Td (2 - Td or Tdap) 05/14/2025   Pneumonia Vaccine 48+ Years old  Completed   INFLUENZA VACCINE  Completed   Hepatitis C Screening  Completed   HPV VACCINES  Aged Out   COLONOSCOPY (Pts 45-77yr Insurance coverage will need to be confirmed)  Discontinued    Health Maintenance  Health Maintenance Due  Topic Date Due   Medicare Annual Wellness (AWV)  Never done   COVID-19 Vaccine (1) Never done   Zoster Vaccines- Shingrix (1 of 2) Never  done   Diabetic kidney evaluation - Urine ACR  05/27/2021   OPHTHALMOLOGY EXAM  07/23/2021    Colorectal cancer screening: No longer required.   Lung Cancer Screening: (Low Dose CT Chest recommended if Age 73-80years, 30 pack-year currently smoking OR have quit w/in 15years.) does not qualify.   Lung Cancer Screening Referral: no  Additional Screening:  Hepatitis C Screening: does qualify; Completed 05/15/2015  Vision Screening: Recommended annual ophthalmology exams for early detection of glaucoma and other disorders of the eye. Is the patient up to date with their annual eye exam?  No  Who is the provider or what is the name of the office in which the patient attends annual eye exams? none If pt is not established with a provider, would they like to be referred to a provider to establish care? No .   Dental Screening: Recommended annual dental exams for proper oral hygiene  Community Resource Referral / Chronic Care Management: CRR required this visit?  No   CCM required this visit?  No      Plan:     I have personally reviewed and noted the following in the patient's chart:   Medical and social history Use of alcohol,  tobacco or illicit drugs  Current medications and supplements including opioid prescriptions. Patient is not currently taking opioid prescriptions. Functional ability and status Nutritional status Physical activity Advanced directives List of other physicians Hospitalizations, surgeries, and ER visits in previous 12 months Vitals Screenings to include cognitive, depression, and falls Referrals and appointments  In addition, I have reviewed and discussed with patient certain preventive protocols, quality metrics, and best practice recommendations. A written personalized care plan for preventive services as well as general preventive health recommendations were provided to patient.     Kellie Simmering, LPN   [id number]   Nurse Notes: none  Due to  this being a virtual visit, the after visit summary with patients personalized plan was offered to patient via mail or my-chart.  Patient would like to access on my-chart

## 2022-04-28 NOTE — Patient Instructions (Signed)
David Barber , Thank you for taking time to come for your Medicare Wellness Visit. I appreciate your ongoing commitment to your health goals. Please review the following plan we discussed and let me know if I can assist you in the future.   These are the goals we discussed:  Goals      Exercise 150 minutes per week (moderate activity)        This is a list of the screening recommended for you and due dates:  Health Maintenance  Topic Date Due   Medicare Annual Wellness Visit  Never done   COVID-19 Vaccine (1) Never done   Zoster (Shingles) Vaccine (1 of 2) Never done   Yearly kidney health urinalysis for diabetes  05/27/2021   Eye exam for diabetics  07/23/2021   Hemoglobin A1C  05/25/2022   Complete foot exam   06/17/2022   Yearly kidney function blood test for diabetes  11/25/2022   DTaP/Tdap/Td vaccine (2 - Td or Tdap) 05/14/2025   Pneumonia Vaccine  Completed   Flu Shot  Completed   Hepatitis C Screening: USPSTF Recommendation to screen - Ages 11-79 yo.  Completed   HPV Vaccine  Aged Out   Colon Cancer Screening  Discontinued    Advanced directives: Advance directive discussed with you today.   Conditions/risks identified: chews tobacco  Next appointment: Follow up in one year for your annual wellness visit.   Preventive Care 39 Years and Older, Male  Preventive care refers to lifestyle choices and visits with your health care provider that can promote health and wellness. What does preventive care include? A yearly physical exam. This is also called an annual well check. Dental exams once or twice a year. Routine eye exams. Ask your health care provider how often you should have your eyes checked. Personal lifestyle choices, including: Daily care of your teeth and gums. Regular physical activity. Eating a healthy diet. Avoiding tobacco and drug use. Limiting alcohol use. Practicing safe sex. Taking low doses of aspirin every day. Taking vitamin and mineral  supplements as recommended by your health care provider. What happens during an annual well check? The services and screenings done by your health care provider during your annual well check will depend on your age, overall health, lifestyle risk factors, and family history of disease. Counseling  Your health care provider may ask you questions about your: Alcohol use. Tobacco use. Drug use. Emotional well-being. Home and relationship well-being. Sexual activity. Eating habits. History of falls. Memory and ability to understand (cognition). Work and work Statistician. Screening  You may have the following tests or measurements: Height, weight, and BMI. Blood pressure. Lipid and cholesterol levels. These may be checked every 5 years, or more frequently if you are over 29 years old. Skin check. Lung cancer screening. You may have this screening every year starting at age 60 if you have a 30-pack-year history of smoking and currently smoke or have quit within the past 15 years. Fecal occult blood test (FOBT) of the stool. You may have this test every year starting at age 39. Flexible sigmoidoscopy or colonoscopy. You may have a sigmoidoscopy every 5 years or a colonoscopy every 10 years starting at age 92. Prostate cancer screening. Recommendations will vary depending on your family history and other risks. Hepatitis C blood test. Hepatitis B blood test. Sexually transmitted disease (STD) testing. Diabetes screening. This is done by checking your blood sugar (glucose) after you have not eaten for a while (fasting). You may  have this done every 1-3 years. Abdominal aortic aneurysm (AAA) screening. You may need this if you are a current or former smoker. Osteoporosis. You may be screened starting at age 26 if you are at high risk. Talk with your health care provider about your test results, treatment options, and if necessary, the need for more tests. Vaccines  Your health care provider  may recommend certain vaccines, such as: Influenza vaccine. This is recommended every year. Tetanus, diphtheria, and acellular pertussis (Tdap, Td) vaccine. You may need a Td booster every 10 years. Zoster vaccine. You may need this after age 2. Pneumococcal 13-valent conjugate (PCV13) vaccine. One dose is recommended after age 69. Pneumococcal polysaccharide (PPSV23) vaccine. One dose is recommended after age 64. Talk to your health care provider about which screenings and vaccines you need and how often you need them. This information is not intended to replace advice given to you by your health care provider. Make sure you discuss any questions you have with your health care provider. Document Released: 04/03/2015 Document Revised: 11/25/2015 Document Reviewed: 01/06/2015 Elsevier Interactive Patient Education  2017 [place] Prevention in the Home Falls can cause injuries. They can happen to people of all ages. There are many things you can do to make your home safe and to help prevent falls. What can I do on the outside of my home? Regularly fix the edges of walkways and driveways and fix any cracks. Remove anything that might make you trip as you walk through a door, such as a raised step or threshold. Trim any bushes or trees on the path to your home. Use bright outdoor lighting. Clear any walking paths of anything that might make someone trip, such as rocks or tools. Regularly check to see if handrails are loose or broken. Make sure that both sides of any steps have handrails. Any raised decks and porches should have guardrails on the edges. Have any leaves, snow, or ice cleared regularly. Use sand or salt on walking paths during winter. Clean up any spills in your garage right away. This includes oil or grease spills. What can I do in the bathroom? Use night lights. Install grab bars by the toilet and in the tub and shower. Do not use towel bars as grab bars. Use  non-skid mats or decals in the tub or shower. If you need to sit down in the shower, use a plastic, non-slip stool. Keep the floor dry. Clean up any water that spills on the floor as soon as it happens. Remove soap buildup in the tub or shower regularly. Attach bath mats securely with double-sided non-slip rug tape. Do not have throw rugs and other things on the floor that can make you trip. What can I do in the bedroom? Use night lights. Make sure that you have a light by your bed that is easy to reach. Do not use any sheets or blankets that are too big for your bed. They should not hang down onto the floor. Have a firm chair that has side arms. You can use this for support while you get dressed. Do not have throw rugs and other things on the floor that can make you trip. What can I do in the kitchen? Clean up any spills right away. Avoid walking on wet floors. Keep items that you use a lot in easy-to-reach places. If you need to reach something above you, use a strong step stool that has a grab bar. Keep electrical cords  out of the way. Do not use floor polish or wax that makes floors slippery. If you must use wax, use non-skid floor wax. Do not have throw rugs and other things on the floor that can make you trip. What can I do with my stairs? Do not leave any items on the stairs. Make sure that there are handrails on both sides of the stairs and use them. Fix handrails that are broken or loose. Make sure that handrails are as long as the stairways. Check any carpeting to make sure that it is firmly attached to the stairs. Fix any carpet that is loose or worn. Avoid having throw rugs at the top or bottom of the stairs. If you do have throw rugs, attach them to the floor with carpet tape. Make sure that you have a light switch at the top of the stairs and the bottom of the stairs. If you do not have them, ask someone to add them for you. What else can I do to help prevent falls? Wear  shoes that: Do not have high heels. Have rubber bottoms. Are comfortable and fit you well. Are closed at the toe. Do not wear sandals. If you use a stepladder: Make sure that it is fully opened. Do not climb a closed stepladder. Make sure that both sides of the stepladder are locked into place. Ask someone to hold it for you, if possible. Clearly mark and make sure that you can see: Any grab bars or handrails. First and last steps. Where the edge of each step is. Use tools that help you move around (mobility aids) if they are needed. These include: Canes. Walkers. Scooters. Crutches. Turn on the lights when you go into a dark area. Replace any light bulbs as soon as they burn out. Set up your furniture so you have a clear path. Avoid moving your furniture around. If any of your floors are uneven, fix them. If there are any pets around you, be aware of where they are. Review your medicines with your doctor. Some medicines can make you feel dizzy. This can increase your chance of falling. Ask your doctor what other things that you can do to help prevent falls. This information is not intended to replace advice given to you by your health care provider. Make sure you discuss any questions you have with your health care provider. Document Released: 01/01/2009 Document Revised: 08/13/2015 Document Reviewed: 04/11/2014 Elsevier Interactive Patient Education  2017 [employer].

## 2022-05-06 ENCOUNTER — Telehealth: Payer: Self-pay

## 2022-05-06 ENCOUNTER — Telehealth: Payer: Self-pay | Admitting: Family Medicine

## 2022-05-06 MED ORDER — MISC. DEVICES MISC
0 refills | Status: AC
  Filled 2022-05-06: qty 1, fill #0

## 2022-05-06 NOTE — Telephone Encounter (Signed)
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   [place] explained that the initial order was placed 01/28/2019 by Charlyne Quale, MD/[facility] said that they do not need new orders for O2 every year but his insurance changed from [facility] to Medicare and a new order is needed.  I  explained that we have no indication to support the need for O2 and his has been over 3 years with O2 in the home.   Magda Paganini then said to disregard this fax and they will send a technician to his home and see if he is still using it and will notify us of the status. At that time he would either need to come into the clinic for a walk test or they would need an order to discontinue use.

## 2022-05-06 NOTE — Telephone Encounter (Signed)
No he does not need oxygen. Order is in chart to discontinue.

## 2022-05-06 NOTE — Telephone Encounter (Signed)
[place] calling from [facility] is calling to report that the patient unaware if the pt needs oxygen. The last order was in 2020. Pt is on Medicare and new order needed. If the pt does not need oxygen please send a dc order.  CB- [phone] Fax-[phone]

## 2022-05-09 ENCOUNTER — Other Ambulatory Visit: Payer: Self-pay

## 2022-05-09 NOTE — Telephone Encounter (Signed)
Discontinue order Faxed to [facility] .

## 2022-05-11 NOTE — Telephone Encounter (Signed)
David Barber with [facility] called back stating a nurse did go by the patients house and he still is in fact using the oxygen. She was told to call the providers office back to let the provider know as soon as she found out. Please assist further

## 2022-05-11 NOTE — Telephone Encounter (Signed)
Dr Margarita Rana, Please see message that I received from [facility] today regarding the O2.

## 2022-05-12 NOTE — Telephone Encounter (Signed)
I received a call from [facility] this morning.  She said that they had a tech go to the patient's home and the patient said he is still using the O2 occasionally. They received the order to discontinue the O2 and want to know if you still want to discontinue it

## 2022-05-12 NOTE — Telephone Encounter (Signed)
He will need to be scheduled for walk test in the office to determine if ongoing oxygen therapy is needed.  Thank you.

## 2022-05-18 NOTE — Telephone Encounter (Signed)
I called the patient with the assistance of [place] interpreter [id number]/[employer]. His son, Laveda Norman, answered and I explained that we need to test his father to make sure that he still needs the oxygen.  This is required by his insurance company.  Laveda Norman said he could bring him to the clinic - appointment scheduled for  05/24/2022 @ 1330.  I text the appointment information and clinic address to [facility] as he requested.

## 2022-05-24 ENCOUNTER — Telehealth (HOSPITAL_BASED_OUTPATIENT_CLINIC_OR_DEPARTMENT_OTHER): Payer: Medicaid Other

## 2022-05-24 ENCOUNTER — Ambulatory Visit: Payer: Medicare Other | Attending: Family Medicine

## 2022-05-24 DIAGNOSIS — K219 Gastro-esophageal reflux disease without esophagitis: Secondary | ICD-10-CM | POA: Insufficient documentation

## 2022-05-24 DIAGNOSIS — E119 Type 2 diabetes mellitus without complications: Secondary | ICD-10-CM | POA: Diagnosis not present

## 2022-05-24 DIAGNOSIS — J449 Chronic obstructive pulmonary disease, unspecified: Secondary | ICD-10-CM | POA: Insufficient documentation

## 2022-05-24 DIAGNOSIS — E1140 Type 2 diabetes mellitus with diabetic neuropathy, unspecified: Secondary | ICD-10-CM | POA: Diagnosis not present

## 2022-05-24 NOTE — Progress Notes (Signed)
Patient in the office today for oxygen walk test sitting O2 92% and during exertion 91%. Patient denies any SOB at rest or during exertion. Result of test have been routed to PCP

## 2022-05-25 NOTE — Telephone Encounter (Signed)
Does not meet criteria for oxygen supplementation.  Discontinuation order is in chart from 05/06/2022.  Thanks

## 2022-05-26 NOTE — Telephone Encounter (Signed)
Discontinue order has been sent to [facility]

## 2022-07-01 ENCOUNTER — Ambulatory Visit: Payer: Self-pay

## 2022-07-01 NOTE — Telephone Encounter (Signed)
Summary: weakness   Pt's son called saying dad is feeling weak and doesn't have an appetite.  Wanted to make an appt at [facility] but nothing available  [phone]      Called son - lef message on machine to call back.

## 2022-07-01 NOTE — Telephone Encounter (Signed)
Noted  

## 2022-07-01 NOTE — Telephone Encounter (Signed)
  Chief Complaint: weakness Symptoms: fatigue and weakness, decreased appetite, joint pain Frequency: yesterday Pertinent Negatives: Patient denies any of sx Disposition: [] ED /[x] Urgent Care (no appt availability in office) / [] Appointment(In office/virtual)/ []  [facility]/ [] Home Care/ [] Refused Recommended Disposition /[] [facility]/ []  Follow-up with PCP Additional Notes: spoke with pt's son Harvie Junior. He is concerned and pt had cold last week so unsure if r/t that or not but wanting him seen. No appts with practice until 07/14/22, scheduled UC appt tomorrow at 1530 per son's request.       Summary: weakness     Pt's son called saying dad is feeling weak and doesn't have an appetite.  Wanted to make an appt at [facility] but nothing available  [phone]       Reason for Disposition  [1] MODERATE weakness (i.e., interferes with work, school, normal activities) AND [2] persists > 3 days  Answer Assessment - Initial Assessment Questions 1. DESCRIPTION: "Describe how you are feeling."     Feeling weak and no energy  2. SEVERITY: "How bad is it?"  "Can you stand and walk?"   - MILD (0-3): Feels weak or tired, but does not interfere with work, school or normal activities.   - MODERATE (4-7): Able to stand and walk; weakness interferes with work, school, or normal activities.   - SEVERE (8-10): Unable to stand or walk; unable to do usual activities.     moderate 3. ONSET: "When did these symptoms begin?" (e.g., hours, days, weeks, months)     Yesterday  4. CAUSE: "What do you think is causing the weakness or fatigue?" (e.g., not drinking enough fluids, medical problem, trouble sleeping)     Had cold last week  6. OTHER SYMPTOMS: "Do you have any other symptoms?" (e.g., chest pain, fever, cough, SOB, vomiting, diarrhea, bleeding, other areas of pain)     Decreased appetite, pain in joints  Protocols used: Weakness (Generalized) and Fatigue-A-AH

## 2022-07-01 NOTE — Telephone Encounter (Signed)
Summary: weakness   Pt's son called saying dad is feeling weak and doesn't have an appetite.  Wanted to make an appt at [facility] but nothing available  [phone]      Called son - left message on machine to return call.

## 2022-07-02 ENCOUNTER — Ambulatory Visit (HOSPITAL_COMMUNITY): Payer: Self-pay

## 2022-07-02 ENCOUNTER — Ambulatory Visit (HOSPITAL_COMMUNITY)
Admission: RE | Admit: 2022-07-02 | Discharge: 2022-07-02 | Disposition: A | Discharge status: Home / Self Care | Payer: Medicare Other | Source: Ambulatory Visit | Attending: Internal Medicine | Admitting: Internal Medicine

## 2022-07-02 ENCOUNTER — Encounter (HOSPITAL_COMMUNITY): Payer: Self-pay

## 2022-07-02 VITALS — BP 108/72 | HR 94 | Temp 97.7°F | Resp 18 | Ht 66.0 in | Wt 175.0 lb

## 2022-07-02 DIAGNOSIS — J441 Chronic obstructive pulmonary disease with (acute) exacerbation: Secondary | ICD-10-CM | POA: Diagnosis not present

## 2022-07-02 DIAGNOSIS — J449 Chronic obstructive pulmonary disease, unspecified: Secondary | ICD-10-CM

## 2022-07-02 DIAGNOSIS — R21 Rash and other nonspecific skin eruption: Secondary | ICD-10-CM

## 2022-07-02 DIAGNOSIS — H6122 Impacted cerumen, left ear: Secondary | ICD-10-CM | POA: Diagnosis not present

## 2022-07-02 MED ORDER — DEXAMETHASONE SODIUM PHOSPHATE 10 MG/ML IJ SOLN
INTRAMUSCULAR | Status: AC
  Filled 2022-07-02: qty 1

## 2022-07-02 MED ORDER — AZITHROMYCIN 250 MG PO TABS: 250.0000 mg | ORAL_TABLET | Freq: Every day | ORAL | 0 refills | Status: DC

## 2022-07-02 MED ORDER — DEXAMETHASONE SODIUM PHOSPHATE 10 MG/ML IJ SOLN
10.0000 mg | Freq: Once | INTRAMUSCULAR | Status: AC
  Administered 2022-07-02: 10 mg via INTRAMUSCULAR

## 2022-07-02 MED ORDER — BENZONATATE 100 MG PO CAPS: 100.0000 mg | ORAL_CAPSULE | Freq: Three times a day (TID) | ORAL | 0 refills | Status: DC

## 2022-07-02 NOTE — ED Triage Notes (Signed)
Patient having weakness and fatigue onset 2 weeks.   No sick symptoms. No known sick exposure. No new foods or medications. Patient has a low appetite. No recent falls or injuries.   Pain in the ears, itching around the belly and behind.

## 2022-07-02 NOTE — Discharge Instructions (Signed)
COPD ?? ?????: - Azithromycin ???????????? ????????? ????? - ????????? ????????????? ??, ???? ??????? ?????? ???? ?? ???? ? ????? ?-? ????? ??????? ????/??? ????? ?????? ??? ????? ???? ??????? ?????? ??? ????????? - ?????? ???? ?????? ?? ?????? ???????? 8 ??????? ?????? ???????  ???: ?????? ???????? ??? ??????????, ??????? ????? ?????????? ???? ???? ?????????? ? ???????? ?????? ?????? ???? ??????????? ????? ?????? ?????? ????????? ?????? ?????????? ???? ???????? ??????? ?????? ???? ????? ???????? ???? ??? ????? ????? ??????? ???? ??????? ???????????? ???? PCP ??? ??????  ??? ???????: ?????? ?? ??????? ??? ????? ?????????? ??????? ????? ?? ?????????? ???? ?????? ????? ????????? ????????? ??? ??????? ?????? ???? ???????????  ????? ???: ?????? ???? ????? 1 ??????? ????? ???????? ?????? ?????????? ?????? ??? ?????? ??????????? ??? ?????? ???????? ???? ?? ?? ?????? ???? ER ?? ????????? ???? ??? ? ?? ???? ?? ?????? ????? ??????????! COPD k? ba?h?v?: - Azithromycin ?n?ib?y??ika nirdh?rita r?pam? - klinikam? ??ks?m?th?s?na ?a?a, yasal? tap?'??k? ch?t?m? s?jana kama garna ra ark? 2-3 dinam? tap?'??k? kh?k?/s?sa ph?rna g?hr? huna maddata garna tap?'??k? ?ar?ram? k?ma garirahan?cha. - Kh?k?k? l?gi ?va?yaka bha'? anus?ra praty?ka 8 gha???m? ??sal?na parlsa.  D?ga: D?n?m? ?kv?ph?ra malama lag?'unuh?s, tap?'?nl? yasal?'? k?'un?arab??a kharida garna saknuhuncha ra ?va?yakat? anus?ra pray?ga garna saknuhuncha. Yasal?'? sukhkh? ch?l?k? k??tram? pray?ga garnuh?s. Mail? tap?'im?l?'? tap?'im?k? kh?k?k? l?gi di'?k? s??r?'i?a ?a?al? pani d?gam? maddata garn?cha. Yasak? nirantara m?ly??kanak? l?gi PCP sam?ga phal?'apa.  K?na cil?'un?: H?m?l? ?ja tap?'??k? k?na b?hira nik?lyau?. Tap?'?nl? k?nam? thapa lak?a?ahar?k? l?gi ?va?yaka r?pam? k?'un?aram? ??br?ksa iyara ?rapahar? pray?ga garna saknuhuncha.  Kr?pay? puna: J?m?cak? l?gi ark? 1 hapt?m? ?phn? pr?thamika h?rac?ha prad?yakasam?ga bh??n? samaya t?lik?  ban?'unuh?s. Yadi tap?'?k? lak?a?ahar? khar?ba bha'? v? gambh?ra bha'?m? ER m? j?nuh?s. Mal?'? ??? cha ki tap?'? ajha r?mr? mahasusa garnuhuncha!   COPD exacerbation: - Azithromycin antibiotic as prescribed - Dexamethasone shot in clinic, this will continue to work in your body to reduce inflammation to your chest and help with your cough/shortness of breath over the next 2-3 days - Tessalon perles every 8 hours as needed for cough.  Rash: Apply aquaphor ointment to the rash, you may purchase this over the counter and use this as needed. Use this to the areas of dry skin. The steroid shot I gave you for your cough will also help with the rash. Follow-up with PCP for ongoing evaluation of this.  Ear itching: We flushed out your ear today. You may use debrox ear drops over the counter as needed for further symptoms to the ear.   Please schedule an appointment with your primary care provider in the next 1 week for re-check. Go to the ER if your symptoms get worse or become severe. I hope you feel better!

## 2022-07-02 NOTE — ED Provider Notes (Signed)
[facility]    CSN: [id number] Arrival date & time: 07/02/22  1523      History   Chief Complaint Chief Complaint  Patient presents with   Fatigue    Entered by patient    HPI David Barber is a 72 y.o. male.   Patient presents to urgent care for evaluation of cough, generalized fatigue, cough, and intermittent shortness of breath that started 1-2 weeks ago. History of COPD, has been using albuterol inhaler with some relief. No fever, chills, N/V/D, dizziness, one-sided weakness, headache, chest pain, heart palpitations, or hemoptysis. No nasal congestion, vision changes, orthopnea, or leg swelling. Cough is dry, non-productive, and worse at nighttime. Denies recent antibiotic or steroid use. Former smoker, still uses chewing tobacco. Denies other drug use. No recent known sick contacts with similar symptoms.   Complains of left ear pain and discomfort that began a few days ago. States he has been "trying to clean it out" without relief. Reports decreased hearing to the left ear. Denies drainage from ears, tinnitus, and recent trauma/injury to the ears.   Also complains of rash to the abdomen and left low back that is very itchy and irritating. Rash started 1-2 months ago and is diffuse to the abdomen and patchy to the left lower back. When rash appeared, he stopped using soaps and this has helped. He also changed his laundry detergent to free and clear, states this has also helped. Denies recent known exposure to contacts with similar rash. He has not been using any over the counter medications to help with rash. Rash is not painful, but rather is intensely pruritic. Rash has not spread much over the last few months since onset.      Past Medical History:  Diagnosis Date   Allergy    Anxiety    Asthma    Bronchitis    Cataract    COPD (chronic obstructive pulmonary disease)    Diabetes mellitus without complication    GERD (gastroesophageal reflux disease)     Hyperlipidemia    Hypertension     Patient Active Problem List   Diagnosis Date Noted   Constipation 11/24/2021   Diabetic neuropathy 01/22/2018   Hyperlipidemia associated with type 2 diabetes mellitus 07/29/2015   Hearing loss 07/29/2015   Vitamin D deficiency 05/15/2015   Chronic low back pain 02/11/2015   COPD, mild (HCC) 11/17/2014   Chewing tobacco use 11/17/2014   GERD (gastroesophageal reflux disease) 06/18/2014   Nonspecific abnormal electrocardiogram (ECG) (EKG) 06/16/2014   Diabetes type 2, controlled 12/21/2012    Past Surgical History:  Procedure Laterality Date   COLONOSCOPY     CYST REMOVAL NECK  03/22/2011   benign   POLYPECTOMY     UPPER GASTROINTESTINAL ENDOSCOPY         Home Medications    Prior to Admission medications   Medication Sig Start Date End Date Taking? Authorizing Provider  albuterol (VENTOLIN HFA) 108 (90 Base) MCG/ACT inhaler INHALE 2 PUFFS INTO THE LUNGS EVERY 6 (SIX) HOURS AS NEEDED FOR WHEEZING OR SHORTNESS OF BREATH. 01/11/22  Yes Marcine Matar, MD  azithromycin (ZITHROMAX) 250 MG tablet Take 1 tablet (250 mg total) by mouth daily. Take first 2 tablets together, then 1 every day until finished. 07/02/22  Yes Carlisle Beers, FNP  benzonatate (TESSALON) 100 MG capsule Take 1 capsule (100 mg total) by mouth every 8 (eight) hours. 07/02/22  Yes Carlisle Beers, FNP  gabapentin (NEURONTIN) 300 MG capsule TAKE  1 CAPSULE (300 MG TOTAL) BY MOUTH AT BEDTIME. 11/24/21 11/24/22 Yes Storm Frisk, MD  glucose blood (ACCU-CHEK GUIDE) test strip Check blood sugar once a day. 10/12/20  Yes Hoy Register, MD  hydrOXYzine (ATARAX) 25 MG tablet Take 1 tablet (25 mg total) by mouth at bedtime as needed For insomnia. 11/24/21  Yes Storm Frisk, MD  metFORMIN (GLUCOPHAGE-XR) 500 MG 24 hr tablet Take 1 tablet (500 mg total) by mouth daily with breakfast. 04/15/22  Yes Hoy Register, MD  Misc. Devices MISC Discontinue oxygen 05/06/22  Yes  Newlin, Enobong, MD  polyethylene glycol (MIRALAX / GLYCOLAX) 17 g packet MIX 17 GRAMS IN 80Z OF WATER/JUICE AND CONSUME ONCE DAILY. 11/24/21  Yes Storm Frisk, MD  rosuvastatin (CRESTOR) 20 MG tablet Take 1 tablet (20 mg total) by mouth daily. 04/15/22  Yes Hoy Register, MD  tiotropium (SPIRIVA) 18 MCG inhalation capsule Place 1 capsule (18 mcg total) into inhaler and inhale daily. 01/11/22  Yes Marcine Matar, MD  aspirin EC 81 MG tablet Take 1 tablet (81 mg total) by mouth daily. Patient not taking: Reported on 04/28/2022 11/15/17   Anders Simmonds, PA-C  nicotine polacrilex (NICORETTE MINI) 4 MG lozenge Use twice a day to quit smoking Patient not taking: Reported on 04/28/2022 11/24/21   Storm Frisk, MD  atorvastatin (LIPITOR) 80 MG tablet Take 1 tablet (80 mg total) by mouth daily. 05/27/20 05/29/20  Hoy Register, MD    Family History Family History  Problem Relation Age of Onset   Colon cancer Neg Hx    Colon polyps Neg Hx    Esophageal cancer Neg Hx    Rectal cancer Neg Hx    Stomach cancer Neg Hx     Social History Social History   Tobacco Use   Smoking status: Former    Types: Cigarettes    Quit date: 10/19/2013    Years since quitting: 8.7   Smokeless tobacco: Current    Types: Chew   Tobacco comments:    chewing tobacco  Vaping Use   Vaping Use: Never used  Substance Use Topics   Alcohol use: Not Currently   Drug use: No     Allergies   Patient has no known allergies.   Review of Systems Review of Systems Per HPI  Physical Exam Triage Vital Signs ED Triage Vitals  Enc Vitals Group     BP 07/02/22 1556 108/72     Pulse Rate 07/02/22 1556 94     Resp 07/02/22 1556 18     Temp 07/02/22 1556 97.7 F (36.5 C)     Temp Source 07/02/22 1556 Oral     SpO2 07/02/22 1556 92 %     Weight 07/02/22 1556 175 lb 0.7 oz (79.4 kg)     Height 07/02/22 1556 5\' 6"  (1.676 m)     Head Circumference --      Peak Flow --      Pain Score 07/02/22 1551 3      Pain Loc --      Pain Edu? --      Excl. in GC? --    No data found.  Updated Vital Signs BP 108/72 (BP Location: Left Arm)   Pulse 94   Temp 97.7 F (36.5 C) (Oral)   Resp 18   Ht 5\' 6"  (1.676 m)   Wt 175 lb 0.7 oz (79.4 kg)   SpO2 92%   BMI 28.25 kg/m   Visual Acuity Right  Eye Distance:   Left Eye Distance:   Bilateral Distance:    Right Eye Near:   Left Eye Near:    Bilateral Near:     Physical Exam Vitals and nursing note reviewed.  Constitutional:      Appearance: Normal appearance. He is not ill-appearing or toxic-appearing.  HENT:     Head: Normocephalic and atraumatic.     Right Ear: Hearing, tympanic membrane, ear canal and external ear normal.     Left Ear: Hearing and external ear normal. There is impacted cerumen (impacted cerumen on initial assessment, clear after ear lavage by nursing staff with normal canal and TM).     Nose: Rhinorrhea present.     Mouth/Throat:     Lips: Pink.     Mouth: Mucous membranes are moist. No injury.     Tongue: No lesions. Tongue does not deviate from midline.     Palate: No mass and lesions.     Pharynx: Oropharynx is clear. Uvula midline. No pharyngeal swelling, oropharyngeal exudate, posterior oropharyngeal erythema or uvula swelling.     Tonsils: No tonsillar exudate or tonsillar abscesses.  Eyes:     General: Lids are normal. Vision grossly intact. Gaze aligned appropriately.     Extraocular Movements: Extraocular movements intact.     Conjunctiva/sclera: Conjunctivae normal.  Cardiovascular:     Rate and Rhythm: Normal rate and regular rhythm.     Heart sounds: Normal heart sounds, S1 normal and S2 normal.  Pulmonary:     Effort: Pulmonary effort is normal. No respiratory distress.     Breath sounds: Normal air entry. No stridor. No wheezing, rhonchi or rales.     Comments: Course breath sounds heard to all lung fields. Harsh/dry cough elicited with deep inspiration during lung exam. Speaking in full sentences  without difficulty.  Chest:     Chest wall: No tenderness.  Musculoskeletal:     Cervical back: Neck supple.     Right lower leg: No edema.     Left lower leg: No edema.  Lymphadenopathy:     Cervical: No cervical adenopathy.  Skin:    General: Skin is warm and dry.     Capillary Refill: Capillary refill takes less than 2 seconds.     Findings: Rash (Patch of dry, raised, and hyperpigmented/erythematous skin to the left low back near the belt line that appears inflamed and irritated. No drainage or warmth. No surrounding area of underlying soft-tissue swelling. Non-tender.) present.  Neurological:     General: No focal deficit present.     Mental Status: He is alert and oriented to person, place, and time. Mental status is at baseline.     Cranial Nerves: No cranial nerve deficit, dysarthria or facial asymmetry.     Sensory: No sensory deficit.     Motor: No weakness.     Coordination: Coordination normal.     Gait: Gait normal.  Psychiatric:        Mood and Affect: Mood normal.        Speech: Speech normal.        Behavior: Behavior normal.        Thought Content: Thought content normal.        Judgment: Judgment normal.      UC Treatments / Results  Labs (all labs ordered are listed, but only abnormal results are displayed) Labs Reviewed - No data to display  EKG   Radiology No results found.  Procedures Procedures (including critical care time)  Medications Ordered in UC Medications  dexamethasone (DECADRON) injection 10 mg (10 mg Intramuscular Given 07/02/22 1650)    Initial Impression / Assessment and Plan / UC Course  I have reviewed the triage vital signs and the nursing notes.  Pertinent labs & imaging results that were available during my care of the patient were reviewed by me and considered in my medical decision making (see chart for details).   1. COPD exacerbation Presentation consistent with mild COPD exacerbation. Course breath sounds throughout  without focal adventitious lung finding. Stable cardiopulmonary exam, hemodynamically stable vital signs, and overall nontoxic in appearance with no new oxygen requirement, therefore deferred imaging of the lungs. Low suspicion for focal pneumonia. Will manage this with azithromycin antibiotic, dexamethasone 10mg  IM in clinic, and as needed use of tessalon perles for symptomatic relief. Encouraged follow-up with PCP and/or pulmonology for ongoing evaluation and management of COPD. May continue using albuterol inhaler every 4-6 hours as needed for cough, shortness of breath, and wheeze.   2. Impacted cerumen of left ear Ears cleaned with ear lavage in clinic to remove ear wax impactions bilaterally by nursing staff. Reassessment shows clean left ear without signs of infection or drainage. Patient may use debrox ear drops at home as needed for wax removal and has been advised to avoid using Q-tips.   3. Irritant dermatitis, rash and non-specific skin eruption Rash appears inflammatory in nature and very dry/excoriated. No signs of secondary bacterial infection. Dexamethasone 10mg  as described above will help with itch to rash. Aquaphor emollient recommended to be applied several times throughout the day to keep area well covered and moisturized. PCP follow-up for ongoing evaluation recommended. No lesions present to the abdomen near the belt buckle, low suspicion for nickel dermatitis.   Discussed physical exam and available lab work findings in clinic with patient.  Counseled patient regarding appropriate use of medications and potential side effects for all medications recommended or prescribed today. Discussed red flag signs and symptoms of worsening condition,when to call the PCP office, return to urgent care, and when to seek higher level of care in the emergency department. Patient verbalizes understanding and agreement with plan. All questions answered. Patient discharged in stable  condition.    Final Clinical Impressions(s) / UC Diagnoses   Final diagnoses:  COPD exacerbation  Impacted cerumen of left ear  Rash and nonspecific skin eruption     Discharge Instructions      COPD ?? ?????: - Azithromycin ???????????? ????????? ????? - ????????? ????????????? ??, ???? ??????? ?????? ???? ?? ???? ? ????? ?-? ????? ??????? ????/??? ????? ?????? ??? ????? ???? ??????? ?????? ??? ????????? - ?????? ???? ?????? ?? ?????? ???????? 8 ??????? ?????? ???????  ???: ?????? ???????? ??? ??????????, ??????? ????? ?????????? ???? ???? ?????????? ? ???????? ?????? ?????? ???? ??????????? ????? ?????? ?????? ????????? ?????? ?????????? ???? ???????? ??????? ?????? ???? ????? ???????? ???? ??? ????? ????? ??????? ???? ??????? ???????????? ???? PCP ??? ??????  ??? ???????: ?????? ?? ??????? ??? ????? ?????????? ??????? ????? ?? ?????????? ???? ?????? ????? ????????? ????????? ??? ??????? ?????? ???? ???????????  ????? ???: ?????? ???? ????? 1 ??????? ????? ???????? ?????? ?????????? ?????? ??? ?????? ??????????? ??? ?????? ???????? ???? ?? ?? ?????? ???? ER ?? ????????? ???? ??? ? ?? ???? ?? ?????? ????? ??????????! COPD k? ba?h?v?: - Azithromycin ?n?ib?y??ika nirdh?rita r?pam? - klinikam? ??ks?m?th?s?na ?a?a, yasal? tap?'??k? ch?t?m? s?jana kama garna ra ark? 2-3 dinam? tap?'??k? kh?k?/s?sa ph?rna g?hr? huna maddata garna tap?'??k? ?ar?ram? k?ma garirahan?cha. - Kh?k?k?  l?gi ?va?yaka bha'? anus?ra praty?ka 8 gha???m? ??sal?na parlsa.  D?ga: D?n?m? ?kv?ph?ra malama lag?'unuh?s, tap?'?nl? yasal?'? k?'un?arab??a kharida garna saknuhuncha ra ?va?yakat? anus?ra pray?ga garna saknuhuncha. Yasal?'? sukhkh? ch?l?k? k??tram? pray?ga garnuh?s. Mail? tap?'im?l?'? tap?'im?k? kh?k?k? l?gi di'?k? s??r?'i?a ?a?al? pani d?gam? maddata garn?cha. Yasak? nirantara m?ly??kanak? l?gi PCP sam?ga phal?'apa.  K?na cil?'un?: H?m?l? ?ja tap?'??k? k?na b?hira nik?lyau?. Tap?'?nl? k?nam? thapa  lak?a?ahar?k? l?gi ?va?yaka r?pam? k?'un?aram? ??br?ksa iyara ?rapahar? pray?ga garna saknuhuncha.  Kr?pay? puna: J?m?cak? l?gi ark? 1 hapt?m? ?phn? pr?thamika h?rac?ha prad?yakasam?ga bh??n? samaya t?lik? ban?'unuh?s. Yadi tap?'?k? lak?a?ahar? khar?ba bha'? v? gambh?ra bha'?m? ER m? j?nuh?s. Mal?'? ??? cha ki tap?'? ajha r?mr? mahasusa garnuhuncha!   COPD exacerbation: - Azithromycin antibiotic as prescribed - Dexamethasone shot in clinic, this will continue to work in your body to reduce inflammation to your chest and help with your cough/shortness of breath over the next 2-3 days - Tessalon perles every 8 hours as needed for cough.  Rash: Apply aquaphor ointment to the rash, you may purchase this over the counter and use this as needed. Use this to the areas of dry skin. The steroid shot I gave you for your cough will also help with the rash. Follow-up with PCP for ongoing evaluation of this.  Ear itching: We flushed out your ear today. You may use debrox ear drops over the counter as needed for further symptoms to the ear.   Please schedule an appointment with your primary care provider in the next 1 week for re-check. Go to the ER if your symptoms get worse or become severe. I hope you feel better!      ED Prescriptions     Medication Sig Dispense Auth. Provider   benzonatate (TESSALON) 100 MG capsule Take 1 capsule (100 mg total) by mouth every 8 (eight) hours. 21 capsule Reita May M, FNP   azithromycin (ZITHROMAX) 250 MG tablet Take 1 tablet (250 mg total) by mouth daily. Take first 2 tablets together, then 1 every day until finished. 6 tablet Carlisle Beers, FNP      PDMP not reviewed this encounter.   Carlisle Beers, [place] 07/04/22 1801

## 2022-07-05 ENCOUNTER — Telehealth: Payer: Self-pay

## 2022-07-05 NOTE — Telephone Encounter (Signed)
With the assistance of [place] interpreter: [id number]/[employer],    I called patient and his son, Harvie Junior to explained that we received a referral for CAP services but a referral for CAP services was sent to [facility] 12/2021.  This is a new request is for CAP services through  [facility].  I told them that I would re-submit the referral as this program is now managed by [facility].  They did not have any questions but were not completely sure what the services were regarding. Patient's daughter in law, Gershon Crane, was at work at the time of this call.   Gershon Crane is who I spoke with to complete the initial application.  She is also the referrer for the new application

## 2022-07-06 NOTE — Telephone Encounter (Signed)
Completed CAP referral faxed to [facility]: [phone]

## 2022-07-13 ENCOUNTER — Other Ambulatory Visit: Payer: Self-pay | Admitting: Internal Medicine

## 2022-07-13 ENCOUNTER — Other Ambulatory Visit: Payer: Self-pay | Admitting: Family Medicine

## 2022-07-13 ENCOUNTER — Other Ambulatory Visit: Payer: Self-pay

## 2022-07-13 DIAGNOSIS — J449 Chronic obstructive pulmonary disease, unspecified: Secondary | ICD-10-CM

## 2022-07-13 DIAGNOSIS — E1169 Type 2 diabetes mellitus with other specified complication: Secondary | ICD-10-CM

## 2022-07-13 DIAGNOSIS — E119 Type 2 diabetes mellitus without complications: Secondary | ICD-10-CM

## 2022-07-14 ENCOUNTER — Other Ambulatory Visit: Payer: Self-pay

## 2022-07-14 MED ORDER — METFORMIN HCL ER 500 MG PO TB24
500.0000 mg | ORAL_TABLET | Freq: Every day | ORAL | 0 refills | Status: DC
  Filled 2022-07-14: qty 30, 30d supply, fill #0

## 2022-07-14 MED ORDER — TIOTROPIUM BROMIDE MONOHYDRATE 18 MCG IN CAPS
1.0000 | ORAL_CAPSULE | Freq: Every day | RESPIRATORY_TRACT | 0 refills | Status: DC
Start: 2022-07-14 — End: 2022-11-02
  Filled 2022-07-14: qty 90, 90d supply, fill #0

## 2022-07-14 MED ORDER — ROSUVASTATIN CALCIUM 20 MG PO TABS
20.0000 mg | ORAL_TABLET | Freq: Every day | ORAL | 0 refills | Status: DC
  Filled 2022-07-14: qty 30, 30d supply, fill #0

## 2022-07-14 NOTE — Telephone Encounter (Signed)
Requested Prescriptions  Pending Prescriptions Disp Refills   tiotropium (SPIRIVA HANDIHALER) 18 MCG inhalation capsule 90 capsule 0    Sig: Place 1 capsule (18 mcg total) into inhaler and inhale daily.     Pulmonology:  Anticholinergic Agents Passed - 07/13/2022  5:44 PM      Passed - Valid encounter within last 12 months    Recent Outpatient Visits           7 months ago Controlled type 2 diabetes mellitus without complication, without long-term current use of insulin [facility])   [place] [facility] Storm Frisk, MD   11 months ago Upper respiratory tract infection, unspecified type   [facility] [place], Marzella [place]lein, [place]   1 year ago Other constipation   [place] [facility] [place], Odette Horns, MD   1 year ago Controlled type 2 diabetes mellitus without complication, without long-term current use of insulin [facility])   [place] [facility] [place], [place] W, NP   1 year ago Controlled type 2 diabetes mellitus without complication, without long-term current use of insulin [facility])   [place] [facility] Hoy Register, MD

## 2022-07-14 NOTE — Telephone Encounter (Signed)
Requested medication (s) are due for refill today:   Yes for both  Requested medication (s) are on the active medication list:   Yes for both  Future visit scheduled:   No  Seen 7 months ago   Last ordered: Crestor 04/15/2022 #90, 0 refills;   Metformin 04/15/2022 #90, 0 refills    Returned because labs are due per protocol.     Requested Prescriptions  Pending Prescriptions Disp Refills   rosuvastatin (CRESTOR) 20 MG tablet 90 tablet 0    Sig: Take 1 tablet (20 mg total) by mouth daily.     Cardiovascular:  Antilipid - Statins 2 Failed - 07/13/2022  5:44 PM      Failed - Lipid Panel in normal range within the last 12 months    Cholesterol, Total  Date Value Ref Range Status  11/24/2021 141 100 - 199 mg/dL Final   LDL Chol Calc (NIH)  Date Value Ref Range Status  11/24/2021 74 0 - 99 mg/dL Final   HDL  Date Value Ref Range Status  11/24/2021 36 (L) >39 mg/dL Final   Triglycerides  Date Value Ref Range Status  11/24/2021 183 (H) 0 - 149 mg/dL Final         Passed - Cr in normal range and within 360 days    Creat  Date Value Ref Range Status  07/21/2015 0.79 0.70 - 1.25 mg/dL Final   Creatinine, Ser  Date Value Ref Range Status  11/24/2021 0.83 0.76 - 1.27 mg/dL Final         Passed - Patient is not pregnant      Passed - Valid encounter within last 12 months    Recent Outpatient Visits           7 months ago Controlled type 2 diabetes mellitus without complication, without long-term current use of insulin (HCC)   [place] [facility]isk, [facility]   11 months ago Upper respiratory tract infection, unspecified type   [place] [facility] [place], [place], [place]   1 year ago Other constipation   [place] [facility] [place], Odette Horns, [place]   1 year ago Controlled type 2 diabetes mellitus without complication, without long-term current use of insulin (HCC)   [place] [facility] [place], [place] W, NP   1 year ago Controlled type 2 diabetes mellitus without complication, without long-term current use of insulin (HCC)   [place] [facility] [place], [place], MD               metFORMIN (GLUCOPHAGE-XR) 500 MG 24 hr tablet 90 tablet 0    Sig: Take 1 tablet (500 mg total) by mouth daily with breakfast.     Endocrinology:  Diabetes - Biguanides Failed - 07/13/2022  5:44 PM      Failed - HBA1C is between 0 and 7.9 and within 180 days    HbA1c, POC (controlled diabetic range)  Date Value Ref Range Status  11/24/2021 6.4 0.0 - 7.0 % Final         Failed - B12 Level in normal range and within 720 days    No results found for: "VITAMINB12"       Failed - Valid encounter within last 6 months    Recent Outpatient Visits           7 months ago Controlled type 2 diabetes mellitus without complication,  without long-term current use of insulin (HCC)   [place] [facility] [place] Storm Frisk, [place]   11 months ago Upper respiratory tract infection, unspecified type   [place] [facility] [place], [place], [place]   1 year ago Other constipation   [place] [facility] [place], Odette Horns, MD   1 year ago Controlled type 2 diabetes mellitus without complication, without long-term current use of insulin (HCC)   [place] [facility] [place], Shea Stakes, NP   1 year ago Controlled type 2 diabetes mellitus without complication, without long-term current use of insulin (HCC)   [place] [facility] [place], Odette Horns, MD              Failed - CBC within normal limits and completed in the last 12 months    WBC  Date Value Ref Range Status  01/23/2019 8.0 4.0 - 10.5 K/uL Final   RBC  Date Value Ref Range Status  01/23/2019 5.28 4.22 - 5.81 MIL/uL Final   Hemoglobin  Date Value Ref Range Status  01/23/2019  14.3 13.0 - 17.0 g/dL Final   HCT  Date Value Ref Range Status  01/23/2019 44.6 39.0 - 52.0 % Final   MCHC  Date Value Ref Range Status  01/23/2019 32.1 30.0 - 36.0 g/dL Final   [facility]  Date Value Ref Range Status  01/23/2019 27.1 26.0 - 34.0 pg Final   MCV  Date Value Ref Range Status  01/23/2019 84.5 80.0 - 100.0 fL Final   No results found for: "PLTCOUNTKUC", "LABPLAT", "POCPLA" RDW  Date Value Ref Range Status  01/23/2019 15.4 11.5 - 15.5 % Final         Passed - Cr in normal range and within 360 days    Creat  Date Value Ref Range Status  07/21/2015 0.79 0.70 - 1.25 mg/dL Final   Creatinine, Ser  Date Value Ref Range Status  11/24/2021 0.83 0.76 - 1.27 mg/dL Final         Passed - eGFR in normal range and within 360 days    GFR, Est African American  Date Value Ref Range Status  07/21/2015 >89 >=60 mL/min Final   GFR calc Af Amer  Date Value Ref Range Status  11/28/2019 104 >59 mL/min/1.73 Final    Comment:    **[facility] currently reports eGFR in compliance with the current**   recommendations of the [employer]. [facility] will   update reporting as new guidelines are published from the NKF-ASN   Task force.    GFR, Est Non African American  Date Value Ref Range Status  07/21/2015 >89 >=60 mL/min Final    Comment:      The estimated GFR is a calculation valid for adults (>=21 years old) that uses the CKD-EPI algorithm to adjust for age and sex. It is   not to be used for children, pregnant women, hospitalized patients,    patients on dialysis, or with rapidly changing kidney function. According to the NKDEP, eGFR >89 is normal, 60-89 shows mild impairment, 30-59 shows moderate impairment, 15-29 shows severe impairment and <15 is ESRD.      GFR calc non Af Amer  Date Value Ref Range Status  11/28/2019 90 >59 mL/min/1.73 Final   eGFR  Date Value Ref Range Status  11/24/2021 93 >59 mL/min/1.73 Final

## 2022-08-02 ENCOUNTER — Telehealth: Payer: Self-pay

## 2022-08-02 NOTE — Telephone Encounter (Signed)
Call placed in attempt to reach patient's son or daughter in law to  why the patient needs CAP services.  We need to determine if his medical conditions are chronic and severe enough to meet an institutional level of care.   Call back was requested

## 2022-08-17 ENCOUNTER — Other Ambulatory Visit: Payer: Self-pay

## 2022-08-17 ENCOUNTER — Other Ambulatory Visit (HOSPITAL_BASED_OUTPATIENT_CLINIC_OR_DEPARTMENT_OTHER): Payer: Self-pay

## 2022-08-17 ENCOUNTER — Other Ambulatory Visit: Payer: Self-pay | Admitting: Family Medicine

## 2022-08-17 DIAGNOSIS — E1169 Type 2 diabetes mellitus with other specified complication: Secondary | ICD-10-CM

## 2022-08-17 DIAGNOSIS — E119 Type 2 diabetes mellitus without complications: Secondary | ICD-10-CM

## 2022-09-02 ENCOUNTER — Ambulatory Visit: Payer: Self-pay

## 2022-09-02 NOTE — Telephone Encounter (Signed)
  Chief Complaint: Itchy rash Symptoms: above Frequency: April Pertinent Negatives: Patient denies  Disposition: [] ED /[x] Urgent Care (no appt availability in office) / [] Appointment(In office/virtual)/ []  [place] [facility]/ [] Home Care/ [] Refused Recommended Disposition /[] [place] [facility]/ []  Follow-up with PCP Additional Notes: Pt has had an itch rash since April. Pt was seen at UC, rash not resolved. No appt available until Sept. Pt took September appt and will go to [facility] care today.  Summary: Itching   The patient has been having itching all over for a while now. He was prescribed something a few months ago but it never helped. Please assist patient further as he is not sure what else to do and he cannot stop scrtaching     Reason for Disposition  [1] Cause unknown AND [2] present > 7 days  Protocols used: Itching - Localized-A-AH

## 2022-09-02 NOTE — Telephone Encounter (Signed)
Noted  

## 2022-09-07 ENCOUNTER — Other Ambulatory Visit: Payer: Self-pay | Admitting: Family Medicine

## 2022-09-07 ENCOUNTER — Other Ambulatory Visit: Payer: Self-pay

## 2022-09-07 DIAGNOSIS — E119 Type 2 diabetes mellitus without complications: Secondary | ICD-10-CM

## 2022-09-07 DIAGNOSIS — E1169 Type 2 diabetes mellitus with other specified complication: Secondary | ICD-10-CM

## 2022-09-07 MED ORDER — METFORMIN HCL ER 500 MG PO TB24
500.0000 mg | ORAL_TABLET | Freq: Every day | ORAL | 0 refills | Status: DC
Start: 2022-09-07 — End: 2022-11-02
  Filled 2022-09-07 – 2022-09-15 (×2): qty 90, 90d supply, fill #0

## 2022-09-07 MED ORDER — ROSUVASTATIN CALCIUM 20 MG PO TABS
20.0000 mg | ORAL_TABLET | Freq: Every day | ORAL | 0 refills | Status: DC
Start: 2022-09-07 — End: 2022-11-02
  Filled 2022-09-07 – 2022-09-15 (×2): qty 90, 90d supply, fill #0

## 2022-09-14 ENCOUNTER — Other Ambulatory Visit: Payer: Self-pay

## 2022-09-15 ENCOUNTER — Other Ambulatory Visit: Payer: Self-pay

## 2022-09-15 ENCOUNTER — Encounter (HOSPITAL_COMMUNITY): Payer: Self-pay | Admitting: Emergency Medicine

## 2022-09-15 ENCOUNTER — Ambulatory Visit (HOSPITAL_COMMUNITY)
Admission: EM | Admit: 2022-09-15 | Discharge: 2022-09-15 | Disposition: A | Discharge status: Home / Self Care | Payer: Medicare Other | Attending: Internal Medicine | Admitting: Internal Medicine

## 2022-09-15 ENCOUNTER — Ambulatory Visit (INDEPENDENT_AMBULATORY_CARE_PROVIDER_SITE_OTHER): Payer: Medicare Other

## 2022-09-15 DIAGNOSIS — K5901 Slow transit constipation: Secondary | ICD-10-CM | POA: Diagnosis not present

## 2022-09-15 DIAGNOSIS — B354 Tinea corporis: Secondary | ICD-10-CM

## 2022-09-15 MED ORDER — POLYETHYLENE GLYCOL 3350 17 G PO PACK
17.0000 g | PACK | Freq: Every day | ORAL | 0 refills | Status: DC | PRN
  Filled 2022-09-15: qty 30, 30d supply, fill #0

## 2022-09-15 MED ORDER — TERBINAFINE HCL 250 MG PO TABS
250.0000 mg | ORAL_TABLET | Freq: Every day | ORAL | 0 refills | Status: AC
  Filled 2022-09-15: qty 14, 14d supply, fill #0

## 2022-09-15 MED ORDER — SENNOSIDES-DOCUSATE SODIUM 8.6-50 MG PO TABS
1.0000 | ORAL_TABLET | Freq: Every day | ORAL | 1 refills | Status: AC
  Filled 2022-09-15: qty 90, 90d supply, fill #0

## 2022-09-15 NOTE — Discharge Instructions (Addendum)
Please increase fiber and vegetable intake Increase oral fluid intake Your x-rays shows moderate constipation Chest x-ray is without pneumonia If you have worsening shortness of breath or confusion please go to the emergency department to be evaluated If you have worsening abdominal pain or distention please go to the emergency room to be evaluated further.

## 2022-09-15 NOTE — ED Triage Notes (Signed)
History of constipation.  Family reports patient used to take miralax , and has run out.  Last bm was 3 days ago.    Patient also has a rash.  Rash is on abdomen, worse on right side than left side.  Rash itches

## 2022-09-15 NOTE — ED Provider Notes (Addendum)
[facility]    CSN: [id number] Arrival date & time: 09/15/22  1438      History   Chief Complaint Chief Complaint  Patient presents with   Constipation    HPI David Barber is a 72 y.o. male with a history of constipation currently taking MiraLAX comes to urgent care with 3-day history of constipation.  Patient has some abdominal fullness and bloating but denies any abdominal pain.  No nausea or vomiting.  Appetite is preserved.  Patient has ran out of MiraLAX over the past several days.  No weight loss.  No night sweats.  No fever or chills.  Patient had colonoscopy 2 years ago.  Colonoscopy was unremarkable.  On arrival in urgent care patient is close oximetry of 92%.  Patient is not a smoker.  He chews tobacco.  No shortness of breath or wheezing.  No cough or sputum production.  HPI  Past Medical History:  Diagnosis Date   Allergy    Anxiety    Asthma    Bronchitis    Cataract    COPD (chronic obstructive pulmonary disease) (HCC)    Diabetes mellitus without complication (HCC)    GERD (gastroesophageal reflux disease)    Hyperlipidemia    Hypertension     Patient Active Problem List   Diagnosis Date Noted   Constipation 11/24/2021   Diabetic neuropathy (HCC) 01/22/2018   Hyperlipidemia associated with type 2 diabetes mellitus (HCC) 07/29/2015   Hearing loss 07/29/2015   Vitamin D deficiency 05/15/2015   Chronic low back pain 02/11/2015   COPD, mild (HCC) 11/17/2014   Chewing tobacco use 11/17/2014   GERD (gastroesophageal reflux disease) 06/18/2014   Nonspecific abnormal electrocardiogram (ECG) (EKG) 06/16/2014   Diabetes type 2, controlled (HCC) 12/21/2012    Past Surgical History:  Procedure Laterality Date   COLONOSCOPY     CYST REMOVAL NECK  03/22/2011   benign   POLYPECTOMY     UPPER GASTROINTESTINAL ENDOSCOPY         Home Medications    Prior to Admission medications   Medication Sig Start Date End Date Taking?  Authorizing Provider  polyethylene glycol (MIRALAX) 17 g packet Take 17 g by mouth daily as needed for moderate constipation or mild constipation. 09/15/22  Yes Raveen Wieseler, Britta Mccreedy, MD  senna-docusate (SENOKOT-S) 8.6-50 MG tablet Take 1 tablet by mouth at bedtime. 09/15/22  Yes Karissa Meenan, Britta Mccreedy, MD  terbinafine (LAMISIL) 250 MG tablet Take 1 tablet (250 mg total) by mouth daily. 09/15/22  Yes Shealyn Sean, Britta Mccreedy, MD  albuterol (VENTOLIN HFA) 108 (90 Base) MCG/ACT inhaler INHALE 2 PUFFS INTO THE LUNGS EVERY 6 (SIX) HOURS AS NEEDED FOR WHEEZING OR SHORTNESS OF BREATH. 01/11/22   Marcine Matar, MD  glucose blood (ACCU-CHEK GUIDE) test strip Check blood sugar once a day. 10/12/20   Hoy Register, MD  hydrOXYzine (ATARAX) 25 MG tablet Take 1 tablet (25 mg total) by mouth at bedtime as needed For insomnia. 11/24/21   Storm Frisk, MD  metFORMIN (GLUCOPHAGE-XR) 500 MG 24 hr tablet Take 1 tablet (500 mg total) by mouth daily with breakfast. Must have office visit for refills 09/07/22   Hoy Register, MD  Misc. Devices MISC Discontinue oxygen 05/06/22   Hoy Register, MD  rosuvastatin (CRESTOR) 20 MG tablet Take 1 tablet (20 mg total) by mouth daily. Must have office visit for refills 09/07/22   Hoy Register, MD  tiotropium (SPIRIVA HANDIHALER) 18 MCG inhalation capsule Place 1 capsule (18 mcg  total) into inhaler and inhale daily. 07/14/22   Marcine Matar, MD  atorvastatin (LIPITOR) 80 MG tablet Take 1 tablet (80 mg total) by mouth daily. 05/27/20 05/29/20  Hoy Register, MD    Family History Family History  Problem Relation Age of Onset   Colon cancer Neg Hx    Colon polyps Neg Hx    Esophageal cancer Neg Hx    Rectal cancer Neg Hx    Stomach cancer Neg Hx     Social History Social History   Tobacco Use   Smoking status: Former    Types: Cigarettes    Quit date: 10/19/2013    Years since quitting: 8.9   Smokeless tobacco: Current    Types: Chew   Tobacco comments:    chewing  tobacco  Vaping Use   Vaping Use: Never used  Substance Use Topics   Alcohol use: Not Currently   Drug use: No     Allergies   Patient has no known allergies.   Review of Systems Review of Systems As per HPI.  Physical Exam Triage Vital Signs ED Triage Vitals  Enc Vitals Group     BP 09/15/22 1541 107/70     Pulse Rate 09/15/22 1541 83     Resp 09/15/22 1541 20     Temp 09/15/22 1541 (!) 97.5 F (36.4 C)     Temp Source 09/15/22 1541 Oral     SpO2 09/15/22 1541 93 %     Weight --      Height --      Head Circumference --      Peak Flow --      Pain Score 09/15/22 1538 0     Pain Loc --      Pain Edu? --      Excl. in GC? --    No data found.  Updated Vital Signs BP 107/70 (BP Location: Right Arm)   Pulse 83   Temp (!) 97.5 F (36.4 C) (Oral)   Resp 20   SpO2 92% Comment: a different piece of equipment  Visual Acuity Right Eye Distance:   Left Eye Distance:   Bilateral Distance:    Right Eye Near:   Left Eye Near:    Bilateral Near:     Physical Exam Vitals and nursing note reviewed.  Constitutional:      General: He is not in acute distress.    Appearance: He is not ill-appearing.  Cardiovascular:     Rate and Rhythm: Normal rate and regular rhythm.     Pulses: Normal pulses.     Heart sounds: Normal heart sounds.  Pulmonary:     Effort: Pulmonary effort is normal.     Breath sounds: Normal breath sounds.  Abdominal:     General: Bowel sounds are normal. There is no distension.     Palpations: Abdomen is soft. There is no mass.     Tenderness: There is no abdominal tenderness. There is no guarding.     Hernia: No hernia is present.  Neurological:     Mental Status: He is alert.      UC Treatments / Results  Labs (all labs ordered are listed, but only abnormal results are displayed) Labs Reviewed - No data to display  EKG   Radiology DG Chest 2 View  Result Date: 09/15/2022 CLINICAL DATA:  Hypoxemia EXAM: CHEST - 2 VIEW  COMPARISON:  11/08/2019 FINDINGS: Frontal and lateral views of the chest demonstrate an unremarkable cardiac silhouette.  No acute airspace disease, effusion, or pneumothorax. No acute bony abnormalities. IMPRESSION: 1. No acute intrathoracic process. Electronically Signed   By: Sharlet Salina M.D.   On: 09/15/2022 16:38   DG Abd 1 View  Result Date: 09/15/2022 CLINICAL DATA:  Constipation, abdominal rash EXAM: ABDOMEN - 1 VIEW COMPARISON:  None Available. FINDINGS: 2 upright frontal views of the abdomen and pelvis are obtained, excluding the right hemidiaphragm by collimation. No bowel obstruction or ileus. Moderate retained stool within the distal colon. No masses or abnormal calcifications. No acute bony abnormalities. IMPRESSION: 1. Moderate retained stool throughout the colon consistent with constipation. 2. No obstruction or ileus. Electronically Signed   By: Sharlet Salina M.D.   On: 09/15/2022 16:38    Procedures Procedures (including critical care time)  Medications Ordered in UC Medications - No data to display  Initial Impression / Assessment and Plan / UC Course  I have reviewed the triage vital signs and the nursing notes.  Pertinent labs & imaging results that were available during my care of the patient were reviewed by me and considered in my medical decision making (see chart for details).     1.  Slow transit constipation: Senna 1 tablet orally daily MiraLAX 17 g orally daily as needed for constipation if no bowel movement in 2 to 3 days Patient is advised to increase oral fluid intake as well as vegetables/fruit intake KUB is consistent with marked constipation.  2.  Low normal pulse oximetry reading likely secondary to COPD: Chest x-ray is negative for acute lung infiltrate Patient is not short of breath.  Pulse oximetry is between 91-94%. Return precautions given. Final Clinical Impressions(s) / UC Diagnoses   Final diagnoses:  Slow transit constipation  Tinea  corporis     Discharge Instructions      Please increase fiber and vegetable intake Increase oral fluid intake Your x-rays shows moderate constipation Chest x-ray is without pneumonia If you have worsening shortness of breath or confusion please go to the emergency department to be evaluated If you have worsening abdominal pain or distention please go to the emergency room to be evaluated further.   ED Prescriptions     Medication Sig Dispense Auth. Provider   terbinafine (LAMISIL) 250 MG tablet Take 1 tablet (250 mg total) by mouth daily. 14 tablet Georgios Kina, Britta Mccreedy, MD   polyethylene glycol (MIRALAX) 17 g packet Take 17 g by mouth daily as needed for moderate constipation or mild constipation. 30 each Indira Sorenson, Britta Mccreedy, MD   senna-docusate (SENOKOT-S) 8.6-50 MG tablet Take 1 tablet by mouth at bedtime. 90 tablet Sophiea Ueda, Britta Mccreedy, MD      PDMP not reviewed this encounter.   Merrilee Jansky, MD 09/15/22 Si Gaul    Merrilee Jansky, MD 09/15/22 [phone]

## 2022-09-16 ENCOUNTER — Other Ambulatory Visit: Payer: Self-pay

## 2022-11-02 ENCOUNTER — Other Ambulatory Visit: Payer: Self-pay

## 2022-11-02 ENCOUNTER — Ambulatory Visit: Payer: Medicare Other | Attending: Family Medicine | Admitting: Family Medicine

## 2022-11-02 ENCOUNTER — Encounter: Payer: Self-pay | Admitting: Family Medicine

## 2022-11-02 VITALS — BP 138/78 | HR 83 | Ht 66.0 in | Wt 172.6 lb

## 2022-11-02 DIAGNOSIS — Z79899 Other long term (current) drug therapy: Secondary | ICD-10-CM | POA: Diagnosis not present

## 2022-11-02 DIAGNOSIS — J449 Chronic obstructive pulmonary disease, unspecified: Secondary | ICD-10-CM

## 2022-11-02 DIAGNOSIS — L299 Pruritus, unspecified: Secondary | ICD-10-CM | POA: Diagnosis not present

## 2022-11-02 DIAGNOSIS — Z7984 Long term (current) use of oral hypoglycemic drugs: Secondary | ICD-10-CM | POA: Diagnosis not present

## 2022-11-02 DIAGNOSIS — E119 Type 2 diabetes mellitus without complications: Secondary | ICD-10-CM

## 2022-11-02 DIAGNOSIS — M1611 Unilateral primary osteoarthritis, right hip: Secondary | ICD-10-CM | POA: Diagnosis not present

## 2022-11-02 DIAGNOSIS — Z760 Encounter for issue of repeat prescription: Secondary | ICD-10-CM | POA: Insufficient documentation

## 2022-11-02 DIAGNOSIS — F419 Anxiety disorder, unspecified: Secondary | ICD-10-CM | POA: Diagnosis not present

## 2022-11-02 DIAGNOSIS — E1136 Type 2 diabetes mellitus with diabetic cataract: Secondary | ICD-10-CM | POA: Insufficient documentation

## 2022-11-02 DIAGNOSIS — E1169 Type 2 diabetes mellitus with other specified complication: Secondary | ICD-10-CM

## 2022-11-02 DIAGNOSIS — Z8616 Personal history of COVID-19: Secondary | ICD-10-CM | POA: Insufficient documentation

## 2022-11-02 DIAGNOSIS — M25551 Pain in right hip: Secondary | ICD-10-CM | POA: Diagnosis not present

## 2022-11-02 DIAGNOSIS — J4489 Other specified chronic obstructive pulmonary disease: Secondary | ICD-10-CM | POA: Diagnosis not present

## 2022-11-02 DIAGNOSIS — E785 Hyperlipidemia, unspecified: Secondary | ICD-10-CM

## 2022-11-02 DIAGNOSIS — E781 Pure hyperglyceridemia: Secondary | ICD-10-CM | POA: Insufficient documentation

## 2022-11-02 DIAGNOSIS — L309 Dermatitis, unspecified: Secondary | ICD-10-CM | POA: Insufficient documentation

## 2022-11-02 LAB — POCT GLYCOSYLATED HEMOGLOBIN (HGB A1C): HbA1c, POC (controlled diabetic range): 6.7 % (ref 0.0–7.0)

## 2022-11-02 MED ORDER — HYDROXYZINE HCL 25 MG PO TABS
25.0000 mg | ORAL_TABLET | Freq: Three times a day (TID) | ORAL | 1 refills | Status: AC | PRN
Start: 2022-11-02 — End: ?
  Filled 2022-11-02: qty 30, 10d supply, fill #0

## 2022-11-02 MED ORDER — TRIAMCINOLONE ACETONIDE 0.1 % EX CREA
1.0000 | TOPICAL_CREAM | Freq: Two times a day (BID) | CUTANEOUS | 0 refills | Status: DC
Start: 2022-11-02 — End: 2023-04-12
  Filled 2022-11-02: qty 30, 15d supply, fill #0

## 2022-11-02 MED ORDER — METFORMIN HCL ER 500 MG PO TB24
500.0000 mg | ORAL_TABLET | Freq: Every day | ORAL | 1 refills | Status: DC
Start: 2022-11-02 — End: 2023-04-12
  Filled 2022-11-02 – 2023-01-25 (×3): qty 90, 90d supply, fill #0

## 2022-11-02 MED ORDER — ALBUTEROL SULFATE HFA 108 (90 BASE) MCG/ACT IN AERS
2.0000 | INHALATION_SPRAY | Freq: Four times a day (QID) | RESPIRATORY_TRACT | 6 refills | Status: DC | PRN
Start: 2022-11-02 — End: 2023-09-27
  Filled 2022-11-02: qty 36, 50d supply, fill #0

## 2022-11-02 MED ORDER — ROSUVASTATIN CALCIUM 20 MG PO TABS
20.0000 mg | ORAL_TABLET | Freq: Every day | ORAL | 1 refills | Status: DC
Start: 2022-11-02 — End: 2023-04-12
  Filled 2022-11-02 – 2022-12-22 (×2): qty 90, 90d supply, fill #0

## 2022-11-02 MED ORDER — TIOTROPIUM BROMIDE MONOHYDRATE 18 MCG IN CAPS
1.0000 | ORAL_CAPSULE | Freq: Every day | RESPIRATORY_TRACT | 1 refills | Status: DC
Start: 2022-11-02 — End: 2023-04-12
  Filled 2022-11-02: qty 90, 90d supply, fill #0

## 2022-11-02 MED ORDER — MELOXICAM 7.5 MG PO TABS
7.5000 mg | ORAL_TABLET | Freq: Every day | ORAL | 1 refills | Status: AC
Start: 2022-11-02 — End: ?
  Filled 2022-11-02: qty 30, 30d supply, fill #0

## 2022-11-02 NOTE — Patient Instructions (Signed)
Pruritus Pruritus is an itchy feeling on the skin. One of the most common causes is dry skin, but many different things can cause itching. Most cases of itching do not require medical attention. Sometimes itchy skin can turn into a rash or a secondary infection. Follow these instructions at home: Skin care  Do not use scented soaps, detergents, perfumes, and cosmetic products. Instead, use gentle, unscented versions of these items. Apply moisturizing creams to your skin frequently, at least twice daily. Apply immediately after bathing while skin is still wet. Take medicines or apply medicated creams only as told by your health care provider. This may include: Corticosteroid cream or topical calcineurin inhibitor. Anti-itch lotions containing urea, camphor, or menthol. Oral antihistamines. Do not take hot showers or baths, which can make itching worse. A short, cool shower may help with itching as long as you apply moisturizing lotion after the shower. Apply a cool, wet cloth (cool compress) to the affected areas. You may take lukewarm baths with one of the following: Epsom salts. You can get these at your local pharmacy or grocery store. Follow the instructions on the packaging. Baking soda. Pour a small amount into the bath as told by your health care provider. Colloidal oatmeal. You can get this at your local pharmacy or grocery store. Follow the instructions on the packaging. Do not scratch your skin. General instructions Avoid wearing tight clothes. Keep a journal to help find out what is causing your itching. Write down: What you eat and drink. What cosmetic products you use. What soaps or detergents you use. What you wear, including jewelry. Use a humidifier. This keeps the air moist, which helps to prevent dry skin. Be aware of any changes in your itchiness. Tell your health care provider about any changes. Contact a health care provider if: The itching does not go away after  several days. You notice redness, warmth, or drainage on the skin where you have scratched. You are unusually thirsty or urinating more than normal. Your skin tingles or feels numb. Your skin or the white parts of your eyes turn yellow (jaundice). You feel weak. You have any of the following: Night sweats. Tiredness (fatigue). Weight loss. Abdominal pain. Summary Pruritus is an itchy feeling on the skin. One of the most common causes is dry skin, but many different conditions and factors can cause itching. Apply moisturizing creams to your skin frequently, at least twice daily. Apply immediately after bathing while skin is still wet. Take medicines or apply medicated creams only as told by your health care provider. Do not take hot showers or baths. Do not use scented soaps, detergents, perfumes, or cosmetic products. Keep a journal to help find out what is causing your itching. This information is not intended to replace advice given to you by your health care provider. Make sure you discuss any questions you have with your health care provider. Document Revised: 04/14/2021 Document Reviewed: 04/14/2021 Elsevier Patient Education  2024 [employer].

## 2022-11-02 NOTE — Progress Notes (Signed)
Subjective:  Patient ID: David Barber, male    DOB: [birth year]  Age: 72 y.o. MRN: [id number]  CC: Leg Pain (Pain in right leg/Itching all over body)   HPI David Barber is a 72 y.o. year old male with a history of COPD, type 2 diabetes mellitus (A1c 6.2), hyperlipidemia COPD,  here for chronic disease management.tobacco use (chews tobacco), previous history of COVID     Interval History: Discussed the use of AI scribe software for clinical note transcription with the patient, who gave verbal consent to proceed.  He presents with right hip joint pain and body itchiness. The hip pain started a few days ago and was severe, but has since improved to a level of 3-4 out of 10. The patient did not take any medication for the pain, but requests a prescription for future use if needed.  There were no triggers for his hip pain and pain was worse with walking, did not radiate down his extremity.  The patient also reports a history of body itching that started five years ago and lasted for several years. The itching returned three months ago and, while it has improved, it is still present. The itching was initially worse at night, causing sleep disturbances, but is now present both day and night. The patient also reports having a rash associated with the itchiness.  The patient's COPD is reportedly controlled with no exacerbations, and he uses his Spiriva inhaler every morning.  The patient also has a history of diabetes, which has been well controlled with metformin. The patient's last A1c was 6.7.        Past Medical History:  Diagnosis Date   Allergy    Anxiety    Asthma    Bronchitis    Cataract    COPD (chronic obstructive pulmonary disease) (HCC)    Diabetes mellitus without complication (HCC)    GERD (gastroesophageal reflux disease)    Hyperlipidemia    Hypertension     Past Surgical History:  Procedure Laterality Date   COLONOSCOPY     CYST REMOVAL NECK  03/22/2011    benign   POLYPECTOMY     UPPER GASTROINTESTINAL ENDOSCOPY      Family History  Problem Relation Age of Onset   Colon cancer Neg Hx    Colon polyps Neg Hx    Esophageal cancer Neg Hx    Rectal cancer Neg Hx    Stomach cancer Neg Hx     Social History   Socioeconomic History   Marital status: Married    Spouse name: Not on file   Number of children: Not on file   Years of education: Not on file   Highest education level: Not on file  Occupational History   Not on file  Tobacco Use   Smoking status: Former    Current packs/day: 0.00    Types: Cigarettes    Quit date: 10/19/2013    Years since quitting: 9.0   Smokeless tobacco: Current    Types: Chew   Tobacco comments:    chewing tobacco  Vaping Use   Vaping status: Never Used  Substance and Sexual Activity   Alcohol use: Not Currently   Drug use: No   Sexual activity: Not on file  Other Topics Concern   Not on file  Social History Narrative   Not on file   Social Determinants of Health   Financial Resource Strain: Low Risk  (04/28/2022)   Overall Financial Resource Strain (CARDIA)  Difficulty of Paying Living Expenses: Not hard at all  Food Insecurity: No Food Insecurity (04/28/2022)   Hunger Vital Sign    Worried About Running Out of Food in the Last Year: Never true    Ran Out of Food in the Last Year: Never true  Transportation Needs: No Transportation Needs (04/28/2022)   PRAPARE - Administrator, Civil Service (Medical): No    Lack of Transportation (Non-Medical): No  Physical Activity: Insufficiently Active (04/28/2022)   Exercise Vital Sign    Days of Exercise per Week: 7 days    Minutes of Exercise per Session: 20 min  Stress: No Stress Concern Present (04/28/2022)   [employer] of Occupational Health - Occupational Stress Questionnaire    Feeling of Stress : Not at all  Social Connections: Not on file    No Known Allergies  Outpatient Medications Prior to Visit  Medication Sig  Dispense Refill   glucose blood (ACCU-CHEK GUIDE) test strip Check blood sugar once a day. 100 each 2   Misc. Devices MISC Discontinue oxygen 1 each 0   polyethylene glycol (MIRALAX) 17 g packet Take 17 g by mouth daily as needed for moderate constipation or mild constipation. 30 each 0   senna-docusate (SENOKOT-S) 8.6-50 MG tablet Take 1 tablet by mouth at bedtime. 90 tablet 1   terbinafine (LAMISIL) 250 MG tablet Take 1 tablet (250 mg total) by mouth daily. 14 tablet 0   albuterol (VENTOLIN HFA) 108 (90 Base) MCG/ACT inhaler INHALE 2 PUFFS INTO THE LUNGS EVERY 6 (SIX) HOURS AS NEEDED FOR WHEEZING OR SHORTNESS OF BREATH. 36 g 6   hydrOXYzine (ATARAX) 25 MG tablet Take 1 tablet (25 mg total) by mouth at bedtime as needed For insomnia. 30 tablet 0   metFORMIN (GLUCOPHAGE-XR) 500 MG 24 hr tablet Take 1 tablet (500 mg total) by mouth daily with breakfast. Must have office visit for refills 90 tablet 0   rosuvastatin (CRESTOR) 20 MG tablet Take 1 tablet (20 mg total) by mouth daily. Must have office visit for refills 90 tablet 0   tiotropium (SPIRIVA HANDIHALER) 18 MCG inhalation capsule Place 1 capsule (18 mcg total) into inhaler and inhale daily. 90 capsule 0   No facility-administered medications prior to visit.     ROS Review of Systems  Constitutional:  Negative for activity change and appetite change.  HENT:  Negative for sinus pressure and sore throat.   Respiratory:  Negative for chest tightness, shortness of breath and wheezing.   Cardiovascular:  Negative for chest pain and palpitations.  Gastrointestinal:  Negative for abdominal distention, abdominal pain and constipation.  Genitourinary: Negative.   Musculoskeletal:        See HPI  Skin:  Positive for rash.  Psychiatric/Behavioral:  Negative for behavioral problems and dysphoric mood.     Objective:  BP 138/78   Pulse 83   Ht 5\' 6"  (1.676 m)   Wt 172 lb 9.6 oz (78.3 kg)   SpO2 96%   BMI 27.86 kg/m      11/02/2022     2:04 PM 09/15/2022    3:41 PM 07/02/2022    3:56 PM  BP/Weight  Systolic BP 138 107 108  Diastolic BP 78 70 72  Wt. (Lbs) 172.6  175.05  BMI 27.86 kg/m2  28.25 kg/m2      Physical Exam Constitutional:      Appearance: He is well-developed.  Cardiovascular:     Rate and Rhythm: Normal rate.  Heart sounds: Normal heart sounds. No murmur heard. Pulmonary:     Effort: Pulmonary effort is normal.     Breath sounds: Normal breath sounds. No wheezing or rales.  Chest:     Chest wall: No tenderness.  Abdominal:     General: Bowel sounds are normal. There is no distension.     Palpations: Abdomen is soft. There is no mass.     Tenderness: There is no abdominal tenderness.  Musculoskeletal:        General: Normal range of motion.     Right lower leg: No edema.     Left lower leg: No edema.  Skin:    Comments: Dry scaly rash on right side of abdomen and left lower back  Neurological:     Mental Status: He is alert and oriented to person, place, and time.  Psychiatric:        Mood and Affect: Mood normal.        Latest Ref Rng & Units 11/24/2021    9:13 AM 06/16/2021    4:49 PM 05/28/2020    9:56 AM  CMP  Glucose 70 - 99 mg/dL 478  94  97   BUN 8 - 27 mg/dL 8  8  8    Creatinine 0.76 - 1.27 mg/dL 2.95  6.21  3.08   Sodium 134 - 144 mmol/L 143  143  144   Potassium 3.5 - 5.2 mmol/L 4.2  4.4  5.2   Chloride 96 - 106 mmol/L 106  105  104   CO2 20 - 29 mmol/L 23  25  25    Calcium 8.6 - 10.2 mg/dL 9.8  9.7  65.7   Total Protein 6.0 - 8.5 g/dL  7.1  7.4   Total Bilirubin 0.0 - 1.2 mg/dL  0.4  0.6   Alkaline Phos 44 - 121 IU/L  117  122   AST 0 - 40 IU/L  24  23   ALT 0 - 44 IU/L  25  23     Lipid Panel     Component Value Date/Time   CHOL 141 11/24/2021 0913   TRIG 183 (H) 11/24/2021 0913   HDL 36 (L) 11/24/2021 0913   CHOLHDL 3.9 11/24/2021 0913   CHOLHDL 6.5 (H) 07/21/2015 1625   VLDL 61 (H) 07/21/2015 1625   LDLCALC 74 11/24/2021 0913    CBC    Component Value  Date/Time   WBC 8.0 01/23/2019 0140   RBC 5.28 01/23/2019 0140   HGB 14.3 01/23/2019 0140   HCT 44.6 01/23/2019 0140   PLT 149 (L) 01/23/2019 0140   MCV 84.5 01/23/2019 0140   MCH 27.1 01/23/2019 0140   MCHC 32.1 01/23/2019 0140   RDW 15.4 01/23/2019 0140   LYMPHSABS 0.9 01/23/2019 0140   MONOABS 0.2 01/23/2019 0140   EOSABS 0.0 01/23/2019 0140   BASOSABS 0.0 01/23/2019 0140    Lab Results  Component Value Date   HGBA1C 6.7 11/02/2022    Assessment & Plan:      Right Hip Pain Recent onset, improved but still present. Likely arthritis based on physical exam. -Prescribe Meloxicam for pain relief.  Pruritus with Rash Unspecified dermatitis Recurrent, improved but still present. No new exposures identified. Rash noted on physical exam. -Prescribe Triamcinolone cream for topical application. -Prescribe Hydroxyzine for symptomatic relief.  COPD Stable, using Spiriva inhaler daily. -Continue current inhaler regimen.  Hyperlipidemia On Crestor. Slightly elevated triglycerides from 12/2021 -Continue Crestor. -Will recheck lipid panel at a subsequent visit  Type 2 Diabetes Mellitus Well controlled with Metformin, recent A1c 6.7. -Continue Metformin. -Continue diabetic diet, exercise, annual eye exams, and annual foot exams.   General Health Maintenance -Order blood tests and communicate results by next week.          Meds ordered this encounter  Medications   hydrOXYzine (ATARAX) 25 MG tablet    Sig: Take 1 tablet (25 mg total) by mouth every 8 (eight) hours as needed for itching.    Dispense:  30 tablet    Refill:  1   albuterol (VENTOLIN HFA) 108 (90 Base) MCG/ACT inhaler    Sig: INHALE 2 PUFFS INTO THE LUNGS EVERY 6 (SIX) HOURS AS NEEDED FOR WHEEZING OR SHORTNESS OF BREATH.    Dispense:  36 g    Refill:  6   metFORMIN (GLUCOPHAGE-XR) 500 MG 24 hr tablet    Sig: Take 1 tablet (500 mg total) by mouth daily with breakfast.    Dispense:  90 tablet     Refill:  1    Must have office visit for refills   rosuvastatin (CRESTOR) 20 MG tablet    Sig: Take 1 tablet (20 mg total) by mouth daily. Must have office visit for refills    Dispense:  90 tablet    Refill:  1   tiotropium (SPIRIVA HANDIHALER) 18 MCG inhalation capsule    Sig: Place 1 capsule (18 mcg total) into inhaler and inhale daily.    Dispense:  90 capsule    Refill:  1   meloxicam (MOBIC) 7.5 MG tablet    Sig: Take 1 tablet (7.5 mg total) by mouth daily.    Dispense:  30 tablet    Refill:  1   triamcinolone cream (KENALOG) 0.1 %    Sig: Apply 1 Application topically 2 (two) times daily.    Dispense:  30 g    Refill:  0    Follow-up: Return in about 6 months (around 05/05/2023) for Chronic medical conditions.       Hoy Register, MD, FAAFP. [facility] [place], [place] [phone]   11/02/2022, 5:45 PM

## 2022-11-03 LAB — CMP14+EGFR
ALT: 25 IU/L (ref 0–44)
AST: 28 IU/L (ref 0–40)
Albumin: 4.3 g/dL (ref 3.8–4.8)
Alkaline Phosphatase: 122 IU/L — ABNORMAL HIGH (ref 44–121)
BUN/Creatinine Ratio: 6 — ABNORMAL LOW (ref 10–24)
BUN: 5 mg/dL — ABNORMAL LOW (ref 8–27)
Bilirubin Total: 0.4 mg/dL (ref 0.0–1.2)
CO2: 24 mmol/L (ref 20–29)
Calcium: 9.5 mg/dL (ref 8.6–10.2)
Chloride: 103 mmol/L (ref 96–106)
Creatinine, Ser: 0.84 mg/dL (ref 0.76–1.27)
Globulin, Total: 3.2 g/dL (ref 1.5–4.5)
Glucose: 111 mg/dL — ABNORMAL HIGH (ref 70–99)
Potassium: 4 mmol/L (ref 3.5–5.2)
Sodium: 141 mmol/L (ref 134–144)
Total Protein: 7.5 g/dL (ref 6.0–8.5)
eGFR: 92 mL/min/{1.73_m2} (ref >59)

## 2022-11-03 LAB — MICROALBUMIN / CREATININE URINE RATIO
Creatinine, Urine: 70.1 mg/dL
Microalb/Creat Ratio: <4 mg/g{creat} (ref 0–29)
Microalbumin, Urine: <3 ug/mL

## 2022-11-09 ENCOUNTER — Other Ambulatory Visit: Payer: Self-pay

## 2022-11-30 ENCOUNTER — Ambulatory Visit: Payer: Medicare Other | Admitting: Family Medicine

## 2022-12-22 ENCOUNTER — Other Ambulatory Visit: Payer: Self-pay

## 2023-01-03 ENCOUNTER — Other Ambulatory Visit: Payer: Self-pay

## 2023-01-25 ENCOUNTER — Other Ambulatory Visit: Payer: Self-pay

## 2023-01-26 ENCOUNTER — Other Ambulatory Visit: Payer: Self-pay

## 2023-02-03 ENCOUNTER — Other Ambulatory Visit: Payer: Self-pay

## 2023-04-12 ENCOUNTER — Ambulatory Visit: Payer: Medicare Other | Attending: Physician Assistant | Admitting: Physician Assistant

## 2023-04-12 ENCOUNTER — Encounter: Payer: Self-pay | Admitting: Physician Assistant

## 2023-04-12 ENCOUNTER — Telehealth: Payer: Self-pay | Admitting: Pharmacist

## 2023-04-12 ENCOUNTER — Other Ambulatory Visit: Payer: Self-pay

## 2023-04-12 VITALS — BP 137/85 | HR 98 | Wt 170.6 lb

## 2023-04-12 DIAGNOSIS — E1169 Type 2 diabetes mellitus with other specified complication: Secondary | ICD-10-CM | POA: Diagnosis not present

## 2023-04-12 DIAGNOSIS — J4489 Other specified chronic obstructive pulmonary disease: Secondary | ICD-10-CM | POA: Diagnosis not present

## 2023-04-12 DIAGNOSIS — Z7984 Long term (current) use of oral hypoglycemic drugs: Secondary | ICD-10-CM | POA: Diagnosis not present

## 2023-04-12 DIAGNOSIS — E785 Hyperlipidemia, unspecified: Secondary | ICD-10-CM | POA: Diagnosis not present

## 2023-04-12 DIAGNOSIS — L308 Other specified dermatitis: Secondary | ICD-10-CM

## 2023-04-12 DIAGNOSIS — J449 Chronic obstructive pulmonary disease, unspecified: Secondary | ICD-10-CM

## 2023-04-12 DIAGNOSIS — Z79899 Other long term (current) drug therapy: Secondary | ICD-10-CM | POA: Diagnosis not present

## 2023-04-12 DIAGNOSIS — L299 Pruritus, unspecified: Secondary | ICD-10-CM

## 2023-04-12 DIAGNOSIS — L309 Dermatitis, unspecified: Secondary | ICD-10-CM | POA: Insufficient documentation

## 2023-04-12 DIAGNOSIS — Z758 Other problems related to medical facilities and other health care: Secondary | ICD-10-CM

## 2023-04-12 DIAGNOSIS — E119 Type 2 diabetes mellitus without complications: Secondary | ICD-10-CM

## 2023-04-12 DIAGNOSIS — Z603 Acculturation difficulty: Secondary | ICD-10-CM

## 2023-04-12 MED ORDER — CETIRIZINE HCL 10 MG PO TABS
10.0000 mg | ORAL_TABLET | Freq: Every day | ORAL | 11 refills | Status: AC
Start: 2023-04-12 — End: ?
  Filled 2023-04-12: qty 30, 30d supply, fill #0

## 2023-04-12 MED ORDER — TRIAMCINOLONE ACETONIDE 0.1 % EX CREA
1.0000 | TOPICAL_CREAM | Freq: Two times a day (BID) | CUTANEOUS | 1 refills | Status: AC
Start: 2023-04-12 — End: ?
  Filled 2023-04-12: qty 80, 30d supply, fill #0
  Filled 2023-08-07: qty 80, 30d supply, fill #1

## 2023-04-12 MED ORDER — ROSUVASTATIN CALCIUM 20 MG PO TABS
20.0000 mg | ORAL_TABLET | Freq: Every day | ORAL | 1 refills | Status: DC
Start: 2023-04-12 — End: 2023-09-27
  Filled 2023-04-12: qty 90, 90d supply, fill #0

## 2023-04-12 MED ORDER — TIOTROPIUM BROMIDE MONOHYDRATE 18 MCG IN CAPS
1.0000 | ORAL_CAPSULE | Freq: Every day | RESPIRATORY_TRACT | 2 refills | Status: DC
Start: 2023-04-12 — End: 2023-04-13
  Filled 2023-04-12: qty 30, 30d supply, fill #0

## 2023-04-12 MED ORDER — METFORMIN HCL ER 500 MG PO TB24
500.0000 mg | ORAL_TABLET | Freq: Two times a day (BID) | ORAL | 1 refills | Status: DC
Start: 2023-04-12 — End: 2023-09-27
  Filled 2023-04-12: qty 180, 90d supply, fill #0

## 2023-04-12 NOTE — Telephone Encounter (Signed)
Received note from pharmacy that Spiriva is no longer covered. He has been on this for some time. I wanted to ask about changing to Incruse before automatically making the switch.

## 2023-04-12 NOTE — Progress Notes (Signed)
Patient ID: David Barber, male   DOB: 1950-05-06, 73 y.o.   MRN: [id number]   David Barber, is a 73 y.o. male  YQM:[id number]  BMW:[id number]  DOB - Jun 15, 1950  Chief Complaint  Patient presents with   Rash    Patient states he had the rash for on and off the last year on his stomach and wrist        Subjective:   David Barber is a 72 y.o. male here today for med RF and continued problems with rash on his arms and abdomen.  Prednisone has helped.  Steroid creams help a little.  He is due for A1C.  He says he takes all his meds but they show as "never being dispensed."  Rash has been present for years.  Meds help temporarily but nothing makes it go away.    No problems updated.  ALLERGIES: No Known Allergies  PAST MEDICAL HISTORY: Past Medical History:  Diagnosis Date   Allergy    Anxiety    Asthma    Bronchitis    Cataract    COPD (chronic obstructive pulmonary disease) (HCC)    Diabetes mellitus without complication (HCC)    GERD (gastroesophageal reflux disease)    Hyperlipidemia    Hypertension     MEDICATIONS AT HOME: Prior to Admission medications   Medication Sig Start Date End Date Taking? Authorizing Provider  albuterol (VENTOLIN HFA) 108 (90 Base) MCG/ACT inhaler INHALE 2 PUFFS INTO THE LUNGS EVERY 6 (SIX) HOURS AS NEEDED FOR WHEEZING OR SHORTNESS OF BREATH. 11/02/22  Yes Newlin, Enobong, MD  cetirizine (ZYRTEC) 10 MG tablet Take 1 tablet (10 mg total) by mouth daily. 04/12/23  Yes Georgian Co M, PA-C  glucose blood (ACCU-CHEK GUIDE) test strip Check blood sugar once a day. 10/12/20   Hoy Register, MD  hydrOXYzine (ATARAX) 25 MG tablet Take 1 tablet (25 mg total) by mouth every 8 (eight) hours as needed for itching. 11/02/22   Hoy Register, MD  meloxicam (MOBIC) 7.5 MG tablet Take 1 tablet (7.5 mg total) by mouth daily. 11/02/22   Hoy Register, MD  metFORMIN (GLUCOPHAGE-XR) 500 MG 24 hr tablet Take 1 tablet (500 mg total) by mouth 2 (two) times daily  with a meal. 04/12/23   Anders Simmonds, PA-C  Misc. Devices MISC Discontinue oxygen 05/06/22   Hoy Register, MD  polyethylene glycol (MIRALAX) 17 g packet Take 17 g by mouth daily as needed for moderate constipation or mild constipation. 09/15/22   Lamptey, Britta Mccreedy, MD  rosuvastatin (CRESTOR) 20 MG tablet Take 1 tablet (20 mg total) by mouth daily. 04/12/23   Anders Simmonds, PA-C  senna-docusate (SENOKOT-S) 8.6-50 MG tablet Take 1 tablet by mouth at bedtime. 09/15/22   Merrilee Jansky, MD  terbinafine (LAMISIL) 250 MG tablet Take 1 tablet (250 mg total) by mouth daily. 09/15/22   Merrilee Jansky, MD  tiotropium (SPIRIVA HANDIHALER) 18 MCG inhalation capsule Place 1 capsule (18 mcg total) into inhaler and inhale daily. 04/12/23   Anders Simmonds, PA-C  triamcinolone cream (KENALOG) 0.1 % Apply 1 Application topically 2 (two) times daily. 04/12/23   Anders Simmonds, PA-C  atorvastatin (LIPITOR) 80 MG tablet Take 1 tablet (80 mg total) by mouth daily. 05/27/20 05/29/20  Hoy Register, MD    ROS: Neg HEENT Neg resp Neg cardiac Neg GI Neg GU Neg MS Neg psych Neg neuro  Objective:   Vitals:   04/12/23 1538  BP: 137/85  Pulse: 98  SpO2: 92%  Weight: 170 lb 9.6 oz (77.4 kg)   Exam General appearance : Awake, alert, not in any distress. Speech Clear. Not toxic looking HEENT: Atraumatic and Normocephalic, pupils equally reactive to light and accomodation Neck: Supple, no JVD. No cervical lymphadenopathy.  Chest: Good air entry bilaterally, CTAB.  No rales/rhonchi/wheezing CVS: S1 S2 regular, no murmurs.  Extremities: B/L Lower Ext shows no edema, both legs are warm to touch Neurology: Awake alert, and oriented X 3, CN II-XII intact, Non focal Skin: eczematous type rash on abdomen and flexor surfaces of arms/wrists.    Data Review Lab Results  Component Value Date   HGBA1C 6.7 11/02/2022   HGBA1C 6.4 11/24/2021   HGBA1C 6.2 06/16/2021    Assessment & Plan   1. COPD,  mild (HCC) stable - tiotropium (SPIRIVA HANDIHALER) 18 MCG inhalation capsule; Place 1 capsule (18 mcg total) into inhaler and inhale daily.  Dispense: 90 capsule; Refill: 2  2. Controlled type 2 diabetes mellitus without complication, without long-term current use of insulin (HCC) Increase dose to bid - Hemoglobin A1c - metFORMIN (GLUCOPHAGE-XR) 500 MG 24 hr tablet; Take 1 tablet (500 mg total) by mouth 2 (two) times daily with a meal.  Dispense: 180 tablet; Refill: 1  3. Hyperlipidemia associated with type 2 diabetes mellitus (HCC) - rosuvastatin (CRESTOR) 20 MG tablet; Take 1 tablet (20 mg total) by mouth daily.  Dispense: 90 tablet; Refill: 1  4. Pruritus Assc with #5 - cetirizine (ZYRTEC) 10 MG tablet; Take 1 tablet (10 mg total) by mouth daily.  Dispense: 30 tablet; Refill: 11 - triamcinolone cream (KENALOG) 0.1 %; Apply 1 Application topically 2 (two) times daily.  Dispense: 80 g; Refill: 1  5. Other eczema (Primary) - cetirizine (ZYRTEC) 10 MG tablet; Take 1 tablet (10 mg total) by mouth daily.  Dispense: 30 tablet; Refill: 11 - triamcinolone cream (KENALOG) 0.1 %; Apply 1 Application topically 2 (two) times daily.  Dispense: 80 g; Refill: 1  6. Language barrier [facility] "[place]" [phone] interpreters used and additional time performing visit was required.     Return in about 6 months (around 10/10/2023) for PCP for chronic conditions- Dr Alvis Lemmings.  The patient was given clear instructions to go to ER or return to medical center if symptoms don't improve, worsen or new problems develop. The patient verbalized understanding. The patient was told to call to get lab results if they haven't heard anything in the next week.      Georgian Co, PA-C [facility] and [facility] [place], [place] [phone]   04/12/2023, 3:52 PM

## 2023-04-12 NOTE — Patient Instructions (Signed)
Inflamed Skin (Atopic Dermatitis): What to Know Atopic dermatitis is a skin condition that causes dry, itchy, and inflamed skin. It's the most common type of eczema, which is a group of skin conditions that make your skin feel rough and puffy. This condition often gets worse in the winter and better in the summer. Atopic dermatitis usually starts in childhood and can last into adulthood. It's not contagious, so it does not spread from person to person. Your symptoms may get worse when you're having a flare-up. During a flare-up, your symptoms may get worse and bother you. What are the causes? The exact cause of this condition isn't known. Flare-ups can be triggered by: Contact with things you're sensitive or allergic to. Stress. Some foods. Very hot or cold weather. Harsh chemicals and soaps. Dry air. Chlorine. What increases the risk? You're more likely to get this condition if you have a personal or family history of: Eczema. Allergies. Asthma. Hay fever. What are the signs or symptoms?  Dry, scaly skin. Red, brown, purple, or grayish rash. Itchiness. Thick and cracked skin over time. How is this diagnosed? This condition is diagnosed based on: Symptoms. Physical exam. Medical history. How is this treated? There's no cure for this condition, but you can manage your symptoms. Do this by: Controlling your itchiness and scratching with antihistamine medicine or steroid creams. Avoiding allergens or triggers. Managing stress. Trying light therapy, also called phototherapy if other treatments don't work or if it's all over your body. Follow these instructions at home: Skin care  Keep your skin hydrated. To do this: Use unscented lotions that contain petroleum. Avoid lotions with alcohol or water. These can dry out your skin more. Take short baths or showers (less than 5 minutes). Use warm water instead of hot water. Use mild, unscented soaps. Avoid bubble bath. Put lotion on  right after bathing. Do not put anything on your skin without checking with your health care provider. General instructions Take or apply your medicines only as told. Wear clothes made of cotton or cotton blends. Dress lightly to avoid itching that can be caused by heat. When doing laundry, rinse your clothes twice to remove all soap. Use soap that doesn't have dyes and perfumes. Avoid triggers that cause flare-ups. Avoid scratching. It can make the rash and itching worse and can lead to infection. Keep fingernails short to avoid scratching open the skin. Avoid people who have cold sores or fever blisters. These infections can make your condition worse. Keep all follow-up visits to make sure your treatment plan is working. Contact a health care provider if: Your itching affects your sleep. Your rash gets worse or doesn't get better after a week of treatment. You have a fever. You have a rash after being around someone with cold sores or fever blisters. You have warmth or pus in the rash area. You have soft yellow scabs in the rash area. This information is not intended to replace advice given to you by your health care provider. Make sure you discuss any questions you have with your health care provider. Document Revised: 08/09/2022 Document Reviewed: 08/09/2022 Elsevier Patient Education  2024 [employer].

## 2023-04-13 ENCOUNTER — Encounter: Payer: Self-pay | Admitting: Physician Assistant

## 2023-04-13 ENCOUNTER — Other Ambulatory Visit: Payer: Self-pay

## 2023-04-13 LAB — HEMOGLOBIN A1C
Est. average glucose Bld gHb Est-mCnc: 140 mg/dL
Hgb A1c MFr Bld: 6.5 % — ABNORMAL HIGH (ref 4.8–5.6)

## 2023-04-13 MED ORDER — UMECLIDINIUM BROMIDE 62.5 MCG/ACT IN AEPB
1.0000 | INHALATION_SPRAY | Freq: Every day | RESPIRATORY_TRACT | 6 refills | Status: DC
  Filled 2023-04-13: qty 30, 30d supply, fill #0

## 2023-04-13 NOTE — Telephone Encounter (Signed)
Thank you, friend. Rxn was processed through successfully for ~$4.

## 2023-04-13 NOTE — Addendum Note (Signed)
Addended by: Hoy Register on: 04/13/2023 08:45 AM   Modules accepted: Orders

## 2023-04-13 NOTE — Telephone Encounter (Signed)
Yes that is okay to make a substitution.  I have sent in a prescription for Incruse

## 2023-04-14 ENCOUNTER — Telehealth: Payer: Self-pay

## 2023-04-14 NOTE — Telephone Encounter (Signed)
-----   Message from Georgian Co sent at 04/13/2023  8:49 AM EST ----- Please call patient.  A1C has gone up a little and I increased his metformin to twice daily.  Thanks, Georgian Co, PA-C

## 2023-04-14 NOTE — Telephone Encounter (Signed)
Pt was called and vm was left, Information has been sent to nurse pool.    Interpreter id # [id number]

## 2023-05-08 ENCOUNTER — Ambulatory Visit: Payer: Medicare Other | Admitting: Family Medicine

## 2023-05-22 ENCOUNTER — Ambulatory Visit: Payer: Medicare Other | Admitting: Family Medicine

## 2023-08-04 ENCOUNTER — Ambulatory Visit: Payer: Self-pay | Admitting: *Deleted

## 2023-08-04 NOTE — Telephone Encounter (Signed)
 Copied from CRM [id number]. Topic: Clinical - Red Word Triage >> Aug 04, 2023 12:03 PM [place] B wrote: [facility] that prompted transfer to Nurse Triage: pt's daughter in law states pt has allergic reaction and rash on buttocks Reason for Disposition  [1] Looks infected (spreading redness, pus) AND [2] no fever    Rash on buttocks, elbow and arm he has had for a year.  Answer Assessment - Initial Assessment Questions 1. APPEARANCE of RASH: "Describe the rash."      I returned call.   The line disconnected first time around.   I called Bennie Brave daughter in law.   On DPR.    2. LOCATION: "Where is the rash located?"      He has a rash on buttocks, arm and elbow. 3. NUMBER: "How many spots are there?"      It's been there for a year.   He has been in the hospital at [facility] and given medicine.    He had cream for it.   Kenalog  for it.    He needs an appt.    4. SIZE: "How big are the spots?" (Inches, centimeters or compare to size of a coin)      I think he wants to talk with a doctor. 5. ONSET: "When did the rash start?"      A year it's been there.   6. ITCHING: "Does the rash itch?" If Yes, ask: "How bad is the itch?"  (Scale 0-10; or none, mild, moderate, severe)     No itching 7. PAIN: "Does the rash hurt?" If Yes, ask: "How bad is the pain?"  (Scale 0-10; or none, mild, moderate, severe)    - NONE (0): no pain    - MILD (1-3): doesn't interfere with normal activities     - MODERATE (4-7): interferes with normal activities or awakens from sleep     - SEVERE (8-10): excruciating pain, unable to do any normal activities     No 8. OTHER SYMPTOMS: "Do you have any other symptoms?" (e.g., fever)     2023 and 2024 he was in the hospital 9. PREGNANCY: "Is there any chance you are pregnant?" "When was your last menstrual period?"     N/A  Protocols used: Rash or Redness - Localized-A-AH  Chief Complaint: Daughter in law, On DPR, Bennie Brave was called back because line disconnected.   She was interpreting for pt.   He was there with her.   Darivs has a rash on his buttocks, elbow and arm that has been there for a year.  He is requesting an appt.    Symptoms: Pt wanting to come in and talk with doctor and also been seen for rash.   No details here given. Frequency: Rash present for a year. Pertinent Negatives: Patient denies cream he had for it as helping.    Disposition: [] ED /[] Urgent Care (no appt availability in office) / [x] Appointment(In office/virtual)/ []  [facility]/ [] Home Care/ [] Refused Recommended Disposition /[] [facility]/ []  Follow-up with PCP Additional Notes: He has an appt with Dr. Newlin for 08/10/2023.   He was wanting to come in sooner.   Got him scheduled for 09/27/2023 at 9:10 with Dr. Newlin.   I cancelled the appt on 08/10/2023.    This is for his 6 month check up and for rash.

## 2023-08-04 NOTE — Telephone Encounter (Signed)
 Noted.

## 2023-08-09 ENCOUNTER — Encounter: Payer: Self-pay | Admitting: Family Medicine

## 2023-08-09 LAB — HM DIABETES EYE EXAM

## 2023-08-10 ENCOUNTER — Other Ambulatory Visit: Payer: Self-pay

## 2023-08-12 ENCOUNTER — Ambulatory Visit (HOSPITAL_COMMUNITY): Admission: EM | Admit: 2023-08-12 | Discharge: 2023-08-12 | Disposition: A | Discharge status: Home / Self Care

## 2023-08-12 ENCOUNTER — Encounter (HOSPITAL_COMMUNITY): Payer: Self-pay

## 2023-08-12 DIAGNOSIS — M65339 Trigger finger, unspecified middle finger: Secondary | ICD-10-CM | POA: Diagnosis present

## 2023-08-12 DIAGNOSIS — B370 Candidal stomatitis: Secondary | ICD-10-CM

## 2023-08-12 LAB — POCT RAPID STREP A (OFFICE): Rapid Strep A Screen: NEGATIVE

## 2023-08-12 MED ORDER — LIDOCAINE VISCOUS HCL 2 % MT SOLN: 15.0000 mL | Freq: Four times a day (QID) | OROMUCOSAL | 1 refills | Status: AC | PRN

## 2023-08-12 MED ORDER — LIDOCAINE VISCOUS HCL 2 % MT SOLN
15.0000 mL | Freq: Once | OROMUCOSAL | Status: AC
  Administered 2023-08-12: 15 mL via OROMUCOSAL

## 2023-08-12 MED ORDER — CLOTRIMAZOLE 10 MG MT TROC
10.0000 mg | Freq: Every day | OROMUCOSAL | 0 refills | Status: AC
Start: 2023-08-12 — End: ?

## 2023-08-12 MED ORDER — LIDOCAINE VISCOUS HCL 2 % MT SOLN
OROMUCOSAL | Status: AC
  Filled 2023-08-12: qty 15

## 2023-08-12 NOTE — ED Triage Notes (Signed)
 Patient presenting with mouth ulcers and tongue pain onset 3 days ago. Having pain with eating.  States this has not happened before. Denies injuries or any new foods/products. Burns when coughing and spitting out phlegm.  Prescriptions or OTC medications tried: No

## 2023-08-12 NOTE — ED Provider Notes (Signed)
 [facility]  Note:  This document was prepared using Dragon voice recognition software and may include unintentional dictation errors.  MRN: [id number] DOB: 1951-01-09  Subjective:   David Barber is a 73 y.o. male presenting for oral pain to the tongue, roof of the mouth and throat x 2 to 3 days.  Patient reports that it burns in his throat when he swallows.  Denies any known trauma or injury.  Patient has not taken any over-the-counter medication or used any oral rinses to relieve pain.  Patient denies any known exposure to strep.  Patient also reports bilateral hand pain and bilateral middle fingers and thumbs.  Patient reports that sometimes when he is trying to grasp an object his fingers will lock in a flexed position and he is unable to extend them except for with manual retraction.  Patient has never had any injury or trauma to the hands and has not sought treatment or management prior to today.  No current facility-administered medications for this encounter.  Current Outpatient Medications:    albuterol  (VENTOLIN  HFA) 108 (90 Base) MCG/ACT inhaler, INHALE 2 PUFFS INTO THE LUNGS EVERY 6 (SIX) HOURS AS NEEDED FOR WHEEZING OR SHORTNESS OF BREATH., Disp: 36 g, Rfl: 6   cetirizine  (ZYRTEC ) 10 MG tablet, Take 1 tablet (10 mg total) by mouth daily., Disp: 30 tablet, Rfl: 11   clotrimazole  (MYCELEX ) 10 MG troche, Take 1 tablet (10 mg total) by mouth 5 (five) times daily., Disp: 70 Troche, Rfl: 0   hydrOXYzine  (ATARAX ) 25 MG tablet, Take 1 tablet (25 mg total) by mouth every 8 (eight) hours as needed for itching., Disp: 30 tablet, Rfl: 1   lidocaine (XYLOCAINE) 2 % solution, Use as directed 15 mLs in the mouth or throat every 6 (six) hours as needed for mouth pain., Disp: 100 mL, Rfl: 1   metFORMIN  (GLUCOPHAGE -XR) 500 MG 24 hr tablet, Take 1 tablet (500 mg total) by mouth 2 (two) times daily with a meal., Disp: 180 tablet, Rfl: 1   polyethylene glycol (MIRALAX ) 17 g packet,  Take 17 g by mouth daily as needed for moderate constipation or mild constipation., Disp: 30 each, Rfl: 0   rosuvastatin  (CRESTOR ) 20 MG tablet, Take 1 tablet (20 mg total) by mouth daily., Disp: 90 tablet, Rfl: 1   senna-docusate (SENOKOT-S) 8.6-50 MG tablet, Take 1 tablet by mouth at bedtime., Disp: 90 tablet, Rfl: 1   terbinafine  (LAMISIL ) 250 MG tablet, Take 1 tablet (250 mg total) by mouth daily., Disp: 14 tablet, Rfl: 0   triamcinolone  cream (KENALOG ) 0.1 %, Apply 1 Application topically 2 (two) times daily., Disp: 80 g, Rfl: 1   umeclidinium bromide  (INCRUSE ELLIPTA ) 62.5 MCG/ACT AEPB, Inhale 1 puff into the lungs daily., Disp: 30 each, Rfl: 6   glucose blood (ACCU-CHEK GUIDE) test strip, Check blood sugar once a day., Disp: 100 each, Rfl: 2   meloxicam  (MOBIC ) 7.5 MG tablet, Take 1 tablet (7.5 mg total) by mouth daily., Disp: 30 tablet, Rfl: 1   Misc. Devices MISC, Discontinue oxygen , Disp: 1 each, Rfl: 0   No Known Allergies  Past Medical History:  Diagnosis Date   Allergy    Anxiety    Asthma    Bronchitis    Cataract    COPD (chronic obstructive pulmonary disease) (HCC)    Diabetes mellitus without complication (HCC)    GERD (gastroesophageal reflux disease)    Hyperlipidemia    Hypertension      Past Surgical History:  Procedure Laterality Date   COLONOSCOPY     CYST REMOVAL NECK  03/22/2011   benign   POLYPECTOMY     UPPER GASTROINTESTINAL ENDOSCOPY      Family History  Problem Relation Age of Onset   Colon cancer Neg Hx    Colon polyps Neg Hx    Esophageal cancer Neg Hx    Rectal cancer Neg Hx    Stomach cancer Neg Hx     Social History   Tobacco Use   Smoking status: Former    Current packs/day: 0.00    Types: Cigarettes    Quit date: 10/19/2013    Years since quitting: 9.8   Smokeless tobacco: Current    Types: Chew   Tobacco comments:    chewing tobacco  Vaping Use   Vaping status: Never Used  Substance Use Topics   Alcohol  use: Not Currently    Drug use: No    ROS Refer to HPI for ROS details.  Objective:   Vitals: BP 114/71 (BP Location: Right Arm)   Pulse 73   Temp 98.2 F (36.8 C) (Oral)   Resp 16   SpO2 92%   Physical Exam Vitals and nursing note reviewed.  Constitutional:      General: He is not in acute distress.    Appearance: Normal appearance. He is well-developed. He is not ill-appearing or toxic-appearing.  HENT:     Head: Normocephalic.     Nose: Nose normal.     Mouth/Throat:     Mouth: Mucous membranes are moist.     Dentition: Abnormal dentition. Dental caries present. No dental tenderness, gingival swelling, dental abscesses or gum lesions.     Tongue: Lesions (Mild discoloration noted on exam, possibly consistent with oral thrush) present.     Palate: Lesions present.     Pharynx: No pharyngeal swelling, oropharyngeal exudate or posterior oropharyngeal erythema.  Cardiovascular:     Rate and Rhythm: Normal rate.  Pulmonary:     Effort: Pulmonary effort is normal. No respiratory distress.  Skin:    General: Skin is warm and dry.  Neurological:     General: No focal deficit present.     Mental Status: He is alert and oriented to person, place, and time.  Psychiatric:        Mood and Affect: Mood normal.        Behavior: Behavior normal.     Procedures  Results for orders placed or performed during the hospital encounter of 08/12/23 (from the past 24 hours)  POC rapid strep A     Status: None   Collection Time: 08/12/23 11:06 AM  Result Value Ref Range   Rapid Strep A Screen Negative Negative    No results found.   Assessment and Plan :     Discharge Instructions      1. Trigger middle finger, unspecified laterality (Primary) - AMB referral to sports medicine for follow-up evaluation and treatment of chronic trigger finger to bilateral hands.  2. Oral thrush - POC rapid strep A performed in UC is negative for strep pharyngitis -Throat culture collected and sent to lab for  further testing results should be available in 2 to 3 days if there is any abnormalities you will be contacted appropriate guidance provided - lidocaine (XYLOCAINE) 2 % viscous mouth solution 15 mL given in UC for acute mouth pain possibly secondary to oral thrush. - lidocaine (XYLOCAINE) 2 % solution; Use as directed 15 mLs in the mouth or throat  every 6 (six) hours as needed for mouth pain.  Dispense: 100 mL; Refill: 1 - clotrimazole  (MYCELEX ) 10 MG troche; Take 1 tablet (10 mg total) by mouth 5 (five) times daily.  Dispense: 70 Troche; Refill: 0 - If symptoms persist follow-up with dentist for further evaluation and treatment of oral pain. -Continue to monitor symptoms for any change in severity if there is any escalation of current symptoms or development of new symptoms follow-up in ER for further evaluation and management.    Rilie Glanz B Arantza Darrington   Starlene Consuegra, [place], [place] 08/12/23 1130

## 2023-08-12 NOTE — Discharge Instructions (Addendum)
 1. Trigger middle finger, unspecified laterality (Primary) - AMB referral to sports medicine for follow-up evaluation and treatment of chronic trigger finger to bilateral hands.  2. Oral thrush - POC rapid strep A performed in UC is negative for strep pharyngitis -Throat culture collected and sent to lab for further testing results should be available in 2 to 3 days if there is any abnormalities you will be contacted appropriate guidance provided - lidocaine (XYLOCAINE) 2 % viscous mouth solution 15 mL given in UC for acute mouth pain possibly secondary to oral thrush. - lidocaine (XYLOCAINE) 2 % solution; Use as directed 15 mLs in the mouth or throat every 6 (six) hours as needed for mouth pain.  Dispense: 100 mL; Refill: 1 - clotrimazole  (MYCELEX ) 10 MG troche; Take 1 tablet (10 mg total) by mouth 5 (five) times daily.  Dispense: 70 Troche; Refill: 0 - If symptoms persist follow-up with dentist for further evaluation and treatment of oral pain. -Continue to monitor symptoms for any change in severity if there is any escalation of current symptoms or development of new symptoms follow-up in ER for further evaluation and management.

## 2023-08-15 LAB — CULTURE, GROUP A STREP (THRC)

## 2023-08-16 ENCOUNTER — Ambulatory Visit (HOSPITAL_COMMUNITY): Payer: Self-pay

## 2023-09-27 ENCOUNTER — Other Ambulatory Visit: Payer: Self-pay

## 2023-09-27 ENCOUNTER — Encounter: Payer: Self-pay | Admitting: Family Medicine

## 2023-09-27 ENCOUNTER — Ambulatory Visit: Attending: Family Medicine | Admitting: Family Medicine

## 2023-09-27 VITALS — BP 123/79 | HR 87 | Wt 165.6 lb

## 2023-09-27 DIAGNOSIS — F17220 Nicotine dependence, chewing tobacco, uncomplicated: Secondary | ICD-10-CM | POA: Insufficient documentation

## 2023-09-27 DIAGNOSIS — R21 Rash and other nonspecific skin eruption: Secondary | ICD-10-CM | POA: Diagnosis present

## 2023-09-27 DIAGNOSIS — L299 Pruritus, unspecified: Secondary | ICD-10-CM | POA: Diagnosis not present

## 2023-09-27 DIAGNOSIS — E785 Hyperlipidemia, unspecified: Secondary | ICD-10-CM | POA: Diagnosis not present

## 2023-09-27 DIAGNOSIS — Z7182 Exercise counseling: Secondary | ICD-10-CM | POA: Diagnosis not present

## 2023-09-27 DIAGNOSIS — E1169 Type 2 diabetes mellitus with other specified complication: Secondary | ICD-10-CM | POA: Diagnosis not present

## 2023-09-27 DIAGNOSIS — H04203 Unspecified epiphora, bilateral lacrimal glands: Secondary | ICD-10-CM

## 2023-09-27 DIAGNOSIS — J449 Chronic obstructive pulmonary disease, unspecified: Secondary | ICD-10-CM

## 2023-09-27 DIAGNOSIS — Z79899 Other long term (current) drug therapy: Secondary | ICD-10-CM | POA: Diagnosis not present

## 2023-09-27 DIAGNOSIS — H04212 Epiphora due to excess lacrimation, left lacrimal gland: Secondary | ICD-10-CM | POA: Diagnosis not present

## 2023-09-27 DIAGNOSIS — E119 Type 2 diabetes mellitus without complications: Secondary | ICD-10-CM | POA: Diagnosis not present

## 2023-09-27 DIAGNOSIS — Z7984 Long term (current) use of oral hypoglycemic drugs: Secondary | ICD-10-CM | POA: Diagnosis not present

## 2023-09-27 DIAGNOSIS — J4489 Other specified chronic obstructive pulmonary disease: Secondary | ICD-10-CM | POA: Diagnosis not present

## 2023-09-27 LAB — GLUCOSE, POCT (MANUAL RESULT ENTRY): POC Glucose: 160 mg/dL — AB (ref 70–99)

## 2023-09-27 LAB — POCT GLYCOSYLATED HEMOGLOBIN (HGB A1C): HbA1c, POC (controlled diabetic range): 6.5 % (ref 0.0–7.0)

## 2023-09-27 MED ORDER — ROSUVASTATIN CALCIUM 20 MG PO TABS
20.0000 mg | ORAL_TABLET | Freq: Every day | ORAL | 1 refills | Status: DC
Start: 2023-09-27 — End: 2023-10-03
  Filled 2023-09-27: qty 90, 90d supply, fill #0

## 2023-09-27 MED ORDER — UMECLIDINIUM BROMIDE 62.5 MCG/ACT IN AEPB
1.0000 | INHALATION_SPRAY | Freq: Every day | RESPIRATORY_TRACT | 6 refills | Status: DC
  Filled 2023-09-27: qty 30, 30d supply, fill #0
  Filled 2023-12-02 – 2023-12-04 (×2): qty 30, 30d supply, fill #1

## 2023-09-27 MED ORDER — METFORMIN HCL ER 500 MG PO TB24
500.0000 mg | ORAL_TABLET | Freq: Two times a day (BID) | ORAL | 1 refills | Status: DC
  Filled 2023-09-27: qty 180, 90d supply, fill #0
  Filled 2024-02-28: qty 180, 90d supply, fill #1

## 2023-09-27 MED ORDER — ALBUTEROL SULFATE HFA 108 (90 BASE) MCG/ACT IN AERS
2.0000 | INHALATION_SPRAY | Freq: Four times a day (QID) | RESPIRATORY_TRACT | 6 refills | Status: AC | PRN
  Filled 2023-09-27: qty 36, 50d supply, fill #0
  Filled 2024-02-28: qty 20.1, 84d supply, fill #1

## 2023-09-27 NOTE — Progress Notes (Signed)
 Subjective:  Patient ID: David Barber, male    DOB: 10-31-1950  Age: 73 y.o. MRN: [id number]  CC: Medical Management of Chronic Issues (Rash on arms ) and Eye Problem     Discussed the use of AI scribe software for clinical note transcription with the patient, who gave verbal consent to proceed.  History of Present Illness David Barber is a 73 year old male with a history of COPD, type 2 diabetes mellitus (A1c 6.2), hyperlipidemia COPD,  here for chronic disease management.tobacco use (chews tobacco), who presents for a follow-up visit.  He has had a rash for one year, initially severe on his legs and upper body, now improving with residual itchiness on his arm.  States this is no longer a problem for him.  He had been referred to [facility] health dermatology and a note in his chart indicates the dermatology practice was unsuccessful in contacting him to schedule an appointment.  He experiences constant tearing in his left eye and wishes to see an eye doctor. There are no vision problems or itching in his eyes.  His diabetes is well controlled with a recent A1c of 6.5%, and he continues to take metformin .  His blood pressure is well controlled at 120 mmHg systolic, and he maintains his current medication regimen.  His breathing is stable, and he continues to use Incruse and albuterol  for COPD management.  His cholesterol was last checked in 2023 and was normal. He continues to take Crestor  for cholesterol management.    Past Medical History:  Diagnosis Date   Allergy    Anxiety    Asthma    Bronchitis    Cataract    COPD (chronic obstructive pulmonary disease) (HCC)    Diabetes mellitus without complication (HCC)    GERD (gastroesophageal reflux disease)    Hyperlipidemia    Hypertension     Past Surgical History:  Procedure Laterality Date   COLONOSCOPY     CYST REMOVAL NECK  03/22/2011   benign   POLYPECTOMY     UPPER GASTROINTESTINAL ENDOSCOPY       Family History  Problem Relation Age of Onset   Colon cancer Neg Hx    Colon polyps Neg Hx    Esophageal cancer Neg Hx    Rectal cancer Neg Hx    Stomach cancer Neg Hx     Social History   Socioeconomic History   Marital status: Married    Spouse name: Not on file   Number of children: Not on file   Years of education: Not on file   Highest education level: Not on file  Occupational History   Not on file  Tobacco Use   Smoking status: Former    Current packs/day: 0.00    Types: Cigarettes    Quit date: 10/19/2013    Years since quitting: 9.9   Smokeless tobacco: Current    Types: Chew   Tobacco comments:    chewing tobacco  Vaping Use   Vaping status: Never Used  Substance and Sexual Activity   Alcohol  use: Not Currently   Drug use: No   Sexual activity: Not Currently  Other Topics Concern   Not on file  Social History Narrative   Not on file   Social Drivers of Health   Financial Resource Strain: Low Risk  (04/28/2022)   Overall Financial Resource Strain (CARDIA)    Difficulty of Paying Living Expenses: Not hard at all  Food Insecurity: No Food Insecurity (04/28/2022)  Hunger Vital Sign    Worried About Running Out of Food in the Last Year: Never true    Ran Out of Food in the Last Year: Never true  Transportation Needs: No Transportation Needs (04/28/2022)   PRAPARE - Administrator, Civil Service (Medical): No    Lack of Transportation (Non-Medical): No  Physical Activity: Insufficiently Active (04/28/2022)   Exercise Vital Sign    Days of Exercise per Week: 7 days    Minutes of Exercise per Session: 20 min  Stress: No Stress Concern Present (04/28/2022)   [employer] of Occupational Health - Occupational Stress Questionnaire    Feeling of Stress : Not at all  Social Connections: Not on file    No Known Allergies  Outpatient Medications Prior to Visit  Medication Sig Dispense Refill   cetirizine  (ZYRTEC ) 10 MG tablet Take 1 tablet (10  mg total) by mouth daily. 30 tablet 11   clotrimazole  (MYCELEX ) 10 MG troche Take 1 tablet (10 mg total) by mouth 5 (five) times daily. 70 Troche 0   glucose blood (ACCU-CHEK GUIDE) test strip Check blood sugar once a day. 100 each 2   hydrOXYzine  (ATARAX ) 25 MG tablet Take 1 tablet (25 mg total) by mouth every 8 (eight) hours as needed for itching. 30 tablet 1   lidocaine  (XYLOCAINE ) 2 % solution Use as directed 15 mLs in the mouth or throat every 6 (six) hours as needed for mouth pain. 100 mL 1   meloxicam  (MOBIC ) 7.5 MG tablet Take 1 tablet (7.5 mg total) by mouth daily. 30 tablet 1   Misc. Devices MISC Discontinue oxygen  1 each 0   polyethylene glycol (MIRALAX ) 17 g packet Take 17 g by mouth daily as needed for moderate constipation or mild constipation. 30 each 0   senna-docusate (SENOKOT-S) 8.6-50 MG tablet Take 1 tablet by mouth at bedtime. 90 tablet 1   terbinafine  (LAMISIL ) 250 MG tablet Take 1 tablet (250 mg total) by mouth daily. 14 tablet 0   triamcinolone  cream (KENALOG ) 0.1 % Apply 1 Application topically 2 (two) times daily. 80 g 1   albuterol  (VENTOLIN  HFA) 108 (90 Base) MCG/ACT inhaler INHALE 2 PUFFS INTO THE LUNGS EVERY 6 (SIX) HOURS AS NEEDED FOR WHEEZING OR SHORTNESS OF BREATH. 36 g 6   metFORMIN  (GLUCOPHAGE -XR) 500 MG 24 hr tablet Take 1 tablet (500 mg total) by mouth 2 (two) times daily with a meal. 180 tablet 1   rosuvastatin  (CRESTOR ) 20 MG tablet Take 1 tablet (20 mg total) by mouth daily. 90 tablet 1   umeclidinium bromide  (INCRUSE ELLIPTA ) 62.5 MCG/ACT AEPB Inhale 1 puff into the lungs daily. 30 each 6   No facility-administered medications prior to visit.     ROS Review of Systems  Constitutional:  Negative for activity change and appetite change.  HENT:  Negative for sinus pressure and sore throat.   Eyes:  Positive for discharge. Negative for photophobia and visual disturbance.  Respiratory:  Negative for chest tightness, shortness of breath and wheezing.    Cardiovascular:  Negative for chest pain and palpitations.  Gastrointestinal:  Negative for abdominal distention, abdominal pain and constipation.  Genitourinary: Negative.   Musculoskeletal: Negative.   Skin:        See HPI  Psychiatric/Behavioral:  Negative for behavioral problems and dysphoric mood.     Objective:  BP 123/79   Pulse 87   Wt 165 lb 9.6 oz (75.1 kg)   SpO2 95%   BMI 26.73  kg/m      09/27/2023    8:53 AM 08/12/2023   10:36 AM 04/12/2023    3:38 PM  BP/Weight  Systolic BP 123 114 137  Diastolic BP 79 71 85  Wt. (Lbs) 165.6  170.6  BMI 26.73 kg/m2  27.54 kg/m2      Physical Exam Constitutional:      Appearance: He is well-developed.  Cardiovascular:     Rate and Rhythm: Normal rate.     Heart sounds: Normal heart sounds. No murmur heard. Pulmonary:     Effort: Pulmonary effort is normal.     Breath sounds: Normal breath sounds. No wheezing or rales.  Chest:     Chest wall: No tenderness.  Abdominal:     General: Bowel sounds are normal. There is no distension.     Palpations: Abdomen is soft. There is no mass.     Tenderness: There is no abdominal tenderness.  Musculoskeletal:        General: Normal range of motion.     Right lower leg: No edema.     Left lower leg: No edema.  Neurological:     Mental Status: He is alert and oriented to person, place, and time.  Psychiatric:        Mood and Affect: Mood normal.        Latest Ref Rng & Units 11/02/2022    2:41 PM 11/24/2021    9:13 AM 06/16/2021    4:49 PM  CMP  Glucose 70 - 99 mg/dL 888  895  94   BUN 8 - 27 mg/dL 5  8  8    Creatinine 0.76 - 1.27 mg/dL 9.15  9.16  9.18   Sodium 134 - 144 mmol/L 141  143  143   Potassium 3.5 - 5.2 mmol/L 4.0  4.2  4.4   Chloride 96 - 106 mmol/L 103  106  105   CO2 20 - 29 mmol/L 24  23  25    Calcium  8.6 - 10.2 mg/dL 9.5  9.8  9.7   Total Protein 6.0 - 8.5 g/dL 7.5   7.1   Total Bilirubin 0.0 - 1.2 mg/dL 0.4   0.4   Alkaline Phos 44 - 121 IU/L 122   117    AST 0 - 40 IU/L 28   24   ALT 0 - 44 IU/L 25   25     Lipid Panel     Component Value Date/Time   CHOL 141 11/24/2021 0913   TRIG 183 (H) 11/24/2021 0913   HDL 36 (L) 11/24/2021 0913   CHOLHDL 3.9 11/24/2021 0913   CHOLHDL 6.5 (H) 07/21/2015 1625   VLDL 61 (H) 07/21/2015 1625   LDLCALC 74 11/24/2021 0913    CBC    Component Value Date/Time   WBC 8.0 01/23/2019 0140   RBC 5.28 01/23/2019 0140   HGB 14.3 01/23/2019 0140   HCT 44.6 01/23/2019 0140   PLT 149 (L) 01/23/2019 0140   MCV 84.5 01/23/2019 0140   MCH 27.1 01/23/2019 0140   MCHC 32.1 01/23/2019 0140   RDW 15.4 01/23/2019 0140   LYMPHSABS 0.9 01/23/2019 0140   MONOABS 0.2 01/23/2019 0140   EOSABS 0.0 01/23/2019 0140   BASOSABS 0.0 01/23/2019 0140    Lab Results  Component Value Date   HGBA1C 6.5 09/27/2023       Assessment & Plan Left Eye Tearing Referral to ophthalmology   Pruritus with Rash Residual pruritus on arm. Previous dermatology referral not  followed up. - Provide dermatologist contact information for follow-up.  Type 2 Diabetes Mellitus Diabetes well controlled with A1c of 6.5%. - Continue metformin  500 mg oral daily with breakfast. -Counseled on Diabetic diet, the healthy plate, 849 minutes of moderate intensity exercise/week Blood sugar logs with fasting goals of 80-120 mg/dl, random of less than 819 and in the event of sugars less than 60 mg/dl or greater than 599 mg/dl encouraged to notify the clinic. Advised on the need for annual eye exams, annual foot exams, Pneumonia vaccine.    Chronic Obstructive Pulmonary Disease (COPD) - Continue Incruse inhalation daily. - Continue albuterol  (Ventolin  HFA) 108 mcg inhalation every 6 hours as needed for wheezing or dyspnea.  Hyperlipidemia Cholesterol levels and LDL normal in 2023. Repeat testing planned. - Order blood test for cholesterol. - Continue rosuvastatin  (Crestor ) 20 mg oral daily.    Meds ordered this encounter  Medications    albuterol  (VENTOLIN  HFA) 108 (90 Base) MCG/ACT inhaler    Sig: INHALE 2 PUFFS INTO THE LUNGS EVERY 6 (SIX) HOURS AS NEEDED FOR WHEEZING OR SHORTNESS OF BREATH.    Dispense:  36 g    Refill:  6   metFORMIN  (GLUCOPHAGE -XR) 500 MG 24 hr tablet    Sig: Take 1 tablet (500 mg total) by mouth 2 (two) times daily with a meal.    Dispense:  180 tablet    Refill:  1   rosuvastatin  (CRESTOR ) 20 MG tablet    Sig: Take 1 tablet (20 mg total) by mouth daily.    Dispense:  90 tablet    Refill:  1   umeclidinium bromide  (INCRUSE ELLIPTA ) 62.5 MCG/ACT AEPB    Sig: Inhale 1 puff into the lungs daily.    Dispense:  30 each    Refill:  6    Follow-up: Return in about 6 months (around 03/29/2024) for Chronic medical conditions.       Corrina Sabin, MD, FAAFP. [facility] [place], [place] [phone]   09/27/2023, 9:19 AM

## 2023-09-27 NOTE — Patient Instructions (Signed)
 Please call for your dermatology appointment: [facility] Phone  [phone] Fax  [phone]

## 2023-09-28 LAB — CMP14+EGFR
ALT: 19 IU/L (ref 0–44)
AST: 23 IU/L (ref 0–40)
Albumin: 4.4 g/dL (ref 3.8–4.8)
Alkaline Phosphatase: 123 IU/L — ABNORMAL HIGH (ref 44–121)
BUN/Creatinine Ratio: 11 (ref 10–24)
BUN: 9 mg/dL (ref 8–27)
Bilirubin Total: 0.5 mg/dL (ref 0.0–1.2)
CO2: 20 mmol/L (ref 20–29)
Calcium: 9.8 mg/dL (ref 8.6–10.2)
Chloride: 103 mmol/L (ref 96–106)
Creatinine, Ser: 0.81 mg/dL (ref 0.76–1.27)
Globulin, Total: 2.8 g/dL (ref 1.5–4.5)
Glucose: 114 mg/dL — ABNORMAL HIGH (ref 70–99)
Potassium: 4.3 mmol/L (ref 3.5–5.2)
Sodium: 142 mmol/L (ref 134–144)
Total Protein: 7.2 g/dL (ref 6.0–8.5)
eGFR: 93 mL/min/1.73 (ref >59)

## 2023-09-28 LAB — LP+NON-HDL CHOLESTEROL
Cholesterol, Total: 234 mg/dL — ABNORMAL HIGH (ref 100–199)
HDL: 42 mg/dL (ref >39)
LDL Chol Calc (NIH): 155 mg/dL — ABNORMAL HIGH (ref 0–99)
Total Non-HDL-Chol (LDL+VLDL): 192 mg/dL — ABNORMAL HIGH (ref 0–129)
Triglycerides: 200 mg/dL — ABNORMAL HIGH (ref 0–149)
VLDL Cholesterol Cal: 37 mg/dL (ref 5–40)

## 2023-09-29 ENCOUNTER — Ambulatory Visit: Payer: Self-pay | Admitting: Family Medicine

## 2023-09-29 LAB — MICROALBUMIN / CREATININE URINE RATIO
Creatinine, Urine: 204.8 mg/dL
Microalb/Creat Ratio: 4 mg/g{creat} (ref 0–29)
Microalbumin, Urine: 8.7 ug/mL

## 2023-10-03 ENCOUNTER — Other Ambulatory Visit: Payer: Self-pay

## 2023-10-03 ENCOUNTER — Telehealth: Payer: Self-pay

## 2023-10-03 DIAGNOSIS — E1169 Type 2 diabetes mellitus with other specified complication: Secondary | ICD-10-CM

## 2023-10-03 MED ORDER — ROSUVASTATIN CALCIUM 40 MG PO TABS
40.0000 mg | ORAL_TABLET | Freq: Every day | ORAL | 1 refills | Status: AC
  Filled 2023-10-03 – 2024-01-09 (×2): qty 90, 90d supply, fill #0
  Filled 2024-02-28 – 2024-04-19 (×2): qty 90, 90d supply, fill #1

## 2023-10-03 NOTE — Telephone Encounter (Signed)
 Called patient and spoke with son(who is on dpr)  he statetd that his father is taking his cholesterol medication

## 2023-10-03 NOTE — Telephone Encounter (Signed)
 I have sent a prescription for an increased dose of Crestor  40 mg to his pharmacy due to elevated cholesterol.  Thanks.

## 2023-10-03 NOTE — Addendum Note (Signed)
 Addended by: Anabella Capshaw on: 10/03/2023 05:57 PM   Modules accepted: Orders

## 2023-10-04 ENCOUNTER — Other Ambulatory Visit: Payer: Self-pay

## 2023-10-05 NOTE — Telephone Encounter (Signed)
 Called patient son (who is on [place]) and made him aware of medication

## 2023-10-10 ENCOUNTER — Ambulatory Visit: Payer: Medicare Other | Admitting: Family Medicine

## 2023-10-12 ENCOUNTER — Other Ambulatory Visit: Payer: Self-pay

## 2023-10-31 ENCOUNTER — Ambulatory Visit (INDEPENDENT_AMBULATORY_CARE_PROVIDER_SITE_OTHER)

## 2023-10-31 ENCOUNTER — Ambulatory Visit (HOSPITAL_COMMUNITY)
Admission: EM | Admit: 2023-10-31 | Discharge: 2023-10-31 | Disposition: A | Discharge status: Home / Self Care | Attending: Nurse Practitioner | Admitting: Nurse Practitioner

## 2023-10-31 ENCOUNTER — Encounter (HOSPITAL_COMMUNITY): Payer: Self-pay | Admitting: Emergency Medicine

## 2023-10-31 DIAGNOSIS — R0602 Shortness of breath: Secondary | ICD-10-CM | POA: Diagnosis not present

## 2023-10-31 DIAGNOSIS — R051 Acute cough: Secondary | ICD-10-CM | POA: Diagnosis not present

## 2023-10-31 MED ORDER — PREDNISONE 20 MG PO TABS
20.0000 mg | ORAL_TABLET | Freq: Two times a day (BID) | ORAL | 0 refills | Status: AC
Start: 2023-10-31 — End: 2023-11-05

## 2023-10-31 MED ORDER — BENZONATATE 200 MG PO CAPS
200.0000 mg | ORAL_CAPSULE | Freq: Three times a day (TID) | ORAL | 0 refills | Status: AC | PRN
Start: 2023-10-31 — End: ?

## 2023-10-31 NOTE — Discharge Instructions (Signed)
 Low dose prednisone  twice daily for five days. Tessalon  Perles sent in for the cough. Seek re-evaluation for onset of chest pain, worsening shortness of breath, or fever.

## 2023-10-31 NOTE — ED Provider Notes (Signed)
 [facility]    CSN: [id number] Arrival date & time: 10/31/23  1535      History   Chief Complaint Chief Complaint  Patient presents with   Shortness of Breath   Pruritis    HPI David Barber is a 73 y.o. male.   Patient presents requesting evaluation for 4-day history of a productive cough and associated shortness of breath.  He denies any fever, body aches, chills, or other URI symptoms.  Denies any lower extremity swelling, chest pain, or palpitations.  He has had episodic diarrhea over the past 2 days.  Patient has been using his albuterol  inhaler approximately 4-5 times a day for the symptoms.  No other medications taken.  He does report a history of asthma in the past.  The history is provided by the patient.  Shortness of Breath Associated symptoms: cough   Associated symptoms: no abdominal pain, no chest pain, no fever, no headaches, no rash, no sore throat and no vomiting     Past Medical History:  Diagnosis Date   Allergy    Anxiety    Asthma    Bronchitis    Cataract    COPD (chronic obstructive pulmonary disease) (HCC)    Diabetes mellitus without complication (HCC)    GERD (gastroesophageal reflux disease)    Hyperlipidemia    Hypertension     Patient Active Problem List   Diagnosis Date Noted   Constipation 11/24/2021   Diabetic neuropathy (HCC) 01/22/2018   Hyperlipidemia associated with type 2 diabetes mellitus (HCC) 07/29/2015   Hearing loss 07/29/2015   Vitamin D  deficiency 05/15/2015   Chronic low back pain 02/11/2015   COPD, mild (HCC) 11/17/2014   Chewing tobacco use 11/17/2014   GERD (gastroesophageal reflux disease) 06/18/2014   Nonspecific abnormal electrocardiogram (ECG) (EKG) 06/16/2014   Diabetes type 2, controlled (HCC) 12/21/2012    Past Surgical History:  Procedure Laterality Date   COLONOSCOPY     CYST REMOVAL NECK  03/22/2011   benign   POLYPECTOMY     UPPER GASTROINTESTINAL ENDOSCOPY         Home  Medications    Prior to Admission medications   Medication Sig Start Date End Date Taking? Authorizing Provider  benzonatate  (TESSALON ) 200 MG capsule Take 1 capsule (200 mg total) by mouth every 8 (eight) hours as needed for cough. 10/31/23  Yes Janet Therisa PARAS, FNP  predniSONE  (DELTASONE ) 20 MG tablet Take 1 tablet (20 mg total) by mouth 2 (two) times daily for 5 days. 10/31/23 11/05/23 Yes Janet Therisa PARAS, FNP  albuterol  (VENTOLIN  HFA) 108 (90 Base) MCG/ACT inhaler INHALE 2 PUFFS INTO THE LUNGS EVERY 6 (SIX) HOURS AS NEEDED FOR WHEEZING OR SHORTNESS OF BREATH. 09/27/23   Newlin, Enobong, MD  cetirizine  (ZYRTEC ) 10 MG tablet Take 1 tablet (10 mg total) by mouth daily. 04/12/23   Danton Jon HERO, PA-C  clotrimazole  (MYCELEX ) 10 MG troche Take 1 tablet (10 mg total) by mouth 5 (five) times daily. 08/12/23   Reddick, Johnathan B, NP  glucose blood (ACCU-CHEK GUIDE) test strip Check blood sugar once a day. 10/12/20   Newlin, Enobong, MD  hydrOXYzine  (ATARAX ) 25 MG tablet Take 1 tablet (25 mg total) by mouth every 8 (eight) hours as needed for itching. 11/02/22   Newlin, Enobong, MD  lidocaine  (XYLOCAINE ) 2 % solution Use as directed 15 mLs in the mouth or throat every 6 (six) hours as needed for mouth pain. 08/12/23   Reddick, Johnathan B, NP  meloxicam  (MOBIC ) 7.5 MG tablet Take 1 tablet (7.5 mg total) by mouth daily. 11/02/22   Newlin, Enobong, MD  metFORMIN  (GLUCOPHAGE -XR) 500 MG 24 hr tablet Take 1 tablet (500 mg total) by mouth 2 (two) times daily with a meal. 09/27/23   Delbert Clam, MD  Misc. Devices MISC Discontinue oxygen  05/06/22   Newlin, Enobong, MD  polyethylene glycol (MIRALAX ) 17 g packet Take 17 g by mouth daily as needed for moderate constipation or mild constipation. 09/15/22   LampteyAleene KIDD, MD  rosuvastatin  (CRESTOR ) 40 MG tablet Take 1 tablet (40 mg total) by mouth daily. 10/03/23   Newlin, Enobong, MD  senna-docusate (SENOKOT-S) 8.6-50 MG tablet Take 1 tablet by mouth at bedtime. 09/15/22    Blaise Aleene KIDD, MD  terbinafine  (LAMISIL ) 250 MG tablet Take 1 tablet (250 mg total) by mouth daily. 09/15/22   LampteyAleene KIDD, MD  triamcinolone  cream (KENALOG ) 0.1 % Apply 1 Application topically 2 (two) times daily. 04/12/23   McClung, Angela M, PA-C  umeclidinium bromide  (INCRUSE ELLIPTA ) 62.5 MCG/ACT AEPB Inhale 1 puff into the lungs daily. 09/27/23   Newlin, Enobong, MD  atorvastatin  (LIPITOR) 80 MG tablet Take 1 tablet (80 mg total) by mouth daily. 05/27/20 05/29/20  Delbert Clam, MD    Family History Family History  Problem Relation Age of Onset   Colon cancer Neg Hx    Colon polyps Neg Hx    Esophageal cancer Neg Hx    Rectal cancer Neg Hx    Stomach cancer Neg Hx     Social History Social History   Tobacco Use   Smoking status: Former    Current packs/day: 0.00    Types: Cigarettes    Quit date: 10/19/2013    Years since quitting: 10.0   Smokeless tobacco: Current    Types: Chew   Tobacco comments:    chewing tobacco  Vaping Use   Vaping status: Never Used  Substance Use Topics   Alcohol  use: Not Currently   Drug use: No     Allergies   Patient has no known allergies.   Review of Systems Review of Systems  Constitutional:  Negative for chills, fatigue and fever.  HENT:  Negative for congestion, rhinorrhea and sore throat.   Eyes:  Negative for pain and redness.  Respiratory:  Positive for cough and shortness of breath. Negative for chest tightness.   Cardiovascular:  Negative for chest pain and palpitations.  Gastrointestinal:  Negative for abdominal pain, diarrhea and vomiting.  Genitourinary:  Negative for dysuria.  Musculoskeletal:  Negative for arthralgias and myalgias.  Skin:  Negative for rash.  Neurological:  Negative for dizziness and headaches.  Psychiatric/Behavioral:  Negative for behavioral problems.      Physical Exam Triage Vital Signs ED Triage Vitals  Encounter Vitals Group     BP 10/31/23 1625 103/68     Girls Systolic BP  Percentile --      Girls Diastolic BP Percentile --      Boys Systolic BP Percentile --      Boys Diastolic BP Percentile --      Pulse Rate 10/31/23 1625 93     Resp 10/31/23 1625 20     Temp 10/31/23 1625 98 F (36.7 C)     Temp Source 10/31/23 1625 Oral     SpO2 10/31/23 1625 90 %     Weight --      Height --      Head Circumference --  Peak Flow --      Pain Score 10/31/23 1622 0     Pain Loc --      Pain Education --      Exclude from Growth Chart --    No data found.  Updated Vital Signs BP 103/68 (BP Location: Left Arm)   Pulse 93   Temp 98 F (36.7 C) (Oral)   Resp 20   SpO2 90%   Visual Acuity Right Eye Distance:   Left Eye Distance:   Bilateral Distance:    Right Eye Near:   Left Eye Near:    Bilateral Near:     Physical Exam Vitals and nursing note reviewed.  Constitutional:      Appearance: He is well-developed.  Cardiovascular:     Rate and Rhythm: Normal rate and regular rhythm.     Heart sounds: Normal heart sounds.  Pulmonary:     Effort: Pulmonary effort is normal.     Breath sounds: Wheezing present.     Comments: Bilateral lower lobes.  Repeat pulse oximetry 95%. Abdominal:     General: Bowel sounds are normal.  Skin:    General: Skin is warm and dry.  Neurological:     General: No focal deficit present.     Mental Status: He is alert and oriented to person, place, and time.  Psychiatric:        Mood and Affect: Mood normal.        Behavior: Behavior normal.        Thought Content: Thought content normal.    UC Treatments / Results  Labs (all labs ordered are listed, but only abnormal results are displayed) Labs Reviewed - No data to display  EKG   Radiology DG Chest 2 View Result Date: 10/31/2023 CLINICAL DATA:  Shortness of breath. EXAM: CHEST - 2 VIEW COMPARISON:  09/15/2022 FINDINGS: The heart size and mediastinal contours are within normal limits. No focal consolidation, pleural effusion, or pneumothorax. No acute  osseous abnormality. IMPRESSION: No acute cardiopulmonary findings. Electronically Signed   By: Harrietta Sherry M.D.   On: 10/31/2023 18:04    Procedures Procedures (including critical care time)  Medications Ordered in UC Medications - No data to display  Initial Impression / Assessment and Plan / UC Course  I have reviewed the triage vital signs and the nursing notes.  Pertinent labs & imaging results that were available during my care of the patient were reviewed by me and considered in my medical decision making (see chart for details).    Patient presented for evaluation of a 4-day history of productive cough and shortness of breath, particularly while trying to sleep.  No chest pain, palpitations, or lower extremity swelling was reported or noted on physical examination.  Vital signs are reassuring.  A chest x-ray did not demonstrate any focal consolidations or concerns.  Therefore, a short low-dose prednisone  burst has been provided along with Tessalon  Perles to help manage the symptoms.  Red flags were reviewed with the patient which would warrant return evaluation.  Discharge instructions were provided with the use of the [place] video interpreter services [employer], ID number [id number].  Final Clinical Impressions(s) / UC Diagnoses   Final diagnoses:  Acute cough  Shortness of breath     Discharge Instructions      Low dose prednisone  twice daily for five days. Tessalon  Perles sent in for the cough. Seek re-evaluation for onset of chest pain, worsening shortness of breath, or fever.  ED Prescriptions     Medication Sig Dispense Auth. Provider   predniSONE  (DELTASONE ) 20 MG tablet Take 1 tablet (20 mg total) by mouth 2 (two) times daily for 5 days. 10 tablet Janet Therisa PARAS, FNP   benzonatate  (TESSALON ) 200 MG capsule Take 1 capsule (200 mg total) by mouth every 8 (eight) hours as needed for cough. 20 capsule Janet Therisa PARAS, FNP      PDMP not reviewed this encounter.    Janet Therisa PARAS, FNP 10/31/23 [phone]

## 2023-10-31 NOTE — ED Triage Notes (Signed)
 Used nepali interpretor  Pt having difficulty sleeping at night due to SOB that has been going on for 4 days. Pt reports this is when he laying down. Reports does have some SOB during the day but worse at night time.  They tell me my SOB is because of cough and when I have SOB I use this inhaler and I feel better Pt has hx asthma and COPD.   Pt also has a second problem of itchy skin. Right now itchy skin is better. This is my chronic problem. It will go away and come back. I am okay now.

## 2023-11-01 ENCOUNTER — Ambulatory Visit: Payer: Self-pay

## 2023-11-01 NOTE — Telephone Encounter (Signed)
 Please reach out to patient and schedule him

## 2023-11-01 NOTE — Telephone Encounter (Signed)
 FYI Only or Action Required?: Action required by provider: request for appointment.  Patient was last seen in primary care on 09/27/2023 by Newlin, Enobong, MD.  Called Nurse Triage reporting Shortness of Breath.  Symptoms began several days ago.  Interventions attempted: Prescription medications: predinsone, tessalon  pearls.  Symptoms are: gradually improving.  Triage Disposition: Home Care  Patient/caregiver understands and will follow disposition?: No, wishes to speak with PCP  Using [facility] PI#[id number].   Copied from CRM [id number]. Topic: Clinical - Red Word Triage >> Nov 01, 2023  8:58 AM Ivette P wrote: [facility] that prompted transfer to Nurse Triage: Shortness of breath, trouble breathing Reason for Disposition  Mild central chest pain that only occurs when coughing is also present  Answer Assessment - Initial Assessment Questions 1. ONSET: When did the cough begin?      5-6 days 2. SEVERITY: How bad is the cough today?      Mild cough 3. SPUTUM: Describe the color of your sputum (e.g., none, dry cough; clear, white, yellow, green)     Yes, white 4. HEMOPTYSIS: Are you coughing up any blood? If Yes, ask: How much? (e.g., flecks, streaks, tablespoons, etc.)     no 5. DIFFICULTY BREATHING: Are you having difficulty breathing? If Yes, ask: How bad is it? (e.g., mild, moderate, severe)      Better today than yesterday, but he has to sit straight up to sleep  6. FEVER: Do you have a fever? If Yes, ask: What is your temperature, how was it measured, and when did it start?     no 7. CARDIAC HISTORY: Do you have any history of heart disease? (e.g., heart attack, congestive heart failure)      no 8. LUNG HISTORY: Do you have any history of lung disease?  (e.g., pulmonary embolus, asthma, emphysema)     asthma 9. PE RISK FACTORS: Do you have a history of blood clots? (or: recent major surgery, recent prolonged travel, bedridden)     no 10.  OTHER SYMPTOMS: Do you have any other symptoms? (e.g., runny nose, wheezing, chest pain)       Chest soreness from coughing  Protocols used: Cough - Acute Non-Productive-A-AH

## 2023-11-01 NOTE — Telephone Encounter (Signed)
 Pt is requesting a f/uu appt after UC visit yesterday for cough and sob. States SOB is better today but still having to sleep sitting up. States mild chest discomfort only when coughing. No appts available.  Please advise

## 2023-12-04 ENCOUNTER — Other Ambulatory Visit: Payer: Self-pay

## 2023-12-04 ENCOUNTER — Ambulatory Visit (HOSPITAL_COMMUNITY): Admission: EM | Admit: 2023-12-04 | Discharge: 2023-12-04 | Disposition: A | Discharge status: Home / Self Care

## 2023-12-04 ENCOUNTER — Encounter (HOSPITAL_COMMUNITY): Payer: Self-pay | Admitting: *Deleted

## 2023-12-04 ENCOUNTER — Ambulatory Visit (INDEPENDENT_AMBULATORY_CARE_PROVIDER_SITE_OTHER)

## 2023-12-04 DIAGNOSIS — R0602 Shortness of breath: Secondary | ICD-10-CM | POA: Diagnosis not present

## 2023-12-04 DIAGNOSIS — J441 Chronic obstructive pulmonary disease with (acute) exacerbation: Secondary | ICD-10-CM

## 2023-12-04 MED ORDER — METHYLPREDNISOLONE SODIUM SUCC 125 MG IJ SOLR
INTRAMUSCULAR | Status: AC
  Filled 2023-12-04: qty 2

## 2023-12-04 MED ORDER — DOXYCYCLINE HYCLATE 100 MG PO CAPS
100.0000 mg | ORAL_CAPSULE | Freq: Two times a day (BID) | ORAL | 0 refills | Status: AC
Start: 2023-12-04 — End: 2023-12-09
  Filled 2023-12-04: qty 10, 5d supply, fill #0

## 2023-12-04 MED ORDER — IPRATROPIUM-ALBUTEROL 0.5-2.5 (3) MG/3ML IN SOLN
3.0000 mL | Freq: Once | RESPIRATORY_TRACT | Status: AC
  Administered 2023-12-04: 3 mL via RESPIRATORY_TRACT

## 2023-12-04 MED ORDER — PREDNISONE 10 MG PO TABS
40.0000 mg | ORAL_TABLET | Freq: Every day | ORAL | 0 refills | Status: AC
Start: 2023-12-04 — End: 2023-12-09
  Filled 2023-12-04: qty 20, 5d supply, fill #0

## 2023-12-04 MED ORDER — METHYLPREDNISOLONE SODIUM SUCC 125 MG IJ SOLR
80.0000 mg | Freq: Once | INTRAMUSCULAR | Status: AC
  Administered 2023-12-04: 80 mg via INTRAMUSCULAR

## 2023-12-04 MED ORDER — IPRATROPIUM-ALBUTEROL 0.5-2.5 (3) MG/3ML IN SOLN
RESPIRATORY_TRACT | Status: AC
Start: 2023-12-04 — End: 2023-12-04
  Filled 2023-12-04: qty 3

## 2023-12-04 NOTE — ED Provider Notes (Signed)
 [facility]    CSN: [id number] Arrival date & time: 12/04/23  0801      History   Chief Complaint No chief complaint on file.   HPI David Barber is a 73 y.o. male.   HPI  Patient is 73 year old male with a past history of COPD/asthma overlap and presents today with 7-10 days of worsening respiratory difficulty.  Endorses difficulty with sleeping at night due to cough.  Denies any fevers or chills.  Denies any weakness.  Patient's son is with him and is additional historian.  Also used [place] interpreter for additional history taking.  Patient states he frequently has COPD exacerbations and  was here 1 month ago for similar symptoms.  Takes all of his daily medications including Incruse Ellipta  and as needed albuterol .  States he has been having trouble sleeping at night due to shortness of breath.  Does not have supplemental oxygen  at home.  Denies any chest pain.  Denies any focal neurologic symptoms.  Denies any recent travel.  Denies any fever or chills.  Past Medical History:  Diagnosis Date   Allergy    Anxiety    Asthma    Bronchitis    Cataract    COPD (chronic obstructive pulmonary disease) (HCC)    Diabetes mellitus without complication (HCC)    GERD (gastroesophageal reflux disease)    Hyperlipidemia    Hypertension     Patient Active Problem List   Diagnosis Date Noted   Constipation 11/24/2021   Diabetic neuropathy (HCC) 01/22/2018   Hyperlipidemia associated with type 2 diabetes mellitus (HCC) 07/29/2015   Hearing loss 07/29/2015   Vitamin D  deficiency 05/15/2015   Chronic low back pain 02/11/2015   COPD, mild (HCC) 11/17/2014   Chewing tobacco use 11/17/2014   GERD (gastroesophageal reflux disease) 06/18/2014   Nonspecific abnormal electrocardiogram (ECG) (EKG) 06/16/2014   Diabetes type 2, controlled (HCC) 12/21/2012    Past Surgical History:  Procedure Laterality Date   COLONOSCOPY     CYST REMOVAL NECK  03/22/2011   benign    POLYPECTOMY     UPPER GASTROINTESTINAL ENDOSCOPY         Home Medications    Prior to Admission medications   Medication Sig Start Date End Date Taking? Authorizing Provider  albuterol  (VENTOLIN  HFA) 108 (90 Base) MCG/ACT inhaler INHALE 2 PUFFS INTO THE LUNGS EVERY 6 (SIX) HOURS AS NEEDED FOR WHEEZING OR SHORTNESS OF BREATH. 09/27/23  Yes Newlin, Enobong, MD  doxycycline  (VIBRAMYCIN ) 100 MG capsule Take 1 capsule (100 mg total) by mouth 2 (two) times daily for 5 days. 12/04/23 12/09/23 Yes Lynwood Barter, DO  metFORMIN  (GLUCOPHAGE -XR) 500 MG 24 hr tablet Take 1 tablet (500 mg total) by mouth 2 (two) times daily with a meal. 09/27/23  Yes Newlin, Enobong, MD  predniSONE  (DELTASONE ) 10 MG tablet Take 4 tablets (40 mg total) by mouth daily with breakfast for 5 days. 12/04/23 12/09/23 Yes Lynwood Barter, DO  rosuvastatin  (CRESTOR ) 40 MG tablet Take 1 tablet (40 mg total) by mouth daily. 10/03/23  Yes Newlin, Enobong, MD  umeclidinium bromide  (INCRUSE ELLIPTA ) 62.5 MCG/ACT AEPB Inhale 1 puff into the lungs daily. 09/27/23  Yes Newlin, Enobong, MD  benzonatate  (TESSALON ) 200 MG capsule Take 1 capsule (200 mg total) by mouth every 8 (eight) hours as needed for cough. 10/31/23   Janet Therisa PARAS, FNP  cetirizine  (ZYRTEC ) 10 MG tablet Take 1 tablet (10 mg total) by mouth daily. 04/12/23   Danton Jon HERO, PA-C  clotrimazole  (MYCELEX ) 10 MG troche Take 1 tablet (10 mg total) by mouth 5 (five) times daily. 08/12/23   Reddick, Johnathan B, NP  glucose blood (ACCU-CHEK GUIDE) test strip Check blood sugar once a day. 10/12/20   Newlin, Enobong, MD  hydrOXYzine  (ATARAX ) 25 MG tablet Take 1 tablet (25 mg total) by mouth every 8 (eight) hours as needed for itching. 11/02/22   Newlin, Enobong, MD  lidocaine  (XYLOCAINE ) 2 % solution Use as directed 15 mLs in the mouth or throat every 6 (six) hours as needed for mouth pain. 08/12/23   Reddick, Johnathan B, NP  meloxicam  (MOBIC ) 7.5 MG tablet Take 1 tablet (7.5 mg total) by mouth  daily. 11/02/22   Delbert Clam, MD  Misc. Devices MISC Discontinue oxygen  05/06/22   Newlin, Enobong, MD  polyethylene glycol (MIRALAX ) 17 g packet Take 17 g by mouth daily as needed for moderate constipation or mild constipation. 09/15/22   Lamptey, Aleene KIDD, MD  senna-docusate (SENOKOT-S) 8.6-50 MG tablet Take 1 tablet by mouth at bedtime. 09/15/22   LampteyAleene KIDD, MD  terbinafine  (LAMISIL ) 250 MG tablet Take 1 tablet (250 mg total) by mouth daily. 09/15/22   LampteyAleene KIDD, MD  triamcinolone  cream (KENALOG ) 0.1 % Apply 1 Application topically 2 (two) times daily. 04/12/23   Danton Jon HERO, PA-C  atorvastatin  (LIPITOR) 80 MG tablet Take 1 tablet (80 mg total) by mouth daily. 05/27/20 05/29/20  Delbert Clam, MD    Family History Family History  Problem Relation Age of Onset   Colon cancer Neg Hx    Colon polyps Neg Hx    Esophageal cancer Neg Hx    Rectal cancer Neg Hx    Stomach cancer Neg Hx     Social History Social History   Tobacco Use   Smoking status: Former    Current packs/day: 0.00    Types: Cigarettes    Quit date: 10/19/2013    Years since quitting: 10.1   Smokeless tobacco: Current    Types: Chew   Tobacco comments:    chewing tobacco  Vaping Use   Vaping status: Never Used  Substance Use Topics   Alcohol  use: Not Currently   Drug use: No     Allergies   Patient has no known allergies.   Review of Systems Review of Systems  Constitutional:  Negative for chills, diaphoresis and fever.  Respiratory:  Positive for shortness of breath and wheezing. Negative for apnea.   Cardiovascular:  Negative for chest pain and leg swelling.  All other systems reviewed and are negative.    Physical Exam Triage Vital Signs ED Triage Vitals  Encounter Vitals Group     BP 12/04/23 0818 129/78     Girls Systolic BP Percentile --      Girls Diastolic BP Percentile --      Boys Systolic BP Percentile --      Boys Diastolic BP Percentile --      Pulse Rate  12/04/23 0818 93     Resp 12/04/23 0818 20     Temp 12/04/23 0818 (!) 97.5 F (36.4 C)     Temp Source 12/04/23 0818 Oral     SpO2 12/04/23 0818 (!) 88 %     Weight --      Height --      Head Circumference --      Peak Flow --      Pain Score 12/04/23 0815 0     Pain Loc --  Pain Education --      Exclude from Growth Chart --    No data found.  Updated Vital Signs BP 129/78 (BP Location: Left Arm)   Pulse 98   Temp (!) 97.5 F (36.4 C) (Oral)   Resp 20   SpO2 91%   Visual Acuity Right Eye Distance:   Left Eye Distance:   Bilateral Distance:    Right Eye Near:   Left Eye Near:    Bilateral Near:     Physical Exam Vitals and nursing note reviewed.  Constitutional:      General: He is not in acute distress.    Appearance: He is well-developed.  HENT:     Head: Normocephalic and atraumatic.  Eyes:     Conjunctiva/sclera: Conjunctivae normal.  Cardiovascular:     Rate and Rhythm: Normal rate and regular rhythm.     Heart sounds: No murmur heard. Pulmonary:     Effort: Pulmonary effort is normal. No respiratory distress.     Breath sounds: Wheezing (in all lung fields) present.  Abdominal:     Palpations: Abdomen is soft.     Tenderness: There is no abdominal tenderness.  Musculoskeletal:        General: No swelling.     Cervical back: Neck supple.  Skin:    General: Skin is warm and dry.     Capillary Refill: Capillary refill takes less than 2 seconds.  Neurological:     Mental Status: He is alert.  Psychiatric:        Mood and Affect: Mood normal.      UC Treatments / Results  Labs (all labs ordered are listed, but only abnormal results are displayed) Labs Reviewed - No data to display  EKG   Radiology DG Chest 2 View Result Date: 12/04/2023 CLINICAL DATA:  SHORTNESS OF BREATH.  COUGH. EXAM: CHEST - 2 VIEW COMPARISON:  10/31/2023 FINDINGS: The heart size and mediastinal contours are within normal limits. Stable biapical pleural-parenchymal  scarring. Pulmonary hyperinflation again seen, suspicious for COPD. No evidence of acute infiltrate or pleural effusion. The visualized skeletal structures are unremarkable. IMPRESSION: COPD. No active cardiopulmonary disease. Electronically Signed   By: Norleen DELENA Kil M.D.   On: 12/04/2023 09:04    Procedures Procedures (including critical care time)  Medications Ordered in UC Medications  methylPREDNISolone  sodium succinate (SOLU-MEDROL ) 125 mg/2 mL injection 80 mg (80 mg Intramuscular Given 12/04/23 0917)  ipratropium-albuterol  (DUONEB) 0.5-2.5 (3) MG/3ML nebulizer solution 3 mL (3 mLs Nebulization Given 12/04/23 0933)    Initial Impression / Assessment and Plan / UC Course  I have reviewed the triage vital signs and the nursing notes.  Pertinent labs & imaging results that were available during my care of the patient were reviewed by me and considered in my medical decision making (see chart for details).    #COPD/asthma exacerbation Initially placed on 2 L supplemental O2 for comfort breathing.  Patient never desatted to less than 88% while on room air. Treated in UC with 80 mg of Solu-Medrol  IM injection as well as DuoNeb x 1. Chest x-ray is ordered and showed evidence of COPD exam, no  signs of acute bacterial pneumonia on my evaluation.  Will wait for official radiology read. Monitored for 1 hour and never desatted again after treatments. Patient's oxygen  saturation improved to > 92% while on room air Will treat as COPD exacerbation Will send (40 mg) of prednisone  daily for the next 5 days Please take doxycycline  twice daily for  the next 5 days Please follow-up with your primary care physician within 3 days for further management Please go to the emergency room if you continue to have acute shortness of breath  Final Clinical Impressions(s) / UC Diagnoses   Final diagnoses:  Acute exacerbation of COPD with asthma [facility])     Discharge Instructions      Your diagnosis  COPD/asthma exacerbation Please take 4 tablets (40 mg) of prednisone  daily for the next 5 days Please take doxycycline  twice daily for the next 5 days Please follow-up with your primary care physician within 3 days for further management Please go to the emergency room if you continue to have acute shortness of breath    ED Prescriptions     Medication Sig Dispense Auth. Provider   predniSONE  (DELTASONE ) 10 MG tablet Take 4 tablets (40 mg total) by mouth daily with breakfast for 5 days. 20 tablet Lynwood Barter, DO   doxycycline  (VIBRAMYCIN ) 100 MG capsule Take 1 capsule (100 mg total) by mouth 2 (two) times daily for 5 days. 10 capsule Lynwood Barter, DO      PDMP not reviewed this encounter.   Lynwood Barter, DO 12/04/23 1108

## 2023-12-04 NOTE — ED Triage Notes (Signed)
 Professional interpretor used for clinical intake.    Pt states SOB with a lot of coughing, coughing, not able to sleep at night X 7/10 days has got better since it started. Pts son states he has been taking nyquil and he has been using hs inhale. He used it twice this morning.

## 2023-12-04 NOTE — Discharge Instructions (Addendum)
 Your diagnosis COPD/asthma exacerbation Please take 4 tablets (40 mg) of prednisone  daily for the next 5 days Please take doxycycline  twice daily for the next 5 days Please follow-up with your primary care physician within 3 days for further management Please go to the emergency room if you continue to have acute shortness of breath

## 2023-12-11 ENCOUNTER — Encounter: Payer: Self-pay | Admitting: Family Medicine

## 2023-12-11 ENCOUNTER — Ambulatory Visit: Payer: Self-pay | Attending: Family Medicine | Admitting: Family Medicine

## 2023-12-11 ENCOUNTER — Other Ambulatory Visit: Payer: Self-pay

## 2023-12-11 VITALS — BP 111/73 | HR 107 | Ht 66.0 in | Wt 164.0 lb

## 2023-12-11 DIAGNOSIS — Z79899 Other long term (current) drug therapy: Secondary | ICD-10-CM | POA: Insufficient documentation

## 2023-12-11 DIAGNOSIS — H9190 Unspecified hearing loss, unspecified ear: Secondary | ICD-10-CM | POA: Diagnosis not present

## 2023-12-11 DIAGNOSIS — Z7984 Long term (current) use of oral hypoglycemic drugs: Secondary | ICD-10-CM | POA: Insufficient documentation

## 2023-12-11 DIAGNOSIS — J4489 Other specified chronic obstructive pulmonary disease: Secondary | ICD-10-CM | POA: Diagnosis not present

## 2023-12-11 DIAGNOSIS — E119 Type 2 diabetes mellitus without complications: Secondary | ICD-10-CM | POA: Diagnosis not present

## 2023-12-11 DIAGNOSIS — F17220 Nicotine dependence, chewing tobacco, uncomplicated: Secondary | ICD-10-CM | POA: Insufficient documentation

## 2023-12-11 DIAGNOSIS — J449 Chronic obstructive pulmonary disease, unspecified: Secondary | ICD-10-CM

## 2023-12-11 DIAGNOSIS — Z23 Encounter for immunization: Secondary | ICD-10-CM | POA: Diagnosis not present

## 2023-12-11 DIAGNOSIS — H918X9 Other specified hearing loss, unspecified ear: Secondary | ICD-10-CM | POA: Insufficient documentation

## 2023-12-11 DIAGNOSIS — E785 Hyperlipidemia, unspecified: Secondary | ICD-10-CM | POA: Insufficient documentation

## 2023-12-11 DIAGNOSIS — J45909 Unspecified asthma, uncomplicated: Secondary | ICD-10-CM | POA: Diagnosis present

## 2023-12-11 MED ORDER — TRELEGY ELLIPTA 100-62.5-25 MCG/ACT IN AEPB
1.0000 | INHALATION_SPRAY | Freq: Every day | RESPIRATORY_TRACT | 11 refills | Status: AC
  Filled 2023-12-11: qty 60, 30d supply, fill #0
  Filled 2024-01-26: qty 60, 30d supply, fill #1
  Filled 2024-02-28: qty 60, 30d supply, fill #2
  Filled 2024-04-03: qty 60, 30d supply, fill #3

## 2023-12-11 NOTE — Patient Instructions (Signed)

## 2023-12-11 NOTE — Progress Notes (Signed)
 Subjective:  Patient ID: David Barber, male    DOB: Jul 22, 1950  Age: 73 y.o. MRN: [id number]  CC: Asthma (Discuss hearing)     Discussed the use of AI scribe software for clinical note transcription with the patient, who gave verbal consent to proceed.  History of Present Illness Mamoru Takeshita is a 73 year old male with a history of COPD, type 2 diabetes mellitus (A1c 6.2), hyperlipidemia COPD,  here for chronic disease management.tobacco use (chews tobacco), . He is accompanied by his son.  He presents today after urgent care visit for COPD exacerbation.  Checks x-ray revealed no active cardiopulmonary disease. He experiences worsening shortness of breath, especially at night, despite using Incruse and albuterol  inhalers, which provide some improvement. He has required urgent care visits due to these symptoms. He denies smoking but uses chewing tobacco.  He also has difficulty hearing, uncertain if it affects one or both ears, and struggles to hear when called.    Past Medical History:  Diagnosis Date   Allergy    Anxiety    Asthma    Bronchitis    Cataract    COPD (chronic obstructive pulmonary disease) (HCC)    Diabetes mellitus without complication (HCC)    GERD (gastroesophageal reflux disease)    Hyperlipidemia    Hypertension     Past Surgical History:  Procedure Laterality Date   COLONOSCOPY     CYST REMOVAL NECK  03/22/2011   benign   POLYPECTOMY     UPPER GASTROINTESTINAL ENDOSCOPY      Family History  Problem Relation Age of Onset   Colon cancer Neg Hx    Colon polyps Neg Hx    Esophageal cancer Neg Hx    Rectal cancer Neg Hx    Stomach cancer Neg Hx     Social History   Socioeconomic History   Marital status: Married    Spouse name: Not on file   Number of children: Not on file   Years of education: Not on file   Highest education level: Not on file  Occupational History   Not on file  Tobacco Use   Smoking status: Former     Current packs/day: 0.00    Types: Cigarettes    Quit date: 10/19/2013    Years since quitting: 10.1   Smokeless tobacco: Current    Types: Chew   Tobacco comments:    chewing tobacco  Vaping Use   Vaping status: Never Used  Substance and Sexual Activity   Alcohol  use: Not Currently   Drug use: No   Sexual activity: Not Currently  Other Topics Concern   Not on file  Social History Narrative   Not on file   Social Drivers of Health   Financial Resource Strain: Low Risk  (04/28/2022)   Overall Financial Resource Strain (CARDIA)    Difficulty of Paying Living Expenses: Not hard at all  Food Insecurity: No Food Insecurity (04/28/2022)   Hunger Vital Sign    Worried About Running Out of Food in the Last Year: Never true    Ran Out of Food in the Last Year: Never true  Transportation Needs: No Transportation Needs (09/28/2023)   PRAPARE - Administrator, Civil Service (Medical): No    Lack of Transportation (Non-Medical): No  Physical Activity: Sufficiently Active (09/28/2023)   Exercise Vital Sign    Days of Exercise per Week: 7 days    Minutes of Exercise per Session: 60 min  Stress: No Stress Concern Present (04/28/2022)   [employer] of Occupational Health - Occupational Stress Questionnaire    Feeling of Stress : Not at all  Social Connections: Moderately Integrated (09/28/2023)   Social Connection and Isolation Panel    Frequency of Communication with Friends and Family: More than three times a week    Frequency of Social Gatherings with Friends and Family: More than three times a week    Attends Religious Services: 1 to 4 times per year    Active Member of [employer] or Organizations: No    Attends Engineer, structural: Not on file    Marital Status: Married    No Known Allergies  Outpatient Medications Prior to Visit  Medication Sig Dispense Refill   albuterol  (VENTOLIN  HFA) 108 (90 Base) MCG/ACT inhaler INHALE 2 PUFFS INTO THE LUNGS EVERY 6 (SIX) HOURS  AS NEEDED FOR WHEEZING OR SHORTNESS OF BREATH. 36 g 6   cetirizine  (ZYRTEC ) 10 MG tablet Take 1 tablet (10 mg total) by mouth daily. 30 tablet 11   clotrimazole  (MYCELEX ) 10 MG troche Take 1 tablet (10 mg total) by mouth 5 (five) times daily. 70 Troche 0   glucose blood (ACCU-CHEK GUIDE) test strip Check blood sugar once a day. 100 each 2   hydrOXYzine  (ATARAX ) 25 MG tablet Take 1 tablet (25 mg total) by mouth every 8 (eight) hours as needed for itching. 30 tablet 1   lidocaine  (XYLOCAINE ) 2 % solution Use as directed 15 mLs in the mouth or throat every 6 (six) hours as needed for mouth pain. 100 mL 1   meloxicam  (MOBIC ) 7.5 MG tablet Take 1 tablet (7.5 mg total) by mouth daily. 30 tablet 1   metFORMIN  (GLUCOPHAGE -XR) 500 MG 24 hr tablet Take 1 tablet (500 mg total) by mouth 2 (two) times daily with a meal. 180 tablet 1   Misc. Devices MISC Discontinue oxygen  1 each 0   polyethylene glycol (MIRALAX ) 17 g packet Take 17 g by mouth daily as needed for moderate constipation or mild constipation. 30 each 0   rosuvastatin  (CRESTOR ) 40 MG tablet Take 1 tablet (40 mg total) by mouth daily. 90 tablet 1   senna-docusate (SENOKOT-S) 8.6-50 MG tablet Take 1 tablet by mouth at bedtime. 90 tablet 1   terbinafine  (LAMISIL ) 250 MG tablet Take 1 tablet (250 mg total) by mouth daily. 14 tablet 0   triamcinolone  cream (KENALOG ) 0.1 % Apply 1 Application topically 2 (two) times daily. 80 g 1   umeclidinium bromide  (INCRUSE ELLIPTA ) 62.5 MCG/ACT AEPB Inhale 1 puff into the lungs daily. 30 each 6   benzonatate  (TESSALON ) 200 MG capsule Take 1 capsule (200 mg total) by mouth every 8 (eight) hours as needed for cough. (Patient not taking: Reported on 12/11/2023) 20 capsule 0   No facility-administered medications prior to visit.     ROS Review of Systems  Constitutional:  Negative for activity change and appetite change.  HENT:  Negative for sinus pressure and sore throat.   Respiratory:  Positive for shortness of  breath. Negative for chest tightness and wheezing.   Cardiovascular:  Negative for chest pain and palpitations.  Gastrointestinal:  Negative for abdominal distention, abdominal pain and constipation.  Genitourinary: Negative.   Musculoskeletal: Negative.   Psychiatric/Behavioral:  Negative for behavioral problems and dysphoric mood.     Objective:  BP 111/73   Pulse (!) 107   Ht 5' 6 (1.676 m)   Wt 164 lb (74.4 kg)   SpO2  92%   BMI 26.47 kg/m      12/11/2023   10:59 AM 12/04/2023    8:18 AM 10/31/2023    4:25 PM  BP/Weight  Systolic BP 111 129 103  Diastolic BP 73 78 68  Wt. (Lbs) 164    BMI 26.47 kg/m2        Physical Exam Constitutional:      Appearance: He is well-developed.  Cardiovascular:     Rate and Rhythm: Tachycardia present.     Heart sounds: Normal heart sounds. No murmur heard. Pulmonary:     Effort: Pulmonary effort is normal.     Breath sounds: Normal breath sounds. No wheezing or rales.  Chest:     Chest wall: No tenderness.  Abdominal:     General: Bowel sounds are normal. There is no distension.     Palpations: Abdomen is soft. There is no mass.     Tenderness: There is no abdominal tenderness.  Musculoskeletal:        General: Normal range of motion.     Right lower leg: No edema.     Left lower leg: No edema.  Neurological:     Mental Status: He is alert and oriented to person, place, and time.  Psychiatric:        Mood and Affect: Mood normal.        Latest Ref Rng & Units 09/27/2023    9:29 AM 11/02/2022    2:41 PM 11/24/2021    9:13 AM  CMP  Glucose 70 - 99 mg/dL 885  888  895   BUN 8 - 27 mg/dL 9  5  8    Creatinine 0.76 - 1.27 mg/dL 9.18  9.15  9.16   Sodium 134 - 144 mmol/L 142  141  143   Potassium 3.5 - 5.2 mmol/L 4.3  4.0  4.2   Chloride 96 - 106 mmol/L 103  103  106   CO2 20 - 29 mmol/L 20  24  23    Calcium  8.6 - 10.2 mg/dL 9.8  9.5  9.8   Total Protein 6.0 - 8.5 g/dL 7.2  7.5    Total Bilirubin 0.0 - 1.2 mg/dL 0.5  0.4     Alkaline Phos 44 - 121 IU/L 123  122    AST 0 - 40 IU/L 23  28    ALT 0 - 44 IU/L 19  25      Lipid Panel     Component Value Date/Time   CHOL 234 (H) 09/27/2023 0929   TRIG 200 (H) 09/27/2023 0929   HDL 42 09/27/2023 0929   CHOLHDL 3.9 11/24/2021 0913   CHOLHDL 6.5 (H) 07/21/2015 1625   VLDL 61 (H) 07/21/2015 1625   LDLCALC 155 (H) 09/27/2023 0929    CBC    Component Value Date/Time   WBC 8.0 01/23/2019 0140   RBC 5.28 01/23/2019 0140   HGB 14.3 01/23/2019 0140   HCT 44.6 01/23/2019 0140   PLT 149 (L) 01/23/2019 0140   MCV 84.5 01/23/2019 0140   MCH 27.1 01/23/2019 0140   MCHC 32.1 01/23/2019 0140   RDW 15.4 01/23/2019 0140   LYMPHSABS 0.9 01/23/2019 0140   MONOABS 0.2 01/23/2019 0140   EOSABS 0.0 01/23/2019 0140   BASOSABS 0.0 01/23/2019 0140    Lab Results  Component Value Date   HGBA1C 6.5 09/27/2023       Assessment & Plan Chronic Obstructive Pulmonary Disease (COPD) COPD exacerbation with increased nocturnal dyspnea. Current inhaler regimen  includes Incruse and albuterol . Symptoms partially controlled but significant dyspnea persists. Chewing tobacco may contribute to respiratory symptoms. - Switch inhaler from Incruse to Trelegy for improved symptom control. - Advise cessation of chewing tobacco to enhance respiratory health. - Send new inhaler prescription to pharmacy for pickup.  Hearing loss Reports difficulty hearing, uncertain if unilateral or bilateral, with difficulty hearing when called. - Refer for audiological evaluation to determine extent and etiology of hearing loss.  General Health Maintenance Flu vaccination discussed and agreed upon. - Administer influenza vaccine.     Healthcare maintenance Encounter for administration of vaccine-flu shot administered  Meds ordered this encounter  Medications   Fluticasone -Umeclidin-Vilant (TRELEGY ELLIPTA ) 100-62.5-25 MCG/ACT AEPB    Sig: Inhale 1 puff into the lungs daily.    Dispense:  60  each    Refill:  11    Discontinue incruse    Follow-up: Return for previously scheduled appointment.       Corrina Sabin, MD, FAAFP. [facility] [place], [place] [phone]   12/11/2023, 12:06 PM

## 2024-01-09 ENCOUNTER — Other Ambulatory Visit: Payer: Self-pay

## 2024-01-12 ENCOUNTER — Other Ambulatory Visit: Payer: Self-pay

## 2024-01-29 ENCOUNTER — Other Ambulatory Visit: Payer: Self-pay

## 2024-02-28 ENCOUNTER — Other Ambulatory Visit: Payer: Self-pay

## 2024-03-05 ENCOUNTER — Other Ambulatory Visit: Payer: Self-pay

## 2024-04-01 ENCOUNTER — Telehealth: Payer: Self-pay | Admitting: Family Medicine

## 2024-04-01 ENCOUNTER — Ambulatory Visit: Attending: Family Medicine | Admitting: Family Medicine

## 2024-04-01 ENCOUNTER — Encounter: Payer: Self-pay | Admitting: Family Medicine

## 2024-04-01 VITALS — BP 90/61 | HR 102 | Temp 97.8°F | Ht 66.0 in | Wt 158.6 lb

## 2024-04-01 DIAGNOSIS — K5900 Constipation, unspecified: Secondary | ICD-10-CM | POA: Diagnosis present

## 2024-04-01 DIAGNOSIS — E1169 Type 2 diabetes mellitus with other specified complication: Secondary | ICD-10-CM | POA: Insufficient documentation

## 2024-04-01 DIAGNOSIS — E785 Hyperlipidemia, unspecified: Secondary | ICD-10-CM | POA: Diagnosis not present

## 2024-04-01 DIAGNOSIS — R109 Unspecified abdominal pain: Secondary | ICD-10-CM | POA: Diagnosis present

## 2024-04-01 DIAGNOSIS — E119 Type 2 diabetes mellitus without complications: Secondary | ICD-10-CM

## 2024-04-01 DIAGNOSIS — K5909 Other constipation: Secondary | ICD-10-CM | POA: Diagnosis not present

## 2024-04-01 DIAGNOSIS — F17220 Nicotine dependence, chewing tobacco, uncomplicated: Secondary | ICD-10-CM | POA: Diagnosis not present

## 2024-04-01 DIAGNOSIS — Z79899 Other long term (current) drug therapy: Secondary | ICD-10-CM | POA: Diagnosis not present

## 2024-04-01 DIAGNOSIS — J449 Chronic obstructive pulmonary disease, unspecified: Secondary | ICD-10-CM | POA: Insufficient documentation

## 2024-04-01 DIAGNOSIS — Z7984 Long term (current) use of oral hypoglycemic drugs: Secondary | ICD-10-CM | POA: Insufficient documentation

## 2024-04-01 LAB — POCT GLYCOSYLATED HEMOGLOBIN (HGB A1C): HbA1c, POC (controlled diabetic range): 6.3 % (ref 0.0–7.0)

## 2024-04-01 MED ORDER — POLYETHYLENE GLYCOL 3350 17 G PO PACK: 17.0000 g | PACK | Freq: Every day | ORAL | 1 refills | Status: AC | PRN

## 2024-04-01 MED ORDER — METFORMIN HCL ER 500 MG PO TB24: 500.0000 mg | ORAL_TABLET | Freq: Two times a day (BID) | ORAL | 1 refills | Status: AC

## 2024-04-01 NOTE — Telephone Encounter (Signed)
 He is requesting increase in hours fr PCS services. There has been no change in medical condition and son cites increase in age as the indication. Thanks

## 2024-04-01 NOTE — Progress Notes (Signed)
 "  Subjective:  Patient ID: David Barber, male    DOB: 07/17/1950  Age: 74 y.o. MRN: [id number]  CC: Medical Management of Chronic Issues (Abdominal pain and constipation/Discuss increase in home health hours/)     Discussed the use of AI scribe software for clinical note transcription with the patient, who gave verbal consent to proceed.  History of Present Illness David Barber is a 74 year old male with diabetes and COPD who presents with constipation and abdominal pain.  He reports constipation with abdominal pain. Bowel movements occur every four to five days, sometimes up to a week, and are small in volume.  He has diabetes and A1c is 6.3 today.  He has COPD and uses inhalers at home. Symptoms are controlled on current regimen.  His cholesterol was previously high. Endorses adherence with his statin. He receives home care services for two to three hours per day.  There is a request for increased hours due to his need for more assistance with aging-related issues. Son accompanies him to this visit and is making the request on his behalf.    Past Medical History:  Diagnosis Date   Allergy    Anxiety    Asthma    Bronchitis    Cataract    COPD (chronic obstructive pulmonary disease) (HCC)    Diabetes mellitus without complication (HCC)    GERD (gastroesophageal reflux disease)    Hyperlipidemia    Hypertension     Past Surgical History:  Procedure Laterality Date   COLONOSCOPY     CYST REMOVAL NECK  03/22/2011   benign   POLYPECTOMY     UPPER GASTROINTESTINAL ENDOSCOPY      Family History  Problem Relation Age of Onset   Colon cancer Neg Hx    Colon polyps Neg Hx    Esophageal cancer Neg Hx    Rectal cancer Neg Hx    Stomach cancer Neg Hx     Social History   Socioeconomic History   Marital status: Married    Spouse name: Not on file   Number of children: Not on file   Years of education: Not on file   Highest education level: Not on file   Occupational History   Not on file  Tobacco Use   Smoking status: Former    Current packs/day: 0.00    Types: Cigarettes    Quit date: 10/19/2013    Years since quitting: 10.4   Smokeless tobacco: Current    Types: Chew   Tobacco comments:    chewing tobacco  Vaping Use   Vaping status: Never Used  Substance and Sexual Activity   Alcohol  use: Not Currently   Drug use: No   Sexual activity: Not Currently  Other Topics Concern   Not on file  Social History Narrative   Not on file   Social Drivers of Health   Tobacco Use: High Risk (04/01/2024)   Patient History    Smoking Tobacco Use: Former    Smokeless Tobacco Use: Current    Passive Exposure: Not on Actuary Strain: Low Risk (04/28/2022)   Overall Financial Resource Strain (CARDIA)    Difficulty of Paying Living Expenses: Not hard at all  Food Insecurity: No Food Insecurity (04/28/2022)   Hunger Vital Sign    Worried About Running Out of Food in the Last Year: Never true    Ran Out of Food in the Last Year: Never true  Transportation Needs: No Transportation Needs (  09/28/2023)   Epic    Lack of Transportation (Medical): No    Lack of Transportation (Non-Medical): No  Physical Activity: Sufficiently Active (09/28/2023)   Exercise Vital Sign    Days of Exercise per Week: 7 days    Minutes of Exercise per Session: 60 min  Stress: No Stress Concern Present (04/28/2022)   [employer] of Occupational Health - Occupational Stress Questionnaire    Feeling of Stress : Not at all  Social Connections: Moderately Integrated (09/28/2023)   Social Connection and Isolation Panel    Frequency of Communication with Friends and Family: More than three times a week    Frequency of Social Gatherings with Friends and Family: More than three times a week    Attends Religious Services: 1 to 4 times per year    Active Member of [employer] or Organizations: No    Attends Banker Meetings: Not on file    Marital  Status: Married  Depression (PHQ2-9): Low Risk (09/27/2023)   Depression (PHQ2-9)    PHQ-2 Score: 1  Alcohol  Screen: Low Risk (09/28/2023)   Alcohol  Screen    Last Alcohol  Screening Score (AUDIT): 0  Housing: Low Risk (09/28/2023)   Epic    Unable to Pay for Housing in the Last Year: No    Number of Times Moved in the Last Year: 0    Homeless in the Last Year: No  Utilities: Not At Risk (09/28/2023)   Epic    Threatened with loss of utilities: No  Health Literacy: Inadequate Health Literacy (09/28/2023)   B1300 Health Literacy    Frequency of need for help with medical instructions: Always    Allergies[1]  Outpatient Medications Prior to Visit  Medication Sig Dispense Refill   albuterol  (VENTOLIN  HFA) 108 (90 Base) MCG/ACT inhaler INHALE 2 PUFFS INTO THE LUNGS EVERY 6 (SIX) HOURS AS NEEDED FOR WHEEZING OR SHORTNESS OF BREATH. 36 g 6   cetirizine  (ZYRTEC ) 10 MG tablet Take 1 tablet (10 mg total) by mouth daily. 30 tablet 11   clotrimazole  (MYCELEX ) 10 MG troche Take 1 tablet (10 mg total) by mouth 5 (five) times daily. 70 Troche 0   Fluticasone -Umeclidin-Vilant (TRELEGY ELLIPTA ) 100-62.5-25 MCG/ACT AEPB Inhale 1 puff into the lungs daily. 60 each 11   glucose blood (ACCU-CHEK GUIDE) test strip Check blood sugar once a day. 100 each 2   hydrOXYzine  (ATARAX ) 25 MG tablet Take 1 tablet (25 mg total) by mouth every 8 (eight) hours as needed for itching. 30 tablet 1   lidocaine  (XYLOCAINE ) 2 % solution Use as directed 15 mLs in the mouth or throat every 6 (six) hours as needed for mouth pain. 100 mL 1   Misc. Devices MISC Discontinue oxygen  1 each 0   rosuvastatin  (CRESTOR ) 40 MG tablet Take 1 tablet (40 mg total) by mouth daily. 90 tablet 1   senna-docusate (SENOKOT-S) 8.6-50 MG tablet Take 1 tablet by mouth at bedtime. 90 tablet 1   terbinafine  (LAMISIL ) 250 MG tablet Take 1 tablet (250 mg total) by mouth daily. 14 tablet 0   triamcinolone  cream (KENALOG ) 0.1 % Apply 1 Application topically  2 (two) times daily. 80 g 1   metFORMIN  (GLUCOPHAGE -XR) 500 MG 24 hr tablet Take 1 tablet (500 mg total) by mouth 2 (two) times daily with a meal. 180 tablet 1   polyethylene glycol (MIRALAX ) 17 g packet Take 17 g by mouth daily as needed for moderate constipation or mild constipation. 30 each 0   benzonatate  (  TESSALON ) 200 MG capsule Take 1 capsule (200 mg total) by mouth every 8 (eight) hours as needed for cough. (Patient not taking: Reported on 04/01/2024) 20 capsule 0   meloxicam  (MOBIC ) 7.5 MG tablet Take 1 tablet (7.5 mg total) by mouth daily. (Patient not taking: Reported on 04/01/2024) 30 tablet 1   No facility-administered medications prior to visit.     ROS Review of Systems  Constitutional:  Negative for activity change and appetite change.  HENT:  Negative for sinus pressure and sore throat.   Respiratory:  Negative for chest tightness, shortness of breath and wheezing.   Cardiovascular:  Negative for chest pain and palpitations.  Gastrointestinal:  Positive for abdominal pain and constipation. Negative for abdominal distention.  Genitourinary: Negative.   Musculoskeletal: Negative.   Psychiatric/Behavioral:  Negative for behavioral problems and dysphoric mood.     Objective:  BP 90/61   Pulse (!) 102   Temp 97.8 F (36.6 C) (Oral)   Ht 5' 6 (1.676 m)   Wt 158 lb 9.6 oz (71.9 kg)   SpO2 95%   BMI 25.60 kg/m      04/01/2024    8:46 AM 12/11/2023   10:59 AM 12/04/2023    8:18 AM  BP/Weight  Systolic BP 90 111 129  Diastolic BP 61 73 78  Wt. (Lbs) 158.6 164   BMI 25.6 kg/m2 26.47 kg/m2       Physical Exam Constitutional:      Appearance: He is well-developed.  Cardiovascular:     Rate and Rhythm: Tachycardia present.     Heart sounds: Normal heart sounds. No murmur heard. Pulmonary:     Effort: Pulmonary effort is normal.     Breath sounds: Normal breath sounds. No wheezing or rales.  Chest:     Chest wall: No tenderness.  Abdominal:     General: Bowel  sounds are normal. There is no distension.     Palpations: Abdomen is soft. There is no mass.     Tenderness: There is no abdominal tenderness.  Musculoskeletal:        General: Normal range of motion.     Right lower leg: No edema.     Left lower leg: No edema.  Neurological:     Mental Status: He is alert and oriented to person, place, and time.  Psychiatric:        Mood and Affect: Mood normal.    Diabetic Foot Exam - Simple   Simple Foot Form Diabetic Foot exam was performed with the following findings: Yes 04/01/2024  8:58 AM  Visual Inspection No deformities, no ulcerations, no other skin breakdown bilaterally: Yes Sensation Testing Intact to touch and monofilament testing bilaterally: Yes Pulse Check Posterior Tibialis and Dorsalis pulse intact bilaterally: Yes Comments        Latest Ref Rng & Units 09/27/2023    9:29 AM 11/02/2022    2:41 PM 11/24/2021    9:13 AM  CMP  Glucose 70 - 99 mg/dL 885  888  895   BUN 8 - 27 mg/dL 9  5  8    Creatinine 0.76 - 1.27 mg/dL 9.18  9.15  9.16   Sodium 134 - 144 mmol/L 142  141  143   Potassium 3.5 - 5.2 mmol/L 4.3  4.0  4.2   Chloride 96 - 106 mmol/L 103  103  106   CO2 20 - 29 mmol/L 20  24  23    Calcium  8.6 - 10.2 mg/dL 9.8  9.5  9.8   Total Protein 6.0 - 8.5 g/dL 7.2  7.5    Total Bilirubin 0.0 - 1.2 mg/dL 0.5  0.4    Alkaline Phos 44 - 121 IU/L 123  122    AST 0 - 40 IU/L 23  28    ALT 0 - 44 IU/L 19  25      Lipid Panel     Component Value Date/Time   CHOL 234 (H) 09/27/2023 0929   TRIG 200 (H) 09/27/2023 0929   HDL 42 09/27/2023 0929   CHOLHDL 3.9 11/24/2021 0913   CHOLHDL 6.5 (H) 07/21/2015 1625   VLDL 61 (H) 07/21/2015 1625   LDLCALC 155 (H) 09/27/2023 0929    CBC    Component Value Date/Time   WBC 8.0 01/23/2019 0140   RBC 5.28 01/23/2019 0140   HGB 14.3 01/23/2019 0140   HCT 44.6 01/23/2019 0140   PLT 149 (L) 01/23/2019 0140   MCV 84.5 01/23/2019 0140   MCH 27.1 01/23/2019 0140   MCHC 32.1 01/23/2019  0140   RDW 15.4 01/23/2019 0140   LYMPHSABS 0.9 01/23/2019 0140   MONOABS 0.2 01/23/2019 0140   EOSABS 0.0 01/23/2019 0140   BASOSABS 0.0 01/23/2019 0140    Lab Results  Component Value Date   HGBA1C 6.3 04/01/2024       Assessment & Plan Constipation Chronic constipation with infrequent bowel movements and associated abdominal pain. - Prescribed Miralax . - Advised increasing dietary fiber intake.  Type 2 diabetes mellitus Controlled with A1c of 6.3 Continue Metformin  -Counseled on Diabetic diet, the healthy plate, 849 minutes of moderate intensity exercise/week Blood sugar logs with fasting goals of 80-120 mg/dl, random of less than 819 and in the event of sugars less than 60 mg/dl or greater than 599 mg/dl encouraged to notify the clinic. Advised on the need for annual eye exams, annual foot exams, Pneumonia vaccine.  Hyperlipidemia associated with Type 2 DM -Uncontrolled - Ordered lipid panel and comprehensive metabolic panel. - Instructed to return for fasting blood test -Low cholesterol diet  Chronic obstructive pulmonary disease COPD well-managed with current inhaler use. - Continue current inhaler regimen.       Meds ordered this encounter  Medications   polyethylene glycol (MIRALAX ) 17 g packet    Sig: Take 17 g by mouth daily as needed for moderate constipation or mild constipation.    Dispense:  30 each    Refill:  1   metFORMIN  (GLUCOPHAGE -XR) 500 MG 24 hr tablet    Sig: Take 1 tablet (500 mg total) by mouth 2 (two) times daily with a meal.    Dispense:  180 tablet    Refill:  1    Follow-up: Return in about 6 months (around 09/29/2024).       Corrina Sabin, MD, FAAFP. [facility] [place], [place] [phone]   04/01/2024, 9:42 AM     [1] No Known Allergies  "

## 2024-04-01 NOTE — Telephone Encounter (Signed)
 I tried to reach the patient's son, David Barber to discuss the request for an increase in PCS hours for his father. With assistance of Nepali interpreter: [phone]/[employer] I called his son: [phone] and we had to leave a message requesting a call back.  The voicemail recording on the son's phone was in Spanish.  We also placed  a call to : [phone] and had to leave a message requesting a call back.

## 2024-04-01 NOTE — Patient Instructions (Addendum)
 Placed in [facility]   [address] [place], [place]  [address] Ph# [phone]     Constipation, Adult Constipation is when a person has fewer than three bowel movements in a week, has difficulty having a bowel movement, or has stools (feces) that are dry, hard, or larger than normal. Constipation may be caused by an underlying condition. It may become worse with age if a person takes certain medicines and does not take in enough fluids. Follow these instructions at home: Eating and drinking  Eat foods that have a lot of fiber, such as beans, whole grains, and fresh fruits and vegetables. Limit foods that are low in fiber and high in fat and processed sugars, such as fried or sweet foods. These include french fries, hamburgers, cookies, candies, and soda. Drink enough fluid to keep your urine pale yellow. General instructions Exercise regularly or as told by your health care provider. Try to do 150 minutes of moderate exercise each week. Use the bathroom when you have the urge to go. Do not hold it in. Take over-the-counter and prescription medicines only as told by your health care provider. This includes any fiber supplements. During bowel movements: Practice deep breathing while relaxing the lower abdomen. Practice pelvic floor relaxation. Watch your condition for any changes. Let your health care provider know about them. Keep all follow-up visits as told by your health care provider. This is important. Contact a health care provider if: You have pain that gets worse. You have a fever. You do not have a bowel movement after 4 days. You vomit. You are not hungry or you lose weight. You are bleeding from the opening between the buttocks (anus). You have thin, pencil-like stools. Get help right away if: You have a fever and your symptoms suddenly get worse. You leak stool or have blood in your stool. Your abdomen is bloated. You have severe pain in your  abdomen. You feel dizzy or you faint. Summary Constipation is when a person has fewer than three bowel movements in a week, has difficulty having a bowel movement, or has stools (feces) that are dry, hard, or larger than normal. Eat foods that have a lot of fiber, such as beans, whole grains, and fresh fruits and vegetables. Drink enough fluid to keep your urine pale yellow. Take over-the-counter and prescription medicines only as told by your health care provider. This includes any fiber supplements. This information is not intended to replace advice given to you by your health care provider. Make sure you discuss any questions you have with your health care provider. Document Revised: 01/19/2022 Document Reviewed: 01/19/2022 Elsevier Patient Education  2024 [employer].

## 2024-04-02 ENCOUNTER — Ambulatory Visit: Attending: Family Medicine

## 2024-04-02 DIAGNOSIS — E119 Type 2 diabetes mellitus without complications: Secondary | ICD-10-CM

## 2024-04-03 NOTE — Telephone Encounter (Signed)
 I tried to reach the patient's son, David Barber again to discuss the request for an increase in PCS hours for his father. With assistance of Nepali interpreter: [id number]/[employer] I called his son: [phone]. The phone call was picked up and no one spoke and then they hung up.  We called twice more and had to leave a message requesting a call back.    We also called : [phone] and left a message requesting a call back.

## 2024-04-04 ENCOUNTER — Other Ambulatory Visit: Payer: Self-pay

## 2024-04-06 LAB — LP+NON-HDL CHOLESTEROL
Cholesterol, Total: 110 mg/dL (ref 100–199)
HDL: 38 mg/dL — ABNORMAL LOW
LDL Chol Calc (NIH): 48 mg/dL (ref 0–99)
Total Non-HDL-Chol (LDL+VLDL): 72 mg/dL (ref 0–129)
Triglycerides: 139 mg/dL (ref 0–149)
VLDL Cholesterol Cal: 24 mg/dL (ref 5–40)

## 2024-04-06 LAB — CMP14+EGFR
ALT: 20 IU/L (ref 0–44)
AST: 25 IU/L (ref 0–40)
Albumin: 4.3 g/dL (ref 3.8–4.8)
Alkaline Phosphatase: 126 IU/L — ABNORMAL HIGH (ref 47–123)
BUN/Creatinine Ratio: 13 (ref 10–24)
BUN: 12 mg/dL (ref 8–27)
Bilirubin Total: 0.3 mg/dL (ref 0.0–1.2)
CO2: 26 mmol/L (ref 20–29)
Calcium: 9.5 mg/dL (ref 8.6–10.2)
Chloride: 102 mmol/L (ref 96–106)
Creatinine, Ser: 0.94 mg/dL (ref 0.76–1.27)
Globulin, Total: 2.9 g/dL (ref 1.5–4.5)
Glucose: 86 mg/dL (ref 70–99)
Potassium: 4.5 mmol/L (ref 3.5–5.2)
Sodium: 141 mmol/L (ref 134–144)
Total Protein: 7.2 g/dL (ref 6.0–8.5)
eGFR: 85 mL/min/1.73

## 2024-04-06 LAB — MICROALBUMIN / CREATININE URINE RATIO

## 2024-04-06 LAB — SPECIMEN STATUS REPORT

## 2024-04-08 ENCOUNTER — Ambulatory Visit: Payer: Self-pay | Admitting: Family Medicine

## 2024-04-08 NOTE — Telephone Encounter (Signed)
 I spoke to patient's son, David Barber, with the assistance of Nepali interpreter: [id number]/[employer] and explained that to qualify for additional PCS hours we would need to be able to document a change in his medical status and at this time Dr Delbert does not feel that there has been a change. David Barber said okay.  His father currently receives 45 hours/month of PCS   I told him to please contact this office if he notices a change in  his father's status and he verbalized understanding.

## 2024-04-09 ENCOUNTER — Telehealth: Payer: Self-pay | Admitting: Audiologist

## 2024-04-09 NOTE — Telephone Encounter (Signed)
 Provider will not be in the office on day of hearing test appointment. Please call audiology at [phone] to reschedule your hearing test.

## 2024-04-15 ENCOUNTER — Ambulatory Visit: Admitting: Audiologist

## 2024-04-24 ENCOUNTER — Other Ambulatory Visit: Payer: Self-pay

## 2024-09-30 ENCOUNTER — Ambulatory Visit: Payer: Self-pay | Admitting: Family Medicine
